# Patient Record
Sex: Female | Born: 1980
Health system: Southern US, Community
[De-identification: ages and names within clinical notes are randomized; demographics above are authoritative.]

## PROBLEM LIST (undated history)

## (undated) DIAGNOSIS — G40909 Epilepsy, unspecified, not intractable, without status epilepticus: Secondary | ICD-10-CM

## (undated) DIAGNOSIS — E785 Hyperlipidemia, unspecified: Secondary | ICD-10-CM

## (undated) DIAGNOSIS — F840 Autistic disorder: Secondary | ICD-10-CM

## (undated) DIAGNOSIS — R569 Unspecified convulsions: Secondary | ICD-10-CM

## (undated) DIAGNOSIS — K649 Unspecified hemorrhoids: Secondary | ICD-10-CM

## (undated) HISTORY — DX: Unspecified hemorrhoids: K64.9

## (undated) HISTORY — DX: Hyperlipidemia, unspecified: E78.5

## (undated) HISTORY — DX: Unspecified convulsions: R56.9

## (undated) HISTORY — DX: Autistic disorder: F84.0

## (undated) HISTORY — PX: NO PAST SURGERIES: SHX2092

## (undated) HISTORY — DX: Epilepsy, unspecified, not intractable, without status epilepticus: G40.909

---

## 2004-02-12 ENCOUNTER — Emergency Department: Payer: Self-pay | Admitting: Unknown Physician Specialty

## 2006-02-11 ENCOUNTER — Emergency Department: Payer: Self-pay | Admitting: Emergency Medicine

## 2007-08-20 DIAGNOSIS — J309 Allergic rhinitis, unspecified: Secondary | ICD-10-CM | POA: Insufficient documentation

## 2008-07-10 DIAGNOSIS — N946 Dysmenorrhea, unspecified: Secondary | ICD-10-CM | POA: Insufficient documentation

## 2009-12-31 ENCOUNTER — Ambulatory Visit: Payer: Self-pay | Admitting: Oncology

## 2010-01-22 ENCOUNTER — Ambulatory Visit: Payer: Self-pay | Admitting: Oncology

## 2010-02-21 ENCOUNTER — Ambulatory Visit: Payer: Self-pay | Admitting: Oncology

## 2010-04-04 ENCOUNTER — Ambulatory Visit: Payer: Self-pay | Admitting: Oncology

## 2010-04-24 ENCOUNTER — Ambulatory Visit: Payer: Self-pay | Admitting: Oncology

## 2010-07-11 ENCOUNTER — Ambulatory Visit: Payer: Self-pay | Admitting: Oncology

## 2010-07-23 ENCOUNTER — Ambulatory Visit: Payer: Self-pay | Admitting: Oncology

## 2010-11-26 ENCOUNTER — Ambulatory Visit: Payer: Self-pay | Admitting: Family Medicine

## 2010-12-27 ENCOUNTER — Ambulatory Visit: Payer: Self-pay | Admitting: Family Medicine

## 2012-06-23 ENCOUNTER — Other Ambulatory Visit: Payer: Self-pay

## 2012-06-23 DIAGNOSIS — G40209 Localization-related (focal) (partial) symptomatic epilepsy and epileptic syndromes with complex partial seizures, not intractable, without status epilepticus: Secondary | ICD-10-CM

## 2012-06-23 MED ORDER — ACETAZOLAMIDE 250 MG PO TABS: ORAL_TABLET | ORAL | Status: DC

## 2012-12-20 ENCOUNTER — Other Ambulatory Visit: Payer: Self-pay

## 2012-12-20 DIAGNOSIS — G40209 Localization-related (focal) (partial) symptomatic epilepsy and epileptic syndromes with complex partial seizures, not intractable, without status epilepticus: Secondary | ICD-10-CM

## 2012-12-20 MED ORDER — ACETAZOLAMIDE 250 MG PO TABS: ORAL_TABLET | ORAL | Status: DC

## 2012-12-22 ENCOUNTER — Telehealth: Payer: Self-pay

## 2012-12-22 NOTE — Telephone Encounter (Signed)
I called and recommended that she have a CBC, ALT and Tegretol level (drawn as trough level). TG

## 2012-12-22 NOTE — Telephone Encounter (Signed)
Sherri, RN with [facility] called and lvm stating that pt has an appt in our office on 12/30/12 and an appt with another provider on 12/28/12. She said that the other provider will be ordering labs. Wants to know if our provider will need labs as well.She would like to have it all done at the same time. Please cal Sherri at [phone].

## 2012-12-30 ENCOUNTER — Ambulatory Visit: Payer: Self-pay | Admitting: Family

## 2013-01-04 ENCOUNTER — Ambulatory Visit: Payer: Self-pay | Admitting: Family

## 2013-01-10 ENCOUNTER — Ambulatory Visit (INDEPENDENT_AMBULATORY_CARE_PROVIDER_SITE_OTHER): Payer: Medicaid Other | Admitting: Family

## 2013-01-10 ENCOUNTER — Encounter: Payer: Self-pay | Admitting: Family

## 2013-01-10 VITALS — BP 120/72 | HR 80 | Ht 67.0 in | Wt 154.0 lb

## 2013-01-10 DIAGNOSIS — G40209 Localization-related (focal) (partial) symptomatic epilepsy and epileptic syndromes with complex partial seizures, not intractable, without status epilepticus: Secondary | ICD-10-CM | POA: Insufficient documentation

## 2013-01-10 DIAGNOSIS — Z79899 Other long term (current) drug therapy: Secondary | ICD-10-CM

## 2013-01-10 DIAGNOSIS — F840 Autistic disorder: Secondary | ICD-10-CM | POA: Insufficient documentation

## 2013-01-10 NOTE — Patient Instructions (Signed)
Continue your medications without change.  I will mail a letter to you about Janese's movements and behavior.  Return for follow up in 1 year or sooner if needed.

## 2013-01-10 NOTE — Progress Notes (Signed)
Patient: Joanna Robinson MRN: [id number] Sex: female DOB: 1980-06-06  Provider: Elveria Rising, NP Location of Care: [facility]  Note type: Routine return visit  History of Present Illness: Referral Source: Dr. Lorie Phenix History from: mother and caregivers Chief Complaint: Seizures/Autism  Joanna Robinson is a 32 y.o. female with undifferentiated autism, and history of complex partial seizures who lives in a group home. She is seen yearly for ongoing evaluation of her condition. Major concerns in the past have been a need to perform activities at her pace which often does not conform with needs of the social groups around her. Her behavior is entirely self-directed.  Her mother is concerned because she is going through a phase of being very slow in all her activities. Her mother says that she is having tics and points out sudden flapping movements of her hands, arms and head. The movements look more like stereotypies than tics.  Since she was last seen, she has been physically healthy. She has about 3 brief seizures per year, usually associated with her menstrual cycle.   Review of Systems: 12 system review was remarkable for weight gain, weight loss, swelling in legs, constipation, easy brusing and seizure  Past Medical History  Diagnosis Date  . Seizures    Hospitalizations: no, Head Injury: no, Nervous System Infections: no, Immunizations up to date: yes Past Medical History Comments: She had pneumonia in September 2012  Surgical History No past surgical history on file.   Family History family history includes Alzheimer's disease in her maternal grandmother and paternal grandfather. Family History is negative migraines, seizures, cognitive impairment, blindness, deafness, birth defects, chromosomal disorder, autism.  Social History History   Social History  . Marital Status: Single    Spouse Name: N/A    Number of Children: N/A  . Years of  Education: N/A   Social History Main Topics  . Smoking status: Never Smoker   . Smokeless tobacco: Never Used  . Alcohol Use: No  . Drug Use: No  . Sexual Activity: No   Other Topics Concern  . None   Social History Narrative  . None   Educational level special education Occupation: Day program Living with [employer] comments [place] graduated from [employer], she currently attends [employer].  No Known Allergies  Physical Exam BP 120/72  Pulse 80  Ht 5\' 7"  (1.702 m)  Wt 154 lb (69.854 kg)  BMI 24.11 kg/m2 General: alert, well developed, well nourished female, in no acute distress. She intermittently tossed her head, flapped her arms and hands throughout the visit. Head: normocephalic, no dysmorphic features, Ears, Nose and Throat: Pharynx: oropharynx is pink without exudates or tonsillar hypertrophy. Neck: supple, full range of motion, no cranial or cervical bruits Respiratory: auscultation clear Cardiovascular: no murmurs, pulses are normal Musculoskeletal: no skeletal deformities or apparent scoliosis Skin: no rashes or neurocutaneous lesions  Neurologic Exam  Mental Status: alert; oriented to person; knowledge is  below normal for age; language is pragmatic and somewhat echolalic. She is able to convey her thoughts.  She will repeat a phrase over over again until she has acknowledged. Cranial Nerves: visual fields are full to double simultaneous stimuli; extraocular movements are full and conjugate; pupils are round reactive to light; funduscopic examination shows sharp disc margins with normal vessels; symmetric facial strength; midline tongue and uvula; hearing is functionally normal Motor: Normal strength, tone, and mass; good fine motor movements Sensory: withdrawal x 4 Coordination: had  difficulty following instructions but coordination seems adequate.  Gait and Station: normal gait and station. She is slightly stiff in her  posture, but no evidence of hemiparesis or circumduction. Balance is adequate Reflexes: symmetric and diminished bilaterally; no clonus, toes downgoing   Assessment and Plan Joanna Robinson is a 32 year old young woman with undifferentiated autism, and history of complex partial seizures who lives in a group home. Her mother is concerned because her behavior is slow and deliberate. She has tasks to complete at the group home, and her mother asked for a letter to give to the caregivers there to explain that Joanna Robinson's autism may dictate the speed at which she completes activities, at a pace of her choosing. I told her mother that I would write the letter and mail it to her.  Levada will return for follow up in one year or sooner if needed.

## 2013-01-11 ENCOUNTER — Telehealth: Payer: Self-pay | Admitting: *Deleted

## 2013-01-11 NOTE — Telephone Encounter (Signed)
Noted, I agree with this plan.  This was discussed with Joanna Robinson prior to my leaving.

## 2013-01-11 NOTE — Telephone Encounter (Signed)
[name] the patient's mom called and stated that Dr. Leanna Battles informed her that Joanna Robinson's Tegretol level was high and it was higher than he thought it should be, also Dr. Ave Filter increased her Luvox because the patient had been moving really slow and Dr. Ave Filter felt the increase would help, mom is concerned and would like to know if the increase in Luvox could have caused the Tegretol level to be high? Mom can be reached at work [phone] she states to let them know that you're calling from her daughter's doctor's office and they will pull her out of class. I called mom and her job stated she was off campus until 11:20 am       Thanks,  Marcelino Duster B.

## 2013-01-11 NOTE — Telephone Encounter (Signed)
I spoke with Joanna Robinson and informed her per Dr. Sharene Skeans that we have not seen any labs come over from Dr. Santiago Bur office and that he can not make a decision until seeing what her actual levels are, he also stated that if her Tegretol level is not a 15 or greater or if she's no having unsteady gait, vomiting or double vision, which her mom confirmed that she was not having any of those symptoms, that he would not make any changes and also that the increase in Luvox would not cause a high Tegretol level, and if the level is 15 or higher once I receive a copy from Dr. Posey Boyer office to get with Dr. Merri Brunette about it, mom confirmed understanding and agreed with this plan of care. I have just received the labs from Dr. Santiago Bur office and the Tegretol level is 12.3, this will be in Dr. Darl Householder bin in his office for his review once he returns.          Thanks,  Belenda Cruise.

## 2013-01-18 ENCOUNTER — Encounter: Payer: Self-pay | Admitting: Family

## 2013-01-20 ENCOUNTER — Other Ambulatory Visit: Payer: Self-pay

## 2013-01-20 DIAGNOSIS — G40209 Localization-related (focal) (partial) symptomatic epilepsy and epileptic syndromes with complex partial seizures, not intractable, without status epilepticus: Secondary | ICD-10-CM

## 2013-01-20 MED ORDER — CARBAMAZEPINE ER 300 MG PO CP12: 300.0000 mg | ORAL_CAPSULE | Freq: Two times a day (BID) | ORAL | Status: DC

## 2013-01-20 MED ORDER — ACETAZOLAMIDE 250 MG PO TABS: ORAL_TABLET | ORAL | Status: DC

## 2013-01-20 MED ORDER — CARBAMAZEPINE ER 200 MG PO CP12: 200.0000 mg | ORAL_CAPSULE | Freq: Two times a day (BID) | ORAL | Status: DC

## 2013-01-31 ENCOUNTER — Telehealth: Payer: Self-pay

## 2013-01-31 NOTE — Telephone Encounter (Signed)
I called [name] and let her know the fax was sent as requested.

## 2013-01-31 NOTE — Telephone Encounter (Signed)
[name], mom lvm stating that she spoke with Inetta Fermo this morning regarding a letter. She asked that the letter be faxed to her attention, Sharion Dove, at [phone]. She would also like a call letting her know when this is faxed [phone]

## 2013-01-31 NOTE — Telephone Encounter (Signed)
Please let Mom know that the letter has been faxed. Thanks, Inetta Fermo

## 2013-03-06 IMAGING — CR DG CHEST 2V
1 series · 2 of 2 positions shown · non-contrast
Comparison: none

REASON FOR EXAM: cough
COMMENTS:

PROCEDURE:     KDR - KDXR CHEST PA (OR AP) AND LAT  - November 26, 2010 [DATE]
RESULT:     Comparison: None.

[Series 1: view not recorded · 0.17mm/px · 2 of 2 slices shown]
[im 1/2]
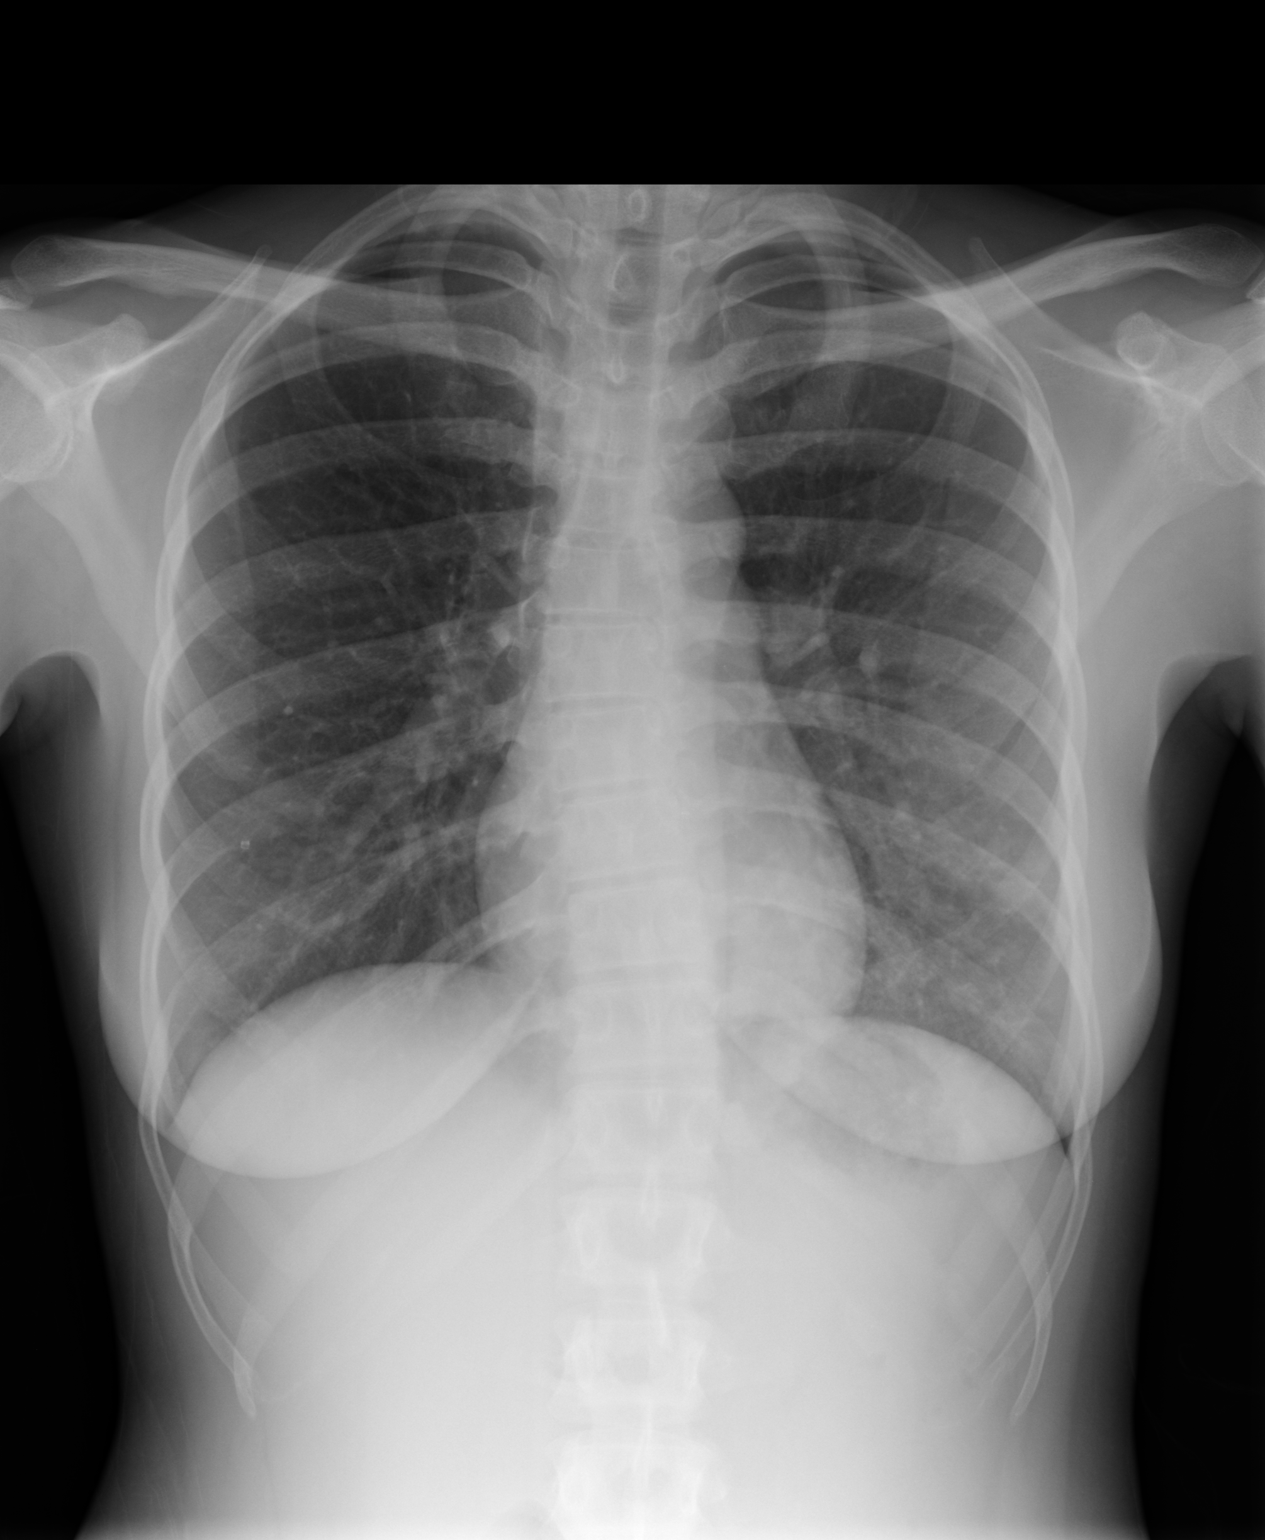
[im 2/2]
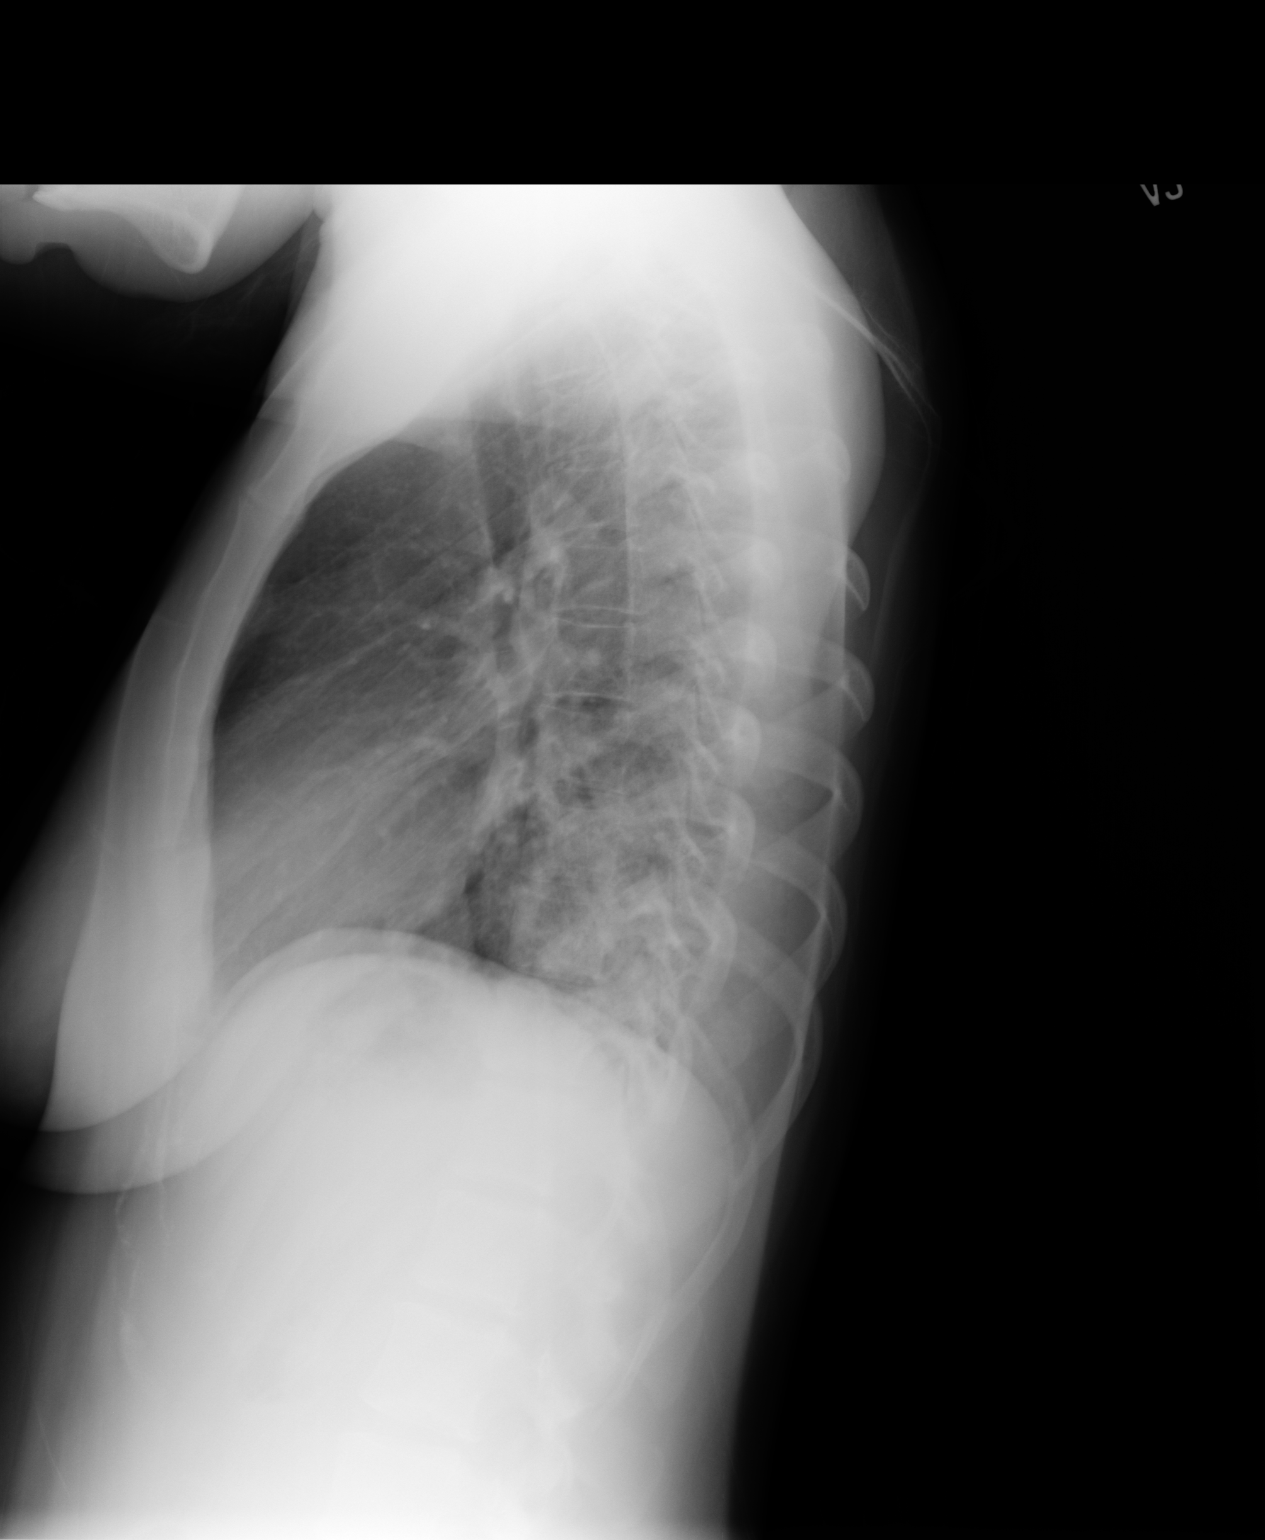

[2 of 2 positions shown; findings below may reference images not displayed]

FINDINGS: Heart and mediastinum are within normal limits. There are heterogeneous
opacities in the left lower lobe. The right lung is clear.
IMPRESSION: Left lower lobe pneumonia.

## 2013-07-19 ENCOUNTER — Other Ambulatory Visit: Payer: Self-pay

## 2013-07-19 DIAGNOSIS — G40209 Localization-related (focal) (partial) symptomatic epilepsy and epileptic syndromes with complex partial seizures, not intractable, without status epilepticus: Secondary | ICD-10-CM

## 2013-07-19 MED ORDER — ACETAZOLAMIDE 250 MG PO TABS: ORAL_TABLET | ORAL | Status: DC

## 2013-07-19 MED ORDER — CARBAMAZEPINE ER 200 MG PO CP12: 200.0000 mg | ORAL_CAPSULE | Freq: Two times a day (BID) | ORAL | Status: DC

## 2013-07-19 MED ORDER — CARBAMAZEPINE ER 300 MG PO CP12: 300.0000 mg | ORAL_CAPSULE | Freq: Two times a day (BID) | ORAL | Status: DC

## 2013-07-25 DIAGNOSIS — Z23 Encounter for immunization: Secondary | ICD-10-CM | POA: Diagnosis not present

## 2013-07-25 DIAGNOSIS — R609 Edema, unspecified: Secondary | ICD-10-CM | POA: Diagnosis not present

## 2013-07-25 DIAGNOSIS — K5909 Other constipation: Secondary | ICD-10-CM | POA: Diagnosis not present

## 2013-07-25 DIAGNOSIS — R569 Unspecified convulsions: Secondary | ICD-10-CM | POA: Diagnosis not present

## 2013-07-25 DIAGNOSIS — H04129 Dry eye syndrome of unspecified lacrimal gland: Secondary | ICD-10-CM | POA: Diagnosis not present

## 2013-07-26 ENCOUNTER — Telehealth: Payer: Self-pay | Admitting: Family

## 2013-07-26 DIAGNOSIS — R569 Unspecified convulsions: Secondary | ICD-10-CM | POA: Diagnosis not present

## 2013-07-26 DIAGNOSIS — R609 Edema, unspecified: Secondary | ICD-10-CM | POA: Diagnosis not present

## 2013-07-26 NOTE — Telephone Encounter (Signed)
[name] called to report that Joanna Robinson had a seizure yesterday, on last day of her menstrual cycle. She took her to her PCP and asked for labs to be drawn. Labs were drawn today and they will fax results. Mom also received a letter from her Medicare Part D insurance, [employer], saying that her Carbatrol requires prior authorization. I called Mom back and told her that I would watch for the lab results. I told her that I had received the same letter and would work on the prior authorization for the medication. TG

## 2013-07-27 ENCOUNTER — Telehealth: Payer: Self-pay | Admitting: *Deleted

## 2013-07-27 NOTE — Telephone Encounter (Signed)
I left a message for Mom that I will call her tomorrow. [facility] the [facility] from [facility]. The Carbamazepine level is 15.6%. Comprehensive Metabolic Panel has all normal values, including ALT of 13. CBC shows WBC of 3.5, RBC of 4.57, Hgb of 14.0, Hct of 41.3, MCV of 90, Platelet ct of 152,000, ANC of 2000. The lab report indicates that the patient was fasting but I will need to verify that the patient had not taken her medication that morning. Her previous trough levels have been high, usually averaging at around 12.9 %. TG

## 2013-07-27 NOTE — Telephone Encounter (Signed)
Joanna Robinson, mom called at 3:45 pm and left message about Joanna Robinson's lab work. Dr. Santiago BurMaloney's office about her lab work. She mentioned that her Tegretol level is high in the lab results, everything else was fine.She is concerned about this. She has to take fluid pills. She mentioned that she has to check her blood pressure daily and today it was 103/71. Dr. Santiago BurMaloney's office faxed the pt's lab work a few minutes ago. She also wants to know if she should go back to work tomorrow? Joanna LagerYolanda can be reached at [phone]

## 2013-07-28 NOTE — Telephone Encounter (Signed)
I called Joanna Robinson and talked with her about the lab results. She said that Joanna Robinson had not taken her medication the morning of the lab draw. She said that she seemed fine, no apparent side effects. She had a brief seizure on 07/25/13 that was associated with her menstrual cycle as she has had in the past. Joanna Robinson is not sure what time she took her medications the night before the lab draw and I talked with her about the fact that the Carbatrol is extended release and that may affect the lab result. Since Joanna Robinson is not symptomatic and doing well, I recommended that she continue her medications without change for now. We also talked about her blood pressure. Joanna Robinson is very concerned that her BP is averaging low 100's over low 70's. She said that Joanna Robinson seems fine, is not symptomatic, has not been syncopal or fatigued. She said that Joanna Robinson had been having lower extremity edema and because of her low BP, her PCP changed her from HCTZ to Lasix. I attempted to reassure Joanna Robinson about Joanna Robinson's BP since she is not symptomatic at this time. I told her that Joanna Robinson should be drinking fluids during the day and she agreed, and will relay that information to the group home. TG

## 2013-07-28 NOTE — Telephone Encounter (Signed)
See next phone note regarding lab results. I have received authorization for Carbatrol 200mg  and am awaiting authorization for Carbatrol 300mg . Mom is aware. TG

## 2013-07-29 NOTE — Telephone Encounter (Signed)
If this history is true, I'm concerned that she may be in the toxic range.  I know that it isn't easy to get her blood, but I think that we should have careful monitoring of her intake of the med for one week and a repeat test.  Am trough CBZ.  BP is fine.  I'm concerned that the patient is on diuretics, has she had a comprehensive metabolic panel to look at her protein?  Could she be having lymphedema?  It's unlikely that she has CHF.  Can you get up with Dr. Paschal DoppMaroney's office and find this out or do you want me to call?

## 2013-07-29 NOTE — Telephone Encounter (Signed)
I [facility] a [facility] as well as with [facility]. I would like to talk with her nurse at the facility. TG

## 2013-07-29 NOTE — Telephone Encounter (Signed)
Dr Elease HashimotoMaloney called me back and said that the edema in Carinne's legs was very mild, lower legs only, likely from Janit's tendency to sit with legs dependent at all times. She said that Mom was very upset about it, monitored it compulsively, and that she gave her low dose diuretics at Mom's insistence. The leg edema has been presence for years and has not changed. She said that [name] has always tolerated the diuretics and has not had changes in her BP's or her electrolytes. As for the Carbamazepine level, Dr Elease HashimotoMaloney was told that [name] had taken her medication that morning. She said that [name] was fine on day of examination, no signs of toxicity. I will await call back from [facility] group home to gather more information. TG

## 2013-08-02 NOTE — Telephone Encounter (Signed)
[name], nurse [facility] called me this afternoon. She was not present when the blood was drawn and is unsure if the medication was held. She said that one night that week, the medication was given late because of the prom that the residents attended. She will check on the medication record and call me back tomorrow. She said that Joanna Robinson has been asymptomatic and behaving normally. TG

## 2013-08-03 NOTE — Telephone Encounter (Signed)
[name], nurse, called at 9:50 am. She is returning your call. She has received her carbatrol medicine every day in the month of may and end of  07/19/13 through present it was late on 07/20/13. The pt was given her meds at 7 am and 8 pm. There pt did receive her medicine. If you have a question she can reached [phone].

## 2013-08-03 NOTE — Telephone Encounter (Signed)
I called and talked with [name], RN. Because the Carbatrol was taken the morning of the blood drawn the level obtained was not a trough level. [name] remains asymptomatic. I relayed this information to Dr Sharene SkeansHickling. We will make no changes in her treatment plan at this time. TG

## 2013-09-07 DIAGNOSIS — G40909 Epilepsy, unspecified, not intractable, without status epilepticus: Secondary | ICD-10-CM | POA: Diagnosis not present

## 2013-09-07 DIAGNOSIS — Z23 Encounter for immunization: Secondary | ICD-10-CM | POA: Diagnosis not present

## 2013-09-07 DIAGNOSIS — F840 Autistic disorder: Secondary | ICD-10-CM | POA: Diagnosis not present

## 2013-09-07 DIAGNOSIS — R609 Edema, unspecified: Secondary | ICD-10-CM | POA: Diagnosis not present

## 2013-09-07 DIAGNOSIS — H04129 Dry eye syndrome of unspecified lacrimal gland: Secondary | ICD-10-CM | POA: Diagnosis not present

## 2013-09-29 DIAGNOSIS — G40909 Epilepsy, unspecified, not intractable, without status epilepticus: Secondary | ICD-10-CM | POA: Diagnosis not present

## 2013-09-29 DIAGNOSIS — R609 Edema, unspecified: Secondary | ICD-10-CM | POA: Diagnosis not present

## 2013-09-29 DIAGNOSIS — J069 Acute upper respiratory infection, unspecified: Secondary | ICD-10-CM | POA: Diagnosis not present

## 2013-09-29 DIAGNOSIS — H04129 Dry eye syndrome of unspecified lacrimal gland: Secondary | ICD-10-CM | POA: Diagnosis not present

## 2013-09-29 DIAGNOSIS — Z23 Encounter for immunization: Secondary | ICD-10-CM | POA: Diagnosis not present

## 2013-11-03 ENCOUNTER — Telehealth: Payer: Self-pay | Admitting: Family

## 2013-11-03 NOTE — Telephone Encounter (Addendum)
Mom Sharion DoveYolanda Johnson left a message saying that she needed a letter for [name] to get services for her. I called Mom back and she said she had removed [facility]ce from [facility] because she disagreed with their care and procedures there. She said that she wanted to care for her at home, and that was working out ok. She had a personal care aide for [name] for 2 1/2 hours a day and had been told that she could qualify for more hours per day if she had a letter explaining Lind's autism, and that her behavior was self-directed and slow, and that she could not be hurried. I told Mom that I would mail the letter to her. Mom agreed with this plan. Mom can be reached at [phone]. TG

## 2013-11-03 NOTE — Telephone Encounter (Signed)
Thank you for writing this letter.

## 2013-11-03 NOTE — Telephone Encounter (Signed)
Mom called back moments after our conversation and asked for the letter to also be faxed to [facility], Attention Ranelle OysterLisa Archie @ fax number [phone]. Letter faxed and mailed as requested. TG

## 2013-11-10 ENCOUNTER — Telehealth: Payer: Self-pay | Admitting: *Deleted

## 2013-11-10 NOTE — Telephone Encounter (Signed)
I called Joanna Robinson and she said that she needed a note with more information about [name]'s information faxed to her at [phone]. I have faxed her a letter. TG

## 2013-11-10 NOTE — Telephone Encounter (Signed)
Noted, thank you

## 2013-11-10 NOTE — Telephone Encounter (Signed)
Ranelle OysterLisa Archie of [facility] stated she is trying to submit additional hours with their company. She is requesting medical documentation to support the frequency and type of seizures that she has. She can be reached at her office # [phone] or her personal cell # [phone].

## 2014-01-10 ENCOUNTER — Ambulatory Visit (INDEPENDENT_AMBULATORY_CARE_PROVIDER_SITE_OTHER): Payer: Medicare Other | Admitting: Family

## 2014-01-10 ENCOUNTER — Encounter: Payer: Self-pay | Admitting: Family

## 2014-01-10 VITALS — BP 124/76 | HR 82 | Wt 169.0 lb

## 2014-01-10 DIAGNOSIS — G40209 Localization-related (focal) (partial) symptomatic epilepsy and epileptic syndromes with complex partial seizures, not intractable, without status epilepticus: Secondary | ICD-10-CM

## 2014-01-10 DIAGNOSIS — Z79899 Other long term (current) drug therapy: Secondary | ICD-10-CM

## 2014-01-10 DIAGNOSIS — F840 Autistic disorder: Secondary | ICD-10-CM

## 2014-01-10 NOTE — Patient Instructions (Signed)
Continue Joanna Robinson's medications without change.  Let me know if her seizures become more frequent.   I will write a letter for your employer and mail it to you.   Please plan to return for follow up in 1 year or sooner if needed.

## 2014-01-10 NOTE — Progress Notes (Signed)
Patient: Joanna Robinson MRN: [id number] Sex: female DOB: 09-18-1980  Provider: Elveria RisingGOODPASTURE, Ziaire Hagos, NP [facility]  Note type: Routine return visit  History of Present Illness: Referral Source: Dr. Lorie PhenixNancy Maloney [facility] Chief Complaint: seizures and autism, problems with eyes staying red and slowness  Joanna Robinson is a 33 y.o. woman with history of undifferentiated autism, and history of complex partial seizures. She was last seen January 10, 2013. [name] used to live in a group home, but since she was last seen, her mother brought her home to live, because she was concerned that the group home was not tolerant of Brandii's slow behavior. Her behavior is entirely self-directed and she cannot be hurried. Her mother has questions today about her behaviors and worries that her behavior indicates that she has a serious neurologic disorder other than autism. She is also concerned about her flapping movements and wonders if this is a type of tremor or other disorder. Her mother worries that her slow movements means that she she is overly tired and that she has a physical ailment making her slow and tired. As an example, when [name] was called from the lobby to the exam room, it took her nearly 20 minutes to choose to leave the lobby and walk from the lobby to the exam room. She was cooperative with standing on the scale to obtain her weight, but only when she arrived at the scale, looked at the scale for several minutes, then decided to stand on it. Any effort to get her to walk more quickly or do any other behavior resulted in her stopping, flapping her hands and arms, looking up at the ceiling and not walking until she chose to do so. When she entered the exam room, I was in the room talking with her mother, and [name] walked in fairly briskly at that point and said in a mechanical voice "Hello Inetta Fermoina time for check up" and stood in  front of me. I greeted her and showed her the stethoscope and blood pressure cuff, to which she named the objects, and presented her arm to me. I asked her to sit on the exam table and she did, and was fairly cooperative for a brief examination. As soon as I stopped, she closed her eyes and began humming. Her mother then said to me "see, she is so tired she can't keep her eyes open". When I touched [name] to see if she would respond, she stopped humming and said "Hello Omeed Osuna Happy Halloween", and began flapping her arms, then hopped off the table and paced back and forth in the room repeating the words "Halloween party".  Mom tells me that [name] sleeps very little. She says that she is quiet at night, sometimes sitting and looking around the room, sometimes looking at a book or magazine, but that she sleeps very little. She said that this has been her pattern of behavior since she was a young child.  Since she was last seen, she has been generally physically healthy. She has about 2 or 3 brief seizures per year, usually associated with her menstrual period. Mom says that she sometimes says that word headache but that she has no outward sign of headache, and if offered medication for it will refuse, so it is unclear if she truly has a headache or is simply repeating a word. Mom feels that she says the word headache more often just before her menstrual period. She  says that during the period, that [name] will often clutch her abdomen and cry, so it is thought that she is having menstrual cramps. Mom said that hormonal suppression of the period was suggested to her but she is fearful of doing so.   Finally, Mom asked if I would write a letter to her employer explaining [name]'s condition as she has missed a significant amount of work since Charline has been at home with her.   Review of Systems: 12 system review was unremarkable  Past Medical History  Diagnosis Date  . Seizures     Hospitalizations: No., Head Injury: No., Nervous System Infections: No., Immunizations up to date: Yes.   Past Medical History Comments: See hx  Surgical History No past surgical history on file.  Family History family history includes Alzheimer's disease in her maternal grandmother and paternal grandfather. Family History is otherwise negative for migraines, seizures, cognitive impairment, blindness, deafness, birth defects, chromosomal disorder, autism.  Social History History   Social History  . Marital Status: Single    Spouse Name: N/A    Number of Children: N/A  . Years of Education: N/A   Social History Main Topics  . Smoking status: Never Smoker   . Smokeless tobacco: Never Used  . Alcohol Use: No  . Drug Use: No  . Sexual Activity: No   Other Topics Concern  . Not on file   Social History Narrative  . No narrative on file   Educational level: 12th grade Living with:  mother    Physical Exam BP 124/76  Pulse 82  Wt 169 lb (76.658 kg)  LMP 12/08/2013 General: alert, well developed, well nourished female, in no acute distress. She intermittently tossed her head, flapped her arms and hands throughout the visit.  Head: normocephalic, no dysmorphic features,  Ears, Nose and Throat: Pharynx: oropharynx is pink without exudates or tonsillar hypertrophy.  Neck: supple, full range of motion, no cranial or cervical bruits  Respiratory: auscultation clear  Cardiovascular: no murmurs, pulses are normal  Musculoskeletal: no skeletal deformities or apparent scoliosis  Skin: no rashes or neurocutaneous lesions   Neurologic Exam  Mental Status: alert; oriented to person; knowledge is below normal for age; language is pragmatic and somewhat echolalic. She will repeat a phrase over over again until she has acknowledged. She was fairly cooperative for examination.  Cranial Nerves: visual fields appear full but examination was limited by patient's inability to fully  cooperate; extraocular movements are full and conjugate; pupils are round reactive to light; funduscopic examination shows sharp disc margins with normal vessels; symmetric facial strength; midline tongue and uvula; hearing is functionally normal  Motor: Normal strength, tone, and mass; good fine motor movements  Sensory: withdrawal x 4  Coordination: had difficulty following instructions but coordination seems adequate.  Gait and Station: normal gait and station. She is slightly stiff in her posture, but no evidence of hemiparesis or circumduction. Balance is adequate Reflexes: symmetric and diminished bilaterally; no clonus, toes downgoing   Assessment and Plan Modest is a 33 year old young woman with undifferentiated autism, and history of complex partial seizures. She has occasional brief seizures usually associated with her menstrual period. Her mother has removed Ottilie from the group home and taken her back home with her to live. Her mother is very worried that Takera's self directed behavior and steroptypies indicate that she has an ominous disorder or some other disorder making her very slow and tired. I talked with her mother and reviewed  behaviors frequently seen with persons on the autism spectrum, and reassured her that when [name] chooses to move, that her movements are normal and that from her examination today, [name] appears otherwise healthy. I attempted to answer Mom's questions and reassure her. I will write a letter for Mom's employer and mail it to her. When Margaretha has blood drawn at her PCP office again, I would like for her to have a CBC and ALT drawn. If a Carbamazepine level is drawn, it should only be drawn as a trough level. [name] will otherwise continue on her medications without change and will return for follow up in one year or sooner if needed.

## 2014-01-11 MED ORDER — CARBAMAZEPINE ER 300 MG PO CP12: 300.0000 mg | ORAL_CAPSULE | Freq: Two times a day (BID) | ORAL | Status: DC

## 2014-01-11 MED ORDER — CARBAMAZEPINE ER 200 MG PO CP12: 200.0000 mg | ORAL_CAPSULE | Freq: Two times a day (BID) | ORAL | Status: DC

## 2014-01-17 ENCOUNTER — Other Ambulatory Visit: Payer: Self-pay

## 2014-01-17 DIAGNOSIS — G40209 Localization-related (focal) (partial) symptomatic epilepsy and epileptic syndromes with complex partial seizures, not intractable, without status epilepticus: Secondary | ICD-10-CM

## 2014-01-17 MED ORDER — ACETAZOLAMIDE 250 MG PO TABS: ORAL_TABLET | ORAL | Status: DC

## 2014-01-24 LAB — HEPATIC FUNCTION PANEL
ALT: 12 U/L (ref 7–35)
AST: 20 U/L (ref 13–35)

## 2014-01-24 LAB — BASIC METABOLIC PANEL
BUN: 12 mg/dL (ref 4–21)
Creatinine: 0.8 mg/dL (ref 0.5–1.1)
Glucose: 94 mg/dL
POTASSIUM: 3.7 mmol/L (ref 3.4–5.3)
SODIUM: 136 mmol/L — AB (ref 137–147)

## 2014-01-24 LAB — CBC AND DIFFERENTIAL
HCT: 195 % — AB (ref 36–46)
HEMOGLOBIN: 14 g/dL (ref 12.0–16.0)
Platelets: 195 10*3/uL (ref 150–399)

## 2014-01-24 LAB — TSH: TSH: 2.92 u[IU]/mL (ref 0.41–5.90)

## 2014-01-25 ENCOUNTER — Telehealth: Payer: Self-pay | Admitting: Family

## 2014-01-25 DIAGNOSIS — G40209 Localization-related (focal) (partial) symptomatic epilepsy and epileptic syndromes with complex partial seizures, not intractable, without status epilepticus: Secondary | ICD-10-CM

## 2014-01-25 DIAGNOSIS — Z79899 Other long term (current) drug therapy: Secondary | ICD-10-CM

## 2014-01-25 DIAGNOSIS — R7889 Finding of other specified substances, not normally found in blood: Secondary | ICD-10-CM

## 2014-01-25 NOTE — Telephone Encounter (Signed)
Mom Sharion DoveYolanda Johnson called and said that she was called [facility] that Joanna Robinson's Carbamazepine level was very high at 18.3 mcg/ml. Mom said that the level was a trough level, and the prior dose was given at 8pm the night before. [name] is taking Carbatrol 500mg  twice per day, using 200mg  and 300mg  capsules to obtain this dose. Mom said that she was not having seizures, that she was somewhat sleepy during the day but she is also not sleeping at night, as is usual for her. She is not staggering when she walks. Her behavior is not different that it has been. I asked Mom to have her PCP fax the lab report to me, and then I will call her back. TG

## 2014-01-25 NOTE — Telephone Encounter (Signed)
[facility] which confirmed the Carbamazepine level of 18.553mcg/ml. I discussed this drug level with Dr Sharene SkeansHickling and will reduce her morning dose to Carbatrol 400mg  and will keep the evening dose unchanged for now. I am reluctant to reduce the dose too quickly as Joanna Robinson's prior levels have been around 12-13 to maintain her seizure control. I will [facility] a [facility] to recheck her Carbamazepine level in 1 week. I called Mom and explained the plan. She agreed and verbalized understanding. I will update her Rx after I receive her next blood level as we may need to change the dose again. Mom said her Rx will need to be refilled at the end of the month. TG

## 2014-02-06 ENCOUNTER — Telehealth: Payer: Self-pay | Admitting: Family

## 2014-02-06 DIAGNOSIS — G40209 Localization-related (focal) (partial) symptomatic epilepsy and epileptic syndromes with complex partial seizures, not intractable, without status epilepticus: Secondary | ICD-10-CM

## 2014-02-06 MED ORDER — CARBATROL 200 MG PO CP12: ORAL_CAPSULE | ORAL | Status: DC

## 2014-02-06 NOTE — Telephone Encounter (Signed)
Mom called in and said that Sarin needed refill today on Carbatrol 200mg  from recent change in dosage. She asked for me to tell the pharmacy ([facility]) to deliver it to her home today as [name] is out of of medication. Also, Mom needs a note faxed to her employer stating that she needs to take [name] to see her PCP this week on Weds and then return on Thursday for a blood test. She asked for the note to be faxed to [phone], attention: Karmen StabsEmmett  Alexander. I will send in the Rx and the note for Mom's employer. TG

## 2014-02-08 DIAGNOSIS — R569 Unspecified convulsions: Secondary | ICD-10-CM | POA: Diagnosis not present

## 2014-02-10 ENCOUNTER — Telehealth: Payer: Self-pay | Admitting: *Deleted

## 2014-02-10 DIAGNOSIS — G40209 Localization-related (focal) (partial) symptomatic epilepsy and epileptic syndromes with complex partial seizures, not intractable, without status epilepticus: Secondary | ICD-10-CM

## 2014-02-10 MED ORDER — CARBATROL 300 MG PO CP12: ORAL_CAPSULE | ORAL | Status: DC

## 2014-02-10 MED ORDER — CARBATROL 200 MG PO CP12: ORAL_CAPSULE | ORAL | Status: DC

## 2014-02-10 NOTE — Telephone Encounter (Signed)
The [facility]. I called Mom and left her a message to let her know. TG

## 2014-02-10 NOTE — Telephone Encounter (Signed)
Joanna Robinson, mom, stated she received a call from Dr. Santiago BurMaloney's office. They told her that the pt's lab results came back normal. The doctor's office will be faxing the lab results today. The mother is also requesting a refill for the blue pills, turquoise pills and grey pills(?); has enough until Sunday. She said that to let the pharmacy know that the pt's morning medication has been changed. She needs, if you want, the turquoise and grey pills. I left a message at 9:05 am to the mother to let me know the names of the Rx's she needs. The mother can be reached at [phone].

## 2014-05-05 DIAGNOSIS — J019 Acute sinusitis, unspecified: Secondary | ICD-10-CM | POA: Diagnosis not present

## 2014-05-05 DIAGNOSIS — J309 Allergic rhinitis, unspecified: Secondary | ICD-10-CM | POA: Diagnosis not present

## 2014-05-20 DIAGNOSIS — J309 Allergic rhinitis, unspecified: Secondary | ICD-10-CM | POA: Diagnosis not present

## 2014-05-20 DIAGNOSIS — B309 Viral conjunctivitis, unspecified: Secondary | ICD-10-CM | POA: Diagnosis not present

## 2014-06-12 DIAGNOSIS — J309 Allergic rhinitis, unspecified: Secondary | ICD-10-CM | POA: Diagnosis not present

## 2014-06-12 DIAGNOSIS — L0291 Cutaneous abscess, unspecified: Secondary | ICD-10-CM | POA: Diagnosis not present

## 2014-06-20 ENCOUNTER — Ambulatory Visit (INDEPENDENT_AMBULATORY_CARE_PROVIDER_SITE_OTHER): Payer: Medicare Other | Admitting: General Surgery

## 2014-06-20 ENCOUNTER — Encounter: Payer: Self-pay | Admitting: General Surgery

## 2014-06-20 VITALS — BP 112/68 | HR 82 | Resp 14 | Ht 67.0 in | Wt 169.0 lb

## 2014-06-20 DIAGNOSIS — L02412 Cutaneous abscess of left axilla: Secondary | ICD-10-CM | POA: Diagnosis not present

## 2014-06-20 MED ORDER — DOXYCYCLINE HYCLATE 100 MG PO CAPS: 100.0000 mg | ORAL_CAPSULE | Freq: Two times a day (BID) | ORAL | Status: DC

## 2014-06-20 NOTE — Progress Notes (Signed)
Patient ID: Joanna Robinson, female   DOB: [birth year], 34 y.o.   MRN: [id number]  Chief Complaint  Patient presents with  . Other    left arm abscess    HPI RAQUELL Joanna Robinson is a 34 y.o. female here today for a evaluation of a left axiaalry abscess.Patient mom states the area has been there for 2 weeks and the area is starting to drain now. Pt is autistic but cooperated well with eval and treatment  HPI  Past Medical History  Diagnosis Date  . Seizures   . Autism   . Epilepsy   . Hyperlipidemia   . Hemorrhoid     History reviewed. No pertinent past surgical history.  Family History  Problem Relation Age of Onset  . Alzheimer's disease Maternal Grandmother     Died at 41  . Alzheimer's disease Paternal Grandfather     Died at 21    Social History History  Substance Use Topics  . Smoking status: Never Smoker   . Smokeless tobacco: Never Used  . Alcohol Use: No    Allergies  Allergen Reactions  . Codeine   . Corticosteroids Nausea Only    Current Outpatient Prescriptions  Medication Sig Dispense Refill  . acetaminophen (TYLENOL) 500 MG tablet Take 500 mg by mouth every 6 (six) hours as needed for pain.    Marland Kitchen acetaZOLAMIDE (DIAMOX) 250 MG tablet Take 1 tab by mouth twice daily 60 tablet 5  . Alpha-D-Galactosidase (BEANO) TABS Take by mouth as needed (2-3 Tabs as needed prior to meal that may cause bloating.).    Marland Kitchen benzoyl peroxide 5 % gel Apply 5 % topically daily. Apply to face every morning.    . Ca Carbonate-Mag Hydroxide (MYLANTA ULTRA) 700-300 MG CHEW Chew by mouth 3 (three) times daily. Chew 1 by mouth three times daily as needed for abdominal pain.    . calcium carbonate (TUMS E-X 750) 750 MG chewable tablet Chew 2 tablets by mouth 4 (four) times daily. 2 by mouth four times daily for indigestion.    Marland Kitchen CARBATROL 200 MG 12 hr capsule Give Feiga 2 capsules in the morning and 1 capsule in the evening 90 capsule 5  . CARBATROL 300 MG 12 hr capsule Take 1  capsule at bedtime along with the Carbatrol  capsule 30 capsule 5  . Dextromethorphan-Menthol (DELSYM COUGH RELIEF MT) Use as directed in the mouth or throat as needed (2 Tsp. by mouth every 12 hours as needed for cough.).    Marland Kitchen erythromycin ophthalmic ointment 1 application 2 (two) times daily as needed (Apply to affected area twicw daily PRN).    . fexofenadine (ALLEGRA) 180 MG tablet Take 180 mg by mouth daily. 1 By mouth every night at bedtime for allergies.    . fluvoxaMINE (LUVOX) 50 MG tablet Take 50 mg by mouth 2 (two) times daily.    . hydrochlorothiazide (HYDRODIURIL) 25 MG tablet Take 25 mg by mouth daily. 1 by mouth as needed for swelling.    . hydrocortisone (ANUSOL-HC) 2.5 % rectal cream Place 1 application rectally 2 (two) times daily.    . hydrocortisone (ANUSOL-HC) 25 MG suppository Place 25 mg rectally as needed for hemorrhoids.    Marland Kitchen omega-3 acid ethyl esters (LOVAZA) 1 G capsule Take 1 g by mouth daily.    Marland Kitchen omega-3 fish oil (MAXEPA) 1000 MG CAPS capsule Take 1 capsule by mouth daily. 1 Cap every morning with food.    Bertram Gala Glycol-Propyl Glycol (SYSTANE PRESERVATIVE  FREE) 0.4-0.3 % SOLN Apply to eye. mOne drop in each eye every night before bedtime.    . polyethylene glycol powder (MIRALAX) powder Take 17 g by mouth daily. 1 Cap dissolved in 8 oz. Glass of water q hs.    . potassium chloride (KLOR-CON) 20 MEQ packet Take 20 mEq by mouth 2 (two) times daily. 2 Packs in water on days when she uses Hydrochlorothiazide.    . sodium chloride (OCEAN) 0.65 % nasal spray Place 1 spray into the nose as needed for congestion.    . sodium fluoride (DENTA 5000 PLUS) 1.1 % CREA dental cream Place 1 application onto teeth every evening. Brush teeth daily for 1 minute, rinse and spit.    . TRIAMCINOLONE ACETONIDE, TOP, 0.05 % OINT Apply topically 3 (three) times daily. Apply to rash as needed TID.    Marland Kitchen. Witch Hazel (TUCKS) 50 % PADS Apply 50 % topically as needed (As needed for cleansing  and hemorrhoid discomfort.).    Marland Kitchen. doxycycline (VIBRAMYCIN) 100 MG capsule Take 1 capsule (100 mg total) by mouth 2 (two) times daily. 10 capsule 0   No current facility-administered medications for this visit.    Review of Systems Review of Systems  Constitutional: Negative.   Respiratory: Negative.     Blood pressure 112/68, pulse 82, resp. rate 14, height 5\' 7"  (1.702 m), weight 169 lb (76.658 kg), last menstrual period 06/13/2014.  Physical Exam Physical Exam  Constitutional: She appears well-developed and well-nourished.  Pt slow to respond but cooperative  Lymphadenopathy:    She has no cervical adenopathy.    She has no axillary adenopathy.  2.5 cm abscess in the left axilla with pin point opening in the middle with a drop of pus. No redness or induration.    With consent area was prepped and 1ml 1% xylocaine infiltrated. 1cm incision made and pus and cyst contents drained.  No immediate problem from procedure Data Reviewed    Assessment    Infected skin cyst left axilla.      Plan    5 day course of Doxycycline.Recheck in 3 weeks       Rayvon Dakin G 06/21/2014, 4:01 PM

## 2014-06-21 ENCOUNTER — Encounter: Payer: Self-pay | Admitting: General Surgery

## 2014-06-21 NOTE — Patient Instructions (Signed)
Replace dressing as needed daily. May shower normally

## 2014-07-10 ENCOUNTER — Ambulatory Visit: Payer: Medicare Other | Admitting: General Surgery

## 2014-07-12 ENCOUNTER — Ambulatory Visit: Payer: Medicare Other | Admitting: General Surgery

## 2014-07-14 ENCOUNTER — Other Ambulatory Visit: Payer: Self-pay | Admitting: Family

## 2014-07-14 DIAGNOSIS — G40209 Localization-related (focal) (partial) symptomatic epilepsy and epileptic syndromes with complex partial seizures, not intractable, without status epilepticus: Secondary | ICD-10-CM

## 2014-07-14 MED ORDER — ACETAZOLAMIDE 250 MG PO TABS: ORAL_TABLET | ORAL | Status: DC

## 2014-07-14 MED ORDER — CARBATROL 200 MG PO CP12: ORAL_CAPSULE | ORAL | Status: DC

## 2014-07-18 ENCOUNTER — Ambulatory Visit (INDEPENDENT_AMBULATORY_CARE_PROVIDER_SITE_OTHER): Payer: Medicare Other | Admitting: General Surgery

## 2014-07-18 ENCOUNTER — Encounter: Payer: Self-pay | Admitting: General Surgery

## 2014-07-18 DIAGNOSIS — L723 Sebaceous cyst: Secondary | ICD-10-CM

## 2014-07-18 NOTE — Progress Notes (Signed)
Here today for a 3 week follow up of a left axillary abscess. Mom has not noticed any drainage over th past 4 days.  1.5 cm cyst left axilla. No drainage noted. The infection appears to be resolving.  Schedule excision left axilla cyst in 3 weeks.

## 2014-07-18 NOTE — Patient Instructions (Signed)
The patient is aware to call back for any questions or concerns.  

## 2014-07-19 ENCOUNTER — Encounter: Payer: Self-pay | Admitting: General Surgery

## 2014-08-09 ENCOUNTER — Ambulatory Visit: Payer: Medicare Other | Admitting: General Surgery

## 2014-08-14 ENCOUNTER — Other Ambulatory Visit: Payer: Self-pay

## 2014-08-14 MED ORDER — CARBATROL 300 MG PO CP12: ORAL_CAPSULE | ORAL | Status: DC

## 2014-08-14 NOTE — Telephone Encounter (Signed)
This is BMN and needs to be printed/faxed to pharmacy: [phone].

## 2014-08-29 ENCOUNTER — Ambulatory Visit: Payer: Medicare Other | Admitting: General Surgery

## 2014-09-13 ENCOUNTER — Other Ambulatory Visit: Payer: Self-pay

## 2014-09-13 DIAGNOSIS — E876 Hypokalemia: Secondary | ICD-10-CM

## 2014-09-13 MED ORDER — POTASSIUM CHLORIDE 20 MEQ PO PACK: PACK | ORAL | Status: DC

## 2014-09-14 ENCOUNTER — Ambulatory Visit (INDEPENDENT_AMBULATORY_CARE_PROVIDER_SITE_OTHER): Payer: Medicare Other | Admitting: General Surgery

## 2014-09-14 ENCOUNTER — Encounter: Payer: Self-pay | Admitting: General Surgery

## 2014-09-14 VITALS — BP 126/72 | HR 63 | Resp 14 | Ht 68.0 in | Wt 158.0 lb

## 2014-09-14 DIAGNOSIS — L723 Sebaceous cyst: Secondary | ICD-10-CM

## 2014-09-14 NOTE — Progress Notes (Deleted)
Patient ID: Joanna Robinson, female   DOB: [birth year], 34 y.o.   MRN: [id number]  Chief Complaint  Patient presents with  . Other    Left Axillary node exc    HPI Joanna Robinson is a 34 y.o. female here today for a left axillary node excision. HPI  Past Medical History  Diagnosis Date  . Seizures   . Autism   . Epilepsy   . Hyperlipidemia   . Hemorrhoid     No past surgical history on file.  Family History  Problem Relation Age of Onset  . Alzheimer's disease Maternal Grandmother     Died at 25  . Alzheimer's disease Paternal Grandfather     Died at 22    Social History History  Substance Use Topics  . Smoking status: Never Smoker   . Smokeless tobacco: Never Used  . Alcohol Use: No    Allergies  Allergen Reactions  . Codeine   . Corticosteroids Nausea Only    Current Outpatient Prescriptions  Medication Sig Dispense Refill  . acetaminophen (TYLENOL) 500 MG tablet Take 500 mg by mouth every 6 (six) hours as needed for pain.    Marland Kitchen acetaZOLAMIDE (DIAMOX) 250 MG tablet Take 1 tab by mouth twice daily 60 tablet 5  . Alpha-D-Galactosidase (BEANO) TABS Take by mouth as needed (2-3 Tabs as needed prior to meal that may cause bloating.).    Marland Kitchen benzoyl peroxide 5 % gel Apply 5 % topically daily. Apply to face every morning.    . Ca Carbonate-Mag Hydroxide (MYLANTA ULTRA) 700-300 MG CHEW Chew by mouth 3 (three) times daily. Chew 1 by mouth three times daily as needed for abdominal pain.    . calcium carbonate (TUMS E-X 750) 750 MG chewable tablet Chew 2 tablets by mouth 4 (four) times daily. 2 by mouth four times daily for indigestion.    Marland Kitchen CARBATROL 200 MG 12 hr capsule Give Joanna Robinson 2 capsules in the morning and 1 capsule in the evening 90 capsule 5  . CARBATROL 300 MG 12 hr capsule Take 1 capsule at bedtime along with the Carbatrol  capsule 30 capsule 5  . Dextromethorphan-Menthol (DELSYM COUGH RELIEF MT) Use as directed in the mouth or throat as needed (2 Tsp.  by mouth every 12 hours as needed for cough.).    Marland Kitchen doxycycline (VIBRAMYCIN) 100 MG capsule Take 1 capsule (100 mg total) by mouth 2 (two) times daily. 10 capsule 0  . erythromycin ophthalmic ointment 1 application 2 (two) times daily as needed (Apply to affected area twicw daily PRN).    . fexofenadine (ALLEGRA) 180 MG tablet Take 180 mg by mouth daily. 1 By mouth every night at bedtime for allergies.    . fluvoxaMINE (LUVOX) 50 MG tablet Take 50 mg by mouth 2 (two) times daily.    . hydrochlorothiazide (HYDRODIURIL) 25 MG tablet Take 25 mg by mouth daily. 1 by mouth as needed for swelling.    . hydrocortisone (ANUSOL-HC) 2.5 % rectal cream Place 1 application rectally 2 (two) times daily.    . hydrocortisone (ANUSOL-HC) 25 MG suppository Place 25 mg rectally as needed for hemorrhoids.    Marland Kitchen omega-3 acid ethyl esters (LOVAZA) 1 G capsule Take 1 g by mouth daily.    Marland Kitchen omega-3 fish oil (MAXEPA) 1000 MG CAPS capsule Take 1 capsule by mouth daily. 1 Cap every morning with food.    Bertram Gala Glycol-Propyl Glycol (SYSTANE PRESERVATIVE FREE) 0.4-0.3 % SOLN Apply to eye. mOne drop  in each eye every night before bedtime.    . polyethylene glycol powder (MIRALAX) powder Take 17 g by mouth daily. 1 Cap dissolved in 8 oz. Glass of water q hs.    . potassium chloride (KLOR-CON) 20 MEQ packet Dissolve contents of 1 packet in at least 4oz of water/juice and drink once daily. 30 packet 5  . sodium chloride (OCEAN) 0.65 % nasal spray Place 1 spray into the nose as needed for congestion.    . sodium fluoride (DENTA 5000 PLUS) 1.1 % CREA dental cream Place 1 application onto teeth every evening. Brush teeth daily for 1 minute, rinse and spit.    . TRIAMCINOLONE ACETONIDE, TOP, 0.05 % OINT Apply topically 3 (three) times daily. Apply to rash as needed TID.    Marland Kitchen Witch Hazel (TUCKS) 50 % PADS Apply 50 % topically as needed (As needed for cleansing and hemorrhoid discomfort.).     No current facility-administered  medications for this visit.    Review of Systems Review of Systems  Constitutional: Negative.   Respiratory: Negative.   Cardiovascular: Negative.     Blood pressure 126/72, pulse 63, resp. rate 14, height 5\' 8"  (1.727 m), weight 158 lb (71.668 kg).  Physical Exam Physical Exam  Data Reviewed ***  Assessment    ***    Plan    ***     [id number]  Joanna Robinson 09/14/2014, 9:56 AM

## 2014-09-14 NOTE — Patient Instructions (Addendum)
Call the office if pt develops any recurrence of  swelling in the left axilla

## 2014-09-14 NOTE — Progress Notes (Signed)
Patient ID: Joanna Robinson, female   DOB: Aug 15, 1980, 34 y.o.   MRN: [id number]  Chief Complaint  Patient presents with  . Other    Left Axillary node exc    HPI Joanna Robinson is a 34 y.o. female. Here today for excision of a left axillary cyst  HPI  Past Medical History  Diagnosis Date  . Seizures   . Autism   . Epilepsy   . Hyperlipidemia   . Hemorrhoid     History reviewed. No pertinent past surgical history.  Family History  Problem Relation Age of Onset  . Alzheimer's disease Maternal Grandmother     Died at 21  . Alzheimer's disease Paternal Grandfather     Died at 79    Social History History  Substance Use Topics  . Smoking status: Never Smoker   . Smokeless tobacco: Never Used  . Alcohol Use: No    Allergies  Allergen Reactions  . Codeine   . Corticosteroids Nausea Only    Current Outpatient Prescriptions  Medication Sig Dispense Refill  . acetaminophen (TYLENOL) 500 MG tablet Take 500 mg by mouth every 6 (six) hours as needed for pain.    Marland Kitchen acetaZOLAMIDE (DIAMOX) 250 MG tablet Take 1 tab by mouth twice daily 60 tablet 5  . Alpha-D-Galactosidase (BEANO) TABS Take by mouth as needed (2-3 Tabs as needed prior to meal that may cause bloating.).    Marland Kitchen benzoyl peroxide 5 % gel Apply 5 % topically daily. Apply to face every morning.    . Ca Carbonate-Mag Hydroxide (MYLANTA ULTRA) 700-300 MG CHEW Chew by mouth 3 (three) times daily. Chew 1 by mouth three times daily as needed for abdominal pain.    . calcium carbonate (TUMS E-X 750) 750 MG chewable tablet Chew 2 tablets by mouth 4 (four) times daily. 2 by mouth four times daily for indigestion.    Marland Kitchen CARBATROL 200 MG 12 hr capsule Give Mihira 2 capsules in the morning and 1 capsule in the evening 90 capsule 5  . CARBATROL 300 MG 12 hr capsule Take 1 capsule at bedtime along with the Carbatrol 200mg  capsule 30 capsule 5  . Dextromethorphan-Menthol (DELSYM COUGH RELIEF MT) Use as directed in the mouth or  throat as needed (2 Tsp. by mouth every 12 hours as needed for cough.).    Marland Kitchen doxycycline (VIBRAMYCIN) 100 MG capsule Take 1 capsule (100 mg total) by mouth 2 (two) times daily. 10 capsule 0  . erythromycin ophthalmic ointment 1 application 2 (two) times daily as needed (Apply to affected area twicw daily PRN).    . fexofenadine (ALLEGRA) 180 MG tablet Take 180 mg by mouth daily. 1 By mouth every night at bedtime for allergies.    . fluvoxaMINE (LUVOX) 50 MG tablet Take 50 mg by mouth 2 (two) times daily.    . hydrochlorothiazide (HYDRODIURIL) 25 MG tablet Take 25 mg by mouth daily. 1 by mouth as needed for swelling.    . hydrocortisone (ANUSOL-HC) 2.5 % rectal cream Place 1 application rectally 2 (two) times daily.    . hydrocortisone (ANUSOL-HC) 25 MG suppository Place 25 mg rectally as needed for hemorrhoids.    Marland Kitchen omega-3 acid ethyl esters (LOVAZA) 1 G capsule Take 1 g by mouth daily.    Marland Kitchen omega-3 fish oil (MAXEPA) 1000 MG CAPS capsule Take 1 capsule by mouth daily. 1 Cap every morning with food.    Bertram Gala Glycol-Propyl Glycol (SYSTANE PRESERVATIVE FREE) 0.4-0.3 % SOLN Apply to  eye. mOne drop in each eye every night before bedtime.    . polyethylene glycol powder (MIRALAX) powder Take 17 g by mouth daily. 1 Cap dissolved in 8 oz. Glass of water q hs.    . potassium chloride (KLOR-CON) 20 MEQ packet Dissolve contents of 1 packet in at least 4oz of water/juice and drink once daily. 30 packet 5  . sodium chloride (OCEAN) 0.65 % nasal spray Place 1 spray into the nose as needed for congestion.    . sodium fluoride (DENTA 5000 PLUS) 1.1 % CREA dental cream Place 1 application onto teeth every evening. Brush teeth daily for 1 minute, rinse and spit.    . TRIAMCINOLONE ACETONIDE, TOP, 0.05 % OINT Apply topically 3 (three) times daily. Apply to rash as needed TID.    Marland Kitchen Witch Hazel (TUCKS) 50 % PADS Apply 50 % topically as needed (As needed for cleansing and hemorrhoid discomfort.).     No current  facility-administered medications for this visit.    Review of Systems Review of Systems  Blood pressure 126/72, pulse 63, resp. rate 14, height  (1.727 m), weight 158 lb (71.668 kg).  Physical Exam Physical Exam  Exam today showed no residual cyst at site of the large infected cyst in left axilla that was drained few weeks ago. This did not warrant excision at [facility] time. Pt's mother advised. To call if she notes any visble or palpable lump in the left axilla Data Reviewed Prior note  Assessment    Fully resolved left axillary cyst     Plan    Return prn        Kieffer Blatz G 09/14/2014, 10:11 AM

## 2014-09-18 ENCOUNTER — Other Ambulatory Visit: Payer: Self-pay | Admitting: Family Medicine

## 2014-09-18 ENCOUNTER — Telehealth: Payer: Self-pay | Admitting: Family Medicine

## 2014-09-18 DIAGNOSIS — K5900 Constipation, unspecified: Secondary | ICD-10-CM | POA: Insufficient documentation

## 2014-09-18 DIAGNOSIS — D709 Neutropenia, unspecified: Secondary | ICD-10-CM | POA: Insufficient documentation

## 2014-09-18 DIAGNOSIS — G40209 Localization-related (focal) (partial) symptomatic epilepsy and epileptic syndromes with complex partial seizures, not intractable, without status epilepticus: Secondary | ICD-10-CM

## 2014-09-18 MED ORDER — POLYETHYLENE GLYCOL 3350 17 GM/SCOOP PO POWD: 17.0000 g | Freq: Every day | ORAL | Status: DC

## 2014-09-18 NOTE — Telephone Encounter (Signed)
Pt mom called to request a lab slip to recheck tegratol level and white blood count.  KD#[phone]

## 2014-09-18 NOTE — Addendum Note (Signed)
Addended by: Leo GrosserMALONEY, Sruti Ayllon J on: 09/18/2014 11:11 AM   Modules accepted: Orders

## 2014-09-18 NOTE — Telephone Encounter (Signed)
Pt contacted office for refill request on the following medications:  polyethylene glycol powder (MIRALAX) powder.  [facility].  EP#[phone]

## 2014-09-18 NOTE — Telephone Encounter (Signed)
Printed. Please notify mom. Thanks.  

## 2014-10-10 ENCOUNTER — Telehealth: Payer: Self-pay

## 2014-10-10 NOTE — Telephone Encounter (Signed)
Tried calling; no answer 10/10/2014  Thanks,   -Vernona RiegerLaura

## 2014-10-10 NOTE — Telephone Encounter (Signed)
Mom is requesting an order for a wheelchair for pt. Joanna Robinson states she has explained to you the reasons why this wheelchair is needed. Allene DillonEmily Drozdowski, CMA

## 2014-10-10 NOTE — Telephone Encounter (Signed)
Can write rx but will not be covered by insurance.  Thanks.

## 2014-10-13 DIAGNOSIS — D709 Neutropenia, unspecified: Secondary | ICD-10-CM | POA: Diagnosis not present

## 2014-10-13 DIAGNOSIS — G40209 Localization-related (focal) (partial) symptomatic epilepsy and epileptic syndromes with complex partial seizures, not intractable, without status epilepticus: Secondary | ICD-10-CM | POA: Diagnosis not present

## 2014-10-14 LAB — CBC WITH DIFFERENTIAL/PLATELET
BASOS ABS: 0 10*3/uL (ref 0.0–0.2)
BASOS: 0 %
EOS (ABSOLUTE): 0.1 10*3/uL (ref 0.0–0.4)
EOS: 3 %
HEMOGLOBIN: 13.3 g/dL (ref 11.1–15.9)
Hematocrit: 40.9 % (ref 34.0–46.6)
IMMATURE GRANS (ABS): 0 10*3/uL (ref 0.0–0.1)
Immature Granulocytes: 0 %
LYMPHS: 34 %
Lymphocytes Absolute: 1 10*3/uL (ref 0.7–3.1)
MCH: 29.6 pg (ref 26.6–33.0)
MCHC: 32.5 g/dL (ref 31.5–35.7)
MCV: 91 fL (ref 79–97)
MONOCYTES: 16 %
Monocytes Absolute: 0.5 10*3/uL (ref 0.1–0.9)
Neutrophils Absolute: 1.3 10*3/uL — ABNORMAL LOW (ref 1.4–7.0)
Neutrophils: 47 %
Platelets: 153 10*3/uL (ref 150–379)
RBC: 4.5 x10E6/uL (ref 3.77–5.28)
RDW: 13.4 % (ref 12.3–15.4)
WBC: 2.8 10*3/uL — ABNORMAL LOW (ref 3.4–10.8)

## 2014-10-14 LAB — CARBAMAZEPINE LEVEL, TOTAL: CARBAMAZEPINE LVL: 11.7 ug/mL (ref 4.0–12.0)

## 2014-10-17 ENCOUNTER — Telehealth: Payer: Self-pay

## 2014-10-17 NOTE — Telephone Encounter (Signed)
-----   Message from Lorie Phenix, MD sent at 10/15/2014  9:46 PM EDT ----- Carbamazepine level normal.  CBC the same with low WBC.   Not changed.  Please notify mother, [name]. Thanks.

## 2014-10-17 NOTE — Telephone Encounter (Signed)
[name] advised.   Thanks,   -Cesia Orf  

## 2014-10-30 ENCOUNTER — Other Ambulatory Visit: Payer: Self-pay

## 2014-10-30 DIAGNOSIS — L709 Acne, unspecified: Secondary | ICD-10-CM

## 2014-11-06 ENCOUNTER — Telehealth: Payer: Self-pay

## 2014-11-06 DIAGNOSIS — G40209 Localization-related (focal) (partial) symptomatic epilepsy and epileptic syndromes with complex partial seizures, not intractable, without status epilepticus: Secondary | ICD-10-CM

## 2014-11-06 NOTE — Telephone Encounter (Signed)
Received a call from [facility], they were requiring the Dx for the wheelchair order.

## 2014-11-16 ENCOUNTER — Telehealth: Payer: Self-pay

## 2014-11-16 NOTE — Telephone Encounter (Signed)
Called mom to inform her that order for power chair for pt has been signed and faxed back to [facility]. Allene Dillon, CMA

## 2014-11-17 DIAGNOSIS — F840 Autistic disorder: Secondary | ICD-10-CM | POA: Diagnosis not present

## 2014-11-20 ENCOUNTER — Telehealth: Payer: Self-pay | Admitting: Family Medicine

## 2014-11-20 NOTE — Telephone Encounter (Signed)
Wrote rx again. Please refax. Thanks.

## 2014-11-20 NOTE — Telephone Encounter (Signed)
Joanna Robinson with [employer] needs a writen RX/Order for a 18 X 16 transport chair faxed to [phone]. Joanna Robinson stated that what our office had already sent Medicaid will not accept. Thanks TNP

## 2014-11-22 NOTE — Telephone Encounter (Signed)
Faxed prescription to [phone], as directed below.   Thanks,

## 2014-12-16 ENCOUNTER — Other Ambulatory Visit: Payer: Self-pay | Admitting: Family Medicine

## 2014-12-16 DIAGNOSIS — R519 Headache, unspecified: Secondary | ICD-10-CM

## 2014-12-16 DIAGNOSIS — R51 Headache: Principal | ICD-10-CM

## 2014-12-18 DIAGNOSIS — R519 Headache, unspecified: Secondary | ICD-10-CM | POA: Insufficient documentation

## 2014-12-18 DIAGNOSIS — R51 Headache: Secondary | ICD-10-CM

## 2014-12-20 ENCOUNTER — Ambulatory Visit (INDEPENDENT_AMBULATORY_CARE_PROVIDER_SITE_OTHER): Payer: Medicare Other | Admitting: Family Medicine

## 2014-12-20 ENCOUNTER — Encounter: Payer: Self-pay | Admitting: Family Medicine

## 2014-12-20 VITALS — BP 116/70 | HR 80 | Temp 98.8°F | Resp 16

## 2014-12-20 DIAGNOSIS — E785 Hyperlipidemia, unspecified: Secondary | ICD-10-CM | POA: Insufficient documentation

## 2014-12-20 DIAGNOSIS — R569 Unspecified convulsions: Secondary | ICD-10-CM | POA: Insufficient documentation

## 2014-12-20 DIAGNOSIS — N926 Irregular menstruation, unspecified: Secondary | ICD-10-CM | POA: Insufficient documentation

## 2014-12-20 DIAGNOSIS — G40909 Epilepsy, unspecified, not intractable, without status epilepticus: Secondary | ICD-10-CM | POA: Diagnosis not present

## 2014-12-20 DIAGNOSIS — F840 Autistic disorder: Secondary | ICD-10-CM | POA: Diagnosis not present

## 2014-12-20 DIAGNOSIS — D72819 Decreased white blood cell count, unspecified: Secondary | ICD-10-CM | POA: Insufficient documentation

## 2014-12-20 DIAGNOSIS — J302 Other seasonal allergic rhinitis: Secondary | ICD-10-CM

## 2014-12-20 DIAGNOSIS — T148XXA Other injury of unspecified body region, initial encounter: Secondary | ICD-10-CM | POA: Insufficient documentation

## 2014-12-20 DIAGNOSIS — E876 Hypokalemia: Secondary | ICD-10-CM | POA: Insufficient documentation

## 2014-12-20 DIAGNOSIS — H04129 Dry eye syndrome of unspecified lacrimal gland: Secondary | ICD-10-CM | POA: Insufficient documentation

## 2014-12-20 DIAGNOSIS — L720 Epidermal cyst: Secondary | ICD-10-CM | POA: Insufficient documentation

## 2014-12-20 DIAGNOSIS — R799 Abnormal finding of blood chemistry, unspecified: Secondary | ICD-10-CM | POA: Insufficient documentation

## 2014-12-20 DIAGNOSIS — R5383 Other fatigue: Secondary | ICD-10-CM

## 2014-12-20 DIAGNOSIS — R5381 Other malaise: Secondary | ICD-10-CM | POA: Insufficient documentation

## 2014-12-20 DIAGNOSIS — L02419 Cutaneous abscess of limb, unspecified: Secondary | ICD-10-CM | POA: Insufficient documentation

## 2014-12-20 MED ORDER — MONTELUKAST SODIUM 10 MG PO TABS: 10.0000 mg | ORAL_TABLET | Freq: Every day | ORAL | Status: DC

## 2014-12-20 NOTE — Progress Notes (Signed)
Subjective:    Patient ID: Joanna Robinson, female    DOB: Jun 14, 1980, 34 y.o.   MRN: [id number]  URI  This is a new problem. The current episode started in the past 7 days. There has been no fever. Associated symptoms include congestion, coughing, rhinorrhea and sneezing. Pertinent negatives include no vomiting. She has tried acetaminophen for the symptoms. The treatment provided no relief.  Asked to come and see me which is unusual. Doing well living at home with her mother instead of group home. Getting ready for her Halloween dance.     Review of Systems  HENT: Positive for congestion, rhinorrhea and sneezing.        Eye Discharge present, Eye Redness present  Respiratory: Positive for cough.   Gastrointestinal: Negative for vomiting.  Musculoskeletal: Positive for arthralgias ("body hurts").  Allergic/Immunologic: Positive for environmental allergies.   BP 116/70 mmHg  Pulse 80  Temp(Src) 98.8 F (37.1 C) (Oral)  Resp 16  LMP 12/13/2014 (Approximate)   Patient Active Problem List   Diagnosis Date Noted  . Abnormal blood chemistry 12/20/2014  . Abscess of axilla 12/20/2014  . Dry eye syndrome 12/20/2014  . Dermoid inclusion cyst 12/20/2014  . Hematoma 12/20/2014  . HLD (hyperlipidemia) 12/20/2014  . Decreased potassium in the blood 12/20/2014  . Irregular bleeding 12/20/2014  . Decreased leukocytes 12/20/2014  . Malaise and fatigue 12/20/2014  . Seizure 12/20/2014  . Headache 12/18/2014  . Acne 10/30/2014  . Constipation 09/18/2014  . Neutropenia 09/18/2014  . Hypokalemia 09/13/2014  . Elevated carbamazepine level 01/25/2014  . Partial epilepsy with impairment of consciousness 01/10/2013  . Active autistic disorder 01/10/2013  . Long-term use of high-risk medication 01/10/2013  . Dysmenorrhea 07/10/2008  . Mechanical and motor problems with internal organs 11/08/2007  . Colonic constipation 11/03/2007  . Allergic rhinitis 08/20/2007  . External hemorrhoids  without complication 11/13/2006  . Accumulation of fluid in tissues 12/26/2003  . Epilepsy, not refractory 12/26/2003  . Intellectual disability 12/26/2003   Past Medical History  Diagnosis Date  . Seizures   . Autism   . Epilepsy   . Hyperlipidemia   . Hemorrhoid    Current Outpatient Prescriptions on File Prior to Visit  Medication Sig  . acetaZOLAMIDE (DIAMOX) 250 MG tablet Take 1 tab by mouth twice daily  . Alpha-D-Galactosidase (BEANO) TABS Take by mouth as needed (2-3 Tabs as needed prior to meal that may cause bloating.).  Marland Kitchen benzoyl peroxide 5 % gel Apply 5 % topically daily. Apply to face every morning.  . Ca Carbonate-Mag Hydroxide (MYLANTA ULTRA) 700-300 MG CHEW Chew by mouth 3 (three) times daily. Chew 1 by mouth three times daily as needed for abdominal pain.  . calcium carbonate (TUMS E-X 750) 750 MG chewable tablet Chew 2 tablets by mouth 4 (four) times daily. 2 by mouth four times daily for indigestion.  Marland Kitchen CARBATROL 200 MG 12 hr capsule Give Suzie 2 capsules in the morning and 1 capsule in the evening  . CARBATROL 300 MG 12 hr capsule Take 1 capsule at bedtime along with the Carbatrol  capsule  . Dextromethorphan-Menthol (DELSYM COUGH RELIEF MT) Use as directed in the mouth or throat as needed (2 Tsp. by mouth every 12 hours as needed for cough.).  Marland Kitchen doxycycline (VIBRAMYCIN) 100 MG capsule Take 1 capsule (100 mg total) by mouth 2 (two) times daily.  Marland Kitchen erythromycin ophthalmic ointment 1 application 2 (two) times daily as needed (Apply to affected area twicw daily PRN).  Marland Kitchen  fexofenadine (ALLEGRA) 180 MG tablet Take 180 mg by mouth daily. 1 By mouth every night at bedtime for allergies.  . fluvoxaMINE (LUVOX) 50 MG tablet Take 50 mg by mouth 2 (two) times daily.  Marland Kitchen GNP PAIN RELIEF EX-STRENGTH 500 MG tablet TAKE 2 TABLETS BY MOUTH 3 TIMES DAILY ASNEEDED FOR HEADACHE  . hydrochlorothiazide (HYDRODIURIL) 25 MG tablet Take 25 mg by mouth daily. 1 by mouth as needed for  swelling.  . hydrocortisone (ANUSOL-HC) 2.5 % rectal cream Place 1 application rectally 2 (two) times daily.  . hydrocortisone (ANUSOL-HC) 25 MG suppository Place 25 mg rectally as needed for hemorrhoids.  Marland Kitchen omega-3 acid ethyl esters (LOVAZA) 1 G capsule Take 1 g by mouth daily.  Marland Kitchen omega-3 fish oil (MAXEPA) 1000 MG CAPS capsule Take 1 capsule by mouth daily. 1 Cap every morning with food.  Bertram Gala Glycol-Propyl Glycol (SYSTANE PRESERVATIVE FREE) 0.4-0.3 % SOLN Apply to eye. mOne drop in each eye every night before bedtime.  . polyethylene glycol powder (MIRALAX) powder Take 17 g by mouth daily. 1 Cap dissolved in 8 oz. Glass of water q hs.  . potassium chloride (KLOR-CON) 20 MEQ packet Dissolve contents of 1 packet in at least 4oz of water/juice and drink once daily.  . sodium chloride (OCEAN) 0.65 % nasal spray Place 1 spray into the nose as needed for congestion.  . sodium fluoride (DENTA 5000 PLUS) 1.1 % CREA dental cream Place 1 application onto teeth every evening. Brush teeth daily for 1 minute, rinse and spit.  . TRIAMCINOLONE ACETONIDE, TOP, 0.05 % OINT Apply topically 3 (three) times daily. Apply to rash as needed TID.  Marland Kitchen Witch Hazel (TUCKS) 50 % PADS Apply 50 % topically as needed (As needed for cleansing and hemorrhoid discomfort.).   No current facility-administered medications on file prior to visit.   Allergies  Allergen Reactions  . Codeine   . Corticosteroids Nausea Only   No past surgical history on file. Social History   Social History  . Marital Status: Single    Spouse Name: N/A  . Number of Children: N/A  . Years of Education: N/A   Occupational History  . Not on file.   Social History Main Topics  . Smoking status: Never Smoker   . Smokeless tobacco: Never Used  . Alcohol Use: No  . Drug Use: No  . Sexual Activity: No   Other Topics Concern  . Not on file   Social History Narrative   Family History  Problem Relation Age of Onset  . Alzheimer's  disease Maternal Grandmother     Died at 24  . Alzheimer's disease Paternal Grandfather     Died at 75      Objective:   Physical Exam  Constitutional: She appears well-developed and well-nourished.  HENT:  Head: Normocephalic and atraumatic.  Right Ear: External ear normal.  Left Ear: External ear normal.  Nose: Mucosal edema and rhinorrhea present.  Mouth/Throat: Oropharynx is clear and moist. No oropharyngeal exudate.  Eyes: Conjunctivae and EOM are normal. Pupils are equal, round, and reactive to light.  Neck: Normal range of motion. Neck supple.  Cardiovascular: Normal rate and regular rhythm.   Pulmonary/Chest: Effort normal and breath sounds normal.  Neurological: She is alert. Coordination abnormal.  Usual spontaneous movements at time. Slow to speak. "How's Dr. Elease Hashimoto" , hug at end of visit as always.     BP 116/70 mmHg  Pulse 80  Temp(Src) 98.8 F (37.1 C) (Oral)  Resp  16  LMP 12/13/2014 (Approximate)       Assessment & Plan:  1. Other seasonal allergic rhinitis Unclear is allergies, cold or early sinus infection. Appears well today. Here with her mom who is very reliable. Will call if symptoms worsen or do not improve. Difficult to assess secondary to patient's underlying medical problems.   - montelukast (SINGULAIR) 10 MG tablet; Take 1 tablet (10 mg total) by mouth daily.  Dispense: 30 tablet; Refill: 5  2. Epilepsy, not refractory Stable.   3. Active autistic disorder Stable, but does complicate medical assessment.    Lorie Phenix, MD

## 2014-12-21 ENCOUNTER — Telehealth: Payer: Self-pay

## 2014-12-21 DIAGNOSIS — J019 Acute sinusitis, unspecified: Secondary | ICD-10-CM

## 2014-12-21 MED ORDER — AMOXICILLIN-POT CLAVULANATE 875-125 MG PO TABS
1.0000 | ORAL_TABLET | Freq: Two times a day (BID) | ORAL | Status: DC
Start: 2014-12-21 — End: 2015-04-11

## 2014-12-21 MED ORDER — SULFACETAMIDE SODIUM 10 % OP SOLN: 2.0000 [drp] | OPHTHALMIC | Status: DC

## 2014-12-21 NOTE — Telephone Encounter (Signed)
Mother called stating that the Singulair that was prescribed yesterday is a medication that pt already takes daily. Mom is requesting an antibiotic and eye drops be sent to [facility]. Reports pt is doing worse. Please do not prescribe prednisone or codeine, as these interact with seizure medication. CB# [id number]. Allene Dillon, CMA

## 2014-12-21 NOTE — Telephone Encounter (Signed)
LMTCB 12/21/2014  Thanks,   -Laura  

## 2014-12-21 NOTE — Telephone Encounter (Signed)
Advised pt's mother as directed below. Pt's mother verbalized fully understanding.  Thanks,

## 2014-12-21 NOTE — Telephone Encounter (Signed)
Sent in rx.  Thanks.

## 2014-12-22 ENCOUNTER — Telehealth: Payer: Self-pay | Admitting: Family

## 2014-12-22 NOTE — Telephone Encounter (Signed)
Letter mailed as requested. TG

## 2014-12-22 NOTE — Telephone Encounter (Signed)
[name] left message saying that she needed a letter to show need for a caregiver for Joanna Robinson while Mom is at work. I called Mom and let her know that I will mail her a letter. TG

## 2014-12-29 ENCOUNTER — Telehealth: Payer: Self-pay

## 2014-12-29 NOTE — Telephone Encounter (Signed)
Patient's mother called and states that she is trying to get agency to help get assitance to help mother with patient's need and they need a letter from [place] explaining patient's condition and things the mother has to do for patient and why. Thank you-aa

## 2014-12-29 NOTE — Telephone Encounter (Signed)
Be glad to write letter. Really would work better if Joanna Robinson could draft the letter with what she wants it to say and then I will tweak it and put it on letter head. Thanks.

## 2015-01-03 NOTE — Telephone Encounter (Signed)
LMTCB 01/03/2015  Thanks,   -Ovidio Steele  

## 2015-01-04 ENCOUNTER — Other Ambulatory Visit: Payer: Self-pay | Admitting: Family Medicine

## 2015-01-04 ENCOUNTER — Other Ambulatory Visit: Payer: Self-pay

## 2015-01-04 DIAGNOSIS — G40209 Localization-related (focal) (partial) symptomatic epilepsy and epileptic syndromes with complex partial seizures, not intractable, without status epilepticus: Secondary | ICD-10-CM

## 2015-01-04 DIAGNOSIS — R609 Edema, unspecified: Secondary | ICD-10-CM

## 2015-01-04 MED ORDER — ACETAZOLAMIDE 250 MG PO TABS: ORAL_TABLET | ORAL | Status: DC

## 2015-01-05 NOTE — Telephone Encounter (Signed)
[name] advised as directed below.   Thanks,   -Vernona RiegerLaura

## 2015-01-07 ENCOUNTER — Other Ambulatory Visit: Payer: Self-pay | Admitting: Family Medicine

## 2015-01-07 DIAGNOSIS — J302 Other seasonal allergic rhinitis: Secondary | ICD-10-CM

## 2015-01-11 ENCOUNTER — Encounter: Payer: Self-pay | Admitting: Family

## 2015-01-11 ENCOUNTER — Ambulatory Visit (INDEPENDENT_AMBULATORY_CARE_PROVIDER_SITE_OTHER): Payer: Medicare Other | Admitting: Family

## 2015-01-11 VITALS — BP 120/80 | HR 84 | Ht 68.0 in | Wt 159.0 lb

## 2015-01-11 DIAGNOSIS — F79 Unspecified intellectual disabilities: Secondary | ICD-10-CM | POA: Diagnosis not present

## 2015-01-11 DIAGNOSIS — F840 Autistic disorder: Secondary | ICD-10-CM | POA: Diagnosis not present

## 2015-01-11 DIAGNOSIS — G40209 Localization-related (focal) (partial) symptomatic epilepsy and epileptic syndromes with complex partial seizures, not intractable, without status epilepticus: Secondary | ICD-10-CM | POA: Diagnosis not present

## 2015-01-11 NOTE — Progress Notes (Signed)
Patient: Joanna Robinson MRN: [id number] Sex: female DOB: 1980-07-27  Provider: Elveria Risingina Lilyan Prete, NP [facility]  Note type: Routine return visit  History of Present Illness: Referral Source: Dr Lorie PhenixNancy Maloney [facility] and [facility]'s [facility] Chief Complaint: Autism and seizures  Joanna Robinson is a 34 y.o. woman with history of undifferentiated autism, and history of complex partial seizures. She was last seen January 10, 2014. Joanna Robinson used to live in a group home, but has lived at home with her mother for over a year. Mom has a caregiver that comes in to help with Joanna Robinson, and she says that things are going well with her at home. Joanna Robinson's behavior is entirely self-directed and she cannot be hurried. Mom feels that Joanna Robinson is happier at home because her slow behavior is tolerated better than the group home. Mom tells me that recently when she took her out shopping, that Joanna Robinson stopped in the middle of a shopping center street and would not move for about 30 minutes. The police were called and they and another shopper helped Mom deal with getting her out of the street. Since then Mom got a lightweight wheelchair so that she can take Joanna Robinson out and feel that she is more safe then when she is walking. Mom said that this has worked well and that Joanna Robinson does not resist the wheelchair when they go out.   Mom reports that Joanna Robinson has been otherwise generally physically healthy. She has about 2 or 3 brief seizures per year, usually associated with her menstrual period. Her last seizure occurred about 3 months ago and Mom says that it was brief.   Joanna Robinson's mother has no other health concerns for her today other than previously mentioned.  Review of Systems: Please see the HPI for neurologic and other pertinent review of systems. Otherwise, the following systems are noncontributory including constitutional, eyes, ears, nose and throat,  cardiovascular, respiratory, gastrointestinal, genitourinary, musculoskeletal, skin, endocrine, hematologic/lymph, allergic/immunologic and psychiatric.   Past Medical History  Diagnosis Date  . Seizures (HCC)   . Autism   . Epilepsy (HCC)   . Hyperlipidemia   . Hemorrhoid    Hospitalizations: No., Head Injury: No., Nervous System Infections: No., Immunizations up to date: Yes.   Past Medical History Comments: See history  Surgical History No past surgical history on file.  Family History family history includes Alzheimer's disease in her maternal grandmother and paternal grandfather. Family History is otherwise negative for migraines, seizures, cognitive impairment, blindness, deafness, birth defects, chromosomal disorder, autism.  Allergies Allergies  Allergen Reactions  . Codeine   . Corticosteroids Nausea Only    Physical Exam BP 120/80 mmHg  Pulse 84  Ht 5\' 8"  (1.727 m)  Wt 159 lb (72.122 kg)  BMI 24.18 kg/m2  LMP 12/13/2014 (Approximate) General: alert, well developed, well nourished female, in no acute distress. She intermittently tossed her head, flapped her arms and hands throughout the visit.  Head: normocephalic, no dysmorphic features,  Ears, Nose and Throat: Pharynx: oropharynx is pink without exudates or tonsillar hypertrophy.  Neck: supple, full range of motion, no cranial or cervical bruits  Respiratory: auscultation clear  Cardiovascular: no murmurs, pulses are normal  Musculoskeletal: no skeletal deformities or apparent scoliosis  Skin: no rashes or neurocutaneous lesions   Neurologic Exam  Mental Status: alert; oriented to person; knowledge is below normal for age; language is pragmatic and somewhat echolalic. She will repeat a phrase over over again until she  has acknowledged. She was fairly cooperative for examination.  Cranial Nerves: visual fields appear full but examination was limited by patient's inability to fully cooperate; extraocular  movements are full and conjugate; pupils are round reactive to light; funduscopic examination shows sharp disc margins with normal vessels; symmetric facial strength; midline tongue and uvula; hearing is functionally normal  Motor: Normal strength, tone, and mass; good fine motor movements  Sensory: withdrawal x 4  Coordination: had difficulty following instructions but coordination seems adequate.  Gait and Station: normal gait and station. She is slightly stiff in her posture, but no evidence of hemiparesis or circumduction. Balance is adequate Reflexes: symmetric and diminished bilaterally; no clonus, toes downgoing   Impression 1. Autism spectrum disorder 2. Complex partial seizures with secondary generalization   Recommendations for plan of care The patient's previous [facility] records were reviewed. Joanna Robinson has neither had nor required imaging or lab studies since the last visit. She is a 34 year old young woman with undifferentiated autism, and history of complex partial seizures. She has occasional brief seizures usually associated with her menstrual period. Joanna Robinson has been otherwise health and is doing well. She will continue on her medication without change and will return for follow up in 1 year or sooner if needed. Mom agreed with this plan.  The medication list was reviewed and reconciled.  No changes were made in the prescribed medications today.  A complete medication list was provided to the patient's mother.  Dr. Sharene Skeans was consulted regarding the patient.   Total time spent with the patient was 25 minutes, of which 50% or more was spent in counseling and coordination of care.

## 2015-01-15 NOTE — Patient Instructions (Signed)
Continue Joanna Robinson's medication as you have been giving it. Let me know if her seizures increase in frequency or severity.   Please plan to return for follow up in 1 year or sooner if needed.

## 2015-02-06 ENCOUNTER — Other Ambulatory Visit: Payer: Self-pay

## 2015-02-06 ENCOUNTER — Other Ambulatory Visit: Payer: Self-pay | Admitting: Family

## 2015-02-06 DIAGNOSIS — G40209 Localization-related (focal) (partial) symptomatic epilepsy and epileptic syndromes with complex partial seizures, not intractable, without status epilepticus: Secondary | ICD-10-CM

## 2015-02-06 MED ORDER — CARBATROL 300 MG PO CP12
ORAL_CAPSULE | ORAL | Status: DC
Start: 2015-02-06 — End: 2015-02-06

## 2015-02-06 MED ORDER — CARBATROL 200 MG PO CP12: ORAL_CAPSULE | ORAL | Status: DC

## 2015-02-06 MED ORDER — CARBATROL 300 MG PO CP12: ORAL_CAPSULE | ORAL | Status: DC

## 2015-02-06 NOTE — Telephone Encounter (Signed)
Previous Rx's did not print. TG 

## 2015-03-07 ENCOUNTER — Other Ambulatory Visit: Payer: Self-pay | Admitting: Family Medicine

## 2015-03-07 DIAGNOSIS — J302 Other seasonal allergic rhinitis: Secondary | ICD-10-CM

## 2015-03-08 ENCOUNTER — Telehealth: Payer: Self-pay | Admitting: Family

## 2015-03-08 NOTE — Telephone Encounter (Signed)
[name] left message about Joanna Robinson. Mom said that she is working to get Joanna Robinson accepted into a new day program. She needs a letter stating that Joanna Robinson needs one to one aide to care for her at the day program, to do things such as push wheelchair, prompting her to move, eat and so forth etc Mom asked for call back at work [phone] or cell [phone]. I wrote the letter and sent it to Joanna Robinson for signature. I called Mom and she asked for the letter to be mailed. I will mail the letter to her as requested. TG

## 2015-03-08 NOTE — Telephone Encounter (Signed)
Reviewed and signed

## 2015-03-12 ENCOUNTER — Other Ambulatory Visit: Payer: Self-pay | Admitting: Family Medicine

## 2015-03-12 DIAGNOSIS — E876 Hypokalemia: Secondary | ICD-10-CM

## 2015-03-12 MED ORDER — POTASSIUM CHLORIDE 20 MEQ PO PACK: PACK | ORAL | Status: DC

## 2015-03-12 NOTE — Telephone Encounter (Signed)
Pt contacted office for refill request on the following medications:   potassium chloride (KLOR-CON) 20 MEQ packet.  [facility].  XL#[phone]/DGCB#[phone]/MW

## 2015-03-13 ENCOUNTER — Other Ambulatory Visit: Payer: Self-pay | Admitting: Family Medicine

## 2015-03-13 DIAGNOSIS — E876 Hypokalemia: Secondary | ICD-10-CM

## 2015-03-13 MED ORDER — POTASSIUM CHLORIDE 20 MEQ PO PACK: PACK | ORAL | Status: DC

## 2015-03-13 NOTE — Telephone Encounter (Signed)
Mom called needeing a new refill on Danielles potassium.  [facility]  Moms call back is [phone]  Thanks Barth Kirkseri

## 2015-03-15 NOTE — Telephone Encounter (Signed)
Mom Derrell LollingYolanda Iribe left a message saying that she had not received the letter that was mailed and asked for a copy to be mailed to her again. I put another copy in the mail to her. TG

## 2015-03-21 ENCOUNTER — Other Ambulatory Visit: Payer: Self-pay | Admitting: Family Medicine

## 2015-03-21 DIAGNOSIS — L709 Acne, unspecified: Secondary | ICD-10-CM

## 2015-03-21 MED ORDER — AVEENO CLEAR COMPLEXION BB EX CREA: TOPICAL_CREAM | CUTANEOUS | Status: DC

## 2015-03-21 NOTE — Telephone Encounter (Signed)
Mother is calling stating pt needs a refill on Sunscreens (AVEENO CLEAR COMPLEXION BB) CREA.  Pt is out of this. Pt uses [facility]. Thanks CC

## 2015-04-07 ENCOUNTER — Encounter: Payer: Self-pay | Admitting: Family Medicine

## 2015-04-07 ENCOUNTER — Ambulatory Visit (INDEPENDENT_AMBULATORY_CARE_PROVIDER_SITE_OTHER): Payer: Medicare Other | Admitting: Family Medicine

## 2015-04-07 VITALS — BP 98/60 | HR 70 | Temp 98.2°F | Resp 16

## 2015-04-07 DIAGNOSIS — Z9189 Other specified personal risk factors, not elsewhere classified: Secondary | ICD-10-CM | POA: Diagnosis not present

## 2015-04-07 DIAGNOSIS — J069 Acute upper respiratory infection, unspecified: Secondary | ICD-10-CM | POA: Diagnosis not present

## 2015-04-07 MED ORDER — SODIUM FLUORIDE 1.1 % DT CREA: 1.0000 "application " | TOPICAL_CREAM | Freq: Every evening | DENTAL | Status: DC

## 2015-04-07 NOTE — Patient Instructions (Addendum)
Discussed use of Mucinex D for congestion, Delsym for cough, and Benadryl for postnasal drainage. May use warm compresses for eye discomfort.

## 2015-04-07 NOTE — Progress Notes (Signed)
Subjective:     Patient ID: Joanna Robinson, female   DOB: 01/08/1981, 35 y.o.   MRN: [id number]  HPI  Chief Complaint  Patient presents with  . Sinusitis    Congestion started yesterday and the sorethroat. Patient got Tylenol yesterday. Today just her regular medicines. No fever, SOB,chest tightness, or wheezing.  Mom states mild sinus congestion onset 1/10.C/o of sore throat and facial discomfort yesterday.   Review of Systems     Objective:   Physical Exam  Constitutional: She appears well-developed and well-nourished. No distress.  Ears: T.M's intact without inflammation Sinuses: non-tender Throat: no tonsillar enlargement or exudate Neck: no cervical adenopathy Lungs: clear     Assessment:    1. Upper respiratory infection  2. Poor dental hygiene: Wishes refill on fluoride cream  - sodium fluoride (DENTA 5000 PLUS) 1.1 % CREA dental cream; Place 1 application onto teeth every evening. Brush teeth daily for 1 minute, rinse and spit.  Dispense: 1 Tube; Refill: 5    Plan:    Discussed use of Mucinex D for congestion, Delsym for cough, and Benadryl for postnasal drainage.

## 2015-04-11 ENCOUNTER — Telehealth: Payer: Self-pay | Admitting: Family Medicine

## 2015-04-11 DIAGNOSIS — J019 Acute sinusitis, unspecified: Secondary | ICD-10-CM

## 2015-04-11 MED ORDER — AMOXICILLIN-POT CLAVULANATE 875-125 MG PO TABS: 1.0000 | ORAL_TABLET | Freq: Two times a day (BID) | ORAL | Status: DC

## 2015-04-11 NOTE — Telephone Encounter (Signed)
Sent in rx for augmentin.   Please notify patient's mom.  Thanks.

## 2015-04-11 NOTE — Telephone Encounter (Signed)
Pt's mother Joanna Robinson called requesting something be called into [facility].She was seen this past Sat for upper respiratory infection.Symptoms are not any better.Call back # [phone]

## 2015-04-12 NOTE — Telephone Encounter (Signed)
Left message advising [name].   Thanks,   -Vernona Rieger

## 2015-04-28 ENCOUNTER — Other Ambulatory Visit: Payer: Self-pay | Admitting: Family Medicine

## 2015-04-28 DIAGNOSIS — K5900 Constipation, unspecified: Secondary | ICD-10-CM

## 2015-06-06 ENCOUNTER — Other Ambulatory Visit: Payer: Self-pay | Admitting: Family Medicine

## 2015-06-06 DIAGNOSIS — J302 Other seasonal allergic rhinitis: Secondary | ICD-10-CM

## 2015-06-12 ENCOUNTER — Other Ambulatory Visit: Payer: Self-pay | Admitting: Family Medicine

## 2015-06-28 ENCOUNTER — Telehealth: Payer: Self-pay | Admitting: Family Medicine

## 2015-07-09 ENCOUNTER — Other Ambulatory Visit: Payer: Self-pay | Admitting: Family

## 2015-07-09 DIAGNOSIS — G40209 Localization-related (focal) (partial) symptomatic epilepsy and epileptic syndromes with complex partial seizures, not intractable, without status epilepticus: Secondary | ICD-10-CM

## 2015-07-09 MED ORDER — CARBATROL 200 MG PO CP12: ORAL_CAPSULE | ORAL | Status: DC

## 2015-07-09 MED ORDER — ACETAZOLAMIDE 250 MG PO TABS: ORAL_TABLET | ORAL | Status: DC

## 2015-07-09 MED ORDER — CARBATROL 300 MG PO CP12: ORAL_CAPSULE | ORAL | Status: DC

## 2015-07-23 ENCOUNTER — Encounter: Payer: Self-pay | Admitting: Family Medicine

## 2015-07-23 ENCOUNTER — Ambulatory Visit (INDEPENDENT_AMBULATORY_CARE_PROVIDER_SITE_OTHER): Payer: Medicare Other | Admitting: Family Medicine

## 2015-07-23 VITALS — BP 98/64 | HR 76 | Temp 99.0°F | Resp 16 | Wt 169.0 lb

## 2015-07-23 DIAGNOSIS — J302 Other seasonal allergic rhinitis: Secondary | ICD-10-CM

## 2015-07-23 DIAGNOSIS — D709 Neutropenia, unspecified: Secondary | ICD-10-CM

## 2015-07-23 DIAGNOSIS — G40909 Epilepsy, unspecified, not intractable, without status epilepticus: Secondary | ICD-10-CM

## 2015-07-23 DIAGNOSIS — E785 Hyperlipidemia, unspecified: Secondary | ICD-10-CM | POA: Diagnosis not present

## 2015-07-23 MED ORDER — FLUTICASONE PROPIONATE 50 MCG/ACT NA SUSP: 2.0000 | Freq: Every day | NASAL | Status: DC

## 2015-07-23 NOTE — Progress Notes (Signed)
Patient ID: Joanna Robinson, female   DOB: 1980/09/04, 35 y.o.   MRN: [id number]         Patient: Joanna Robinson Female    DOB: Apr 01, 1980   35 y.o.   MRN: [id number] Visit Date: 07/23/2015  Today's Provider: Lorie Phenix, MD   Chief Complaint  Patient presents with  . Allergic Rhinitis    Subjective:    HPI   Allergic Rhinitis: Joanna Robinson is here for evaluation of possible allergic rhinitis. Patient's symptoms include clear rhinorrhea, nasal congestion and watery eyes. These symptoms are seasonal. Current triggers include exposure to no known precipitant. The patient has been suffering from these symptoms for approximately Several  weeks. The patient has tried prescription antihistamines with fair relief of symptoms. Pt's Mom report Joanna Robinson is complaining of nasal congestion.  Immunotherapy has never been tried. The patient has never had nasal polyps. The patient has no history of asthma.  The patient has not had sinus surgery in the past. The patient has a history of eczema.  Seizure disorder stable. Tolerating medication without difficulty. Does follow up with cardiology.      Allergies  Allergen Reactions  . Codeine   . Corticosteroids Nausea Only   Previous Medications   ACETAZOLAMIDE (DIAMOX) 250 MG TABLET    Take 1 tab by mouth twice daily   ALLEGRA ALLERGY 180 MG TABLET    TAKE 1 TABLET BY MOUTH ONCE DAILY.   ALPHA-D-GALACTOSIDASE (BEANO) TABS    Take by mouth as needed (2-3 Tabs as needed prior to meal that may cause bloating.).   ALUMINUM & MAGNESIUM HYDROXIDE (MYLANTA ULTIMATE STRENGTH) 500-500 MG/5ML SUSP    MYLANTA ULTIMATE STRENGTH, 500-500MG /5ML (Oral Suspension)  1 TID as needed for 0 days  Quantity: 0.00;  Refills: 0   Ordered :14-Mar-2010  Denna Haggard, MA, Anastasiya ;  Started 19-October-2008 Active Comments: Medication taken as needed.    BENZOYL PEROXIDE 5 % GEL    Apply 5 % topically daily. Apply to face every morning.   CA CARBONATE-MAG  HYDROXIDE (MYLANTA ULTRA) 700-300 MG CHEW    Chew by mouth 3 (three) times daily. Chew 1 by mouth three times daily as needed for abdominal pain.   CALCIUM CARBONATE (TUMS E-X 750) 750 MG CHEWABLE TABLET    Chew 2 tablets by mouth 4 (four) times daily. 2 by mouth four times daily for indigestion.   CARBATROL 200 MG 12 HR CAPSULE    Give Magdalina 2 capsules in the morning and 1 capsule in the evening   CARBATROL 300 MG 12 HR CAPSULE    Take 1 capsule at bedtime along with the Carbatrol 200mg  capsule   CLONIDINE (CATAPRES) 0.2 MG TABLET    Take 0.2 mg by mouth at bedtime.   DEXTROMETHORPHAN-MENTHOL (DELSYM COUGH RELIEF MT)    Use as directed in the mouth or throat as needed (2 Tsp. by mouth every 12 hours as needed for cough.). Reported on 04/07/2015   ERYTHROMYCIN OPHTHALMIC OINTMENT    1 application 2 (two) times daily as needed (Apply to affected area twicw daily PRN).   FLUVOXAMINE (LUVOX) 50 MG TABLET    Take 50 mg by mouth 2 (two) times daily.   FUROSEMIDE (LASIX) 20 MG TABLET    Take 1 tablet by mouth daily.   GNP PAIN RELIEF EX-STRENGTH 500 MG TABLET    TAKE 2 TABLETS BY MOUTH 3 TIMES DAILY ASNEEDED FOR HEADACHE   HYDROCHLOROTHIAZIDE (HYDRODIURIL) 25 MG TABLET    TAKE 1  TABLET BY MOUTH ONCE DAILY.   HYDROCORTISONE (ANUSOL-HC) 2.5 % RECTAL CREAM    Place 1 application rectally 2 (two) times daily.   HYDROCORTISONE (ANUSOL-HC) 25 MG SUPPOSITORY    Place 25 mg rectally as needed for hemorrhoids.   MONTELUKAST (SINGULAIR) 10 MG TABLET    TAKE 1 TABLET BY MOUTH ONCE DAILY.   NAPROXEN (NAPROSYN) 500 MG TABLET    Take 1 tablet by mouth 2 (two) times daily. FOR ENTIRE MENSTRUAL CYCLE   OMEGA-3 ACID ETHYL ESTERS (LOVAZA) 1 G CAPSULE    Take 1 g by mouth daily.   OMEGA-3 FISH OIL (MAXEPA) 1000 MG CAPS CAPSULE    Take 1 capsule by mouth daily. 1 Cap every morning with food.   POLYETHYLENE GLYCOL POWDER (GLYCOLAX/MIRALAX) POWDER    MIX 17 GM IN 8 OZ OF WATER/JUICE AND DRINK EACH NIGHT AT BEDTIME.    POTASSIUM CHLORIDE (KLOR-CON) 20 MEQ PACKET    Dissolve contents of 1 packet in at least 4oz of water/juice and drink once daily.   SODIUM CHLORIDE (OCEAN) 0.65 % NASAL SPRAY    Place 1 spray into the nose as needed for congestion.   SODIUM FLUORIDE (DENTA 5000 PLUS) 1.1 % CREA DENTAL CREAM    Place 1 application onto teeth every evening. Brush teeth daily for 1 minute, rinse and spit.   SULFACETAMIDE (BLEPH-10) 10 % OPHTHALMIC SOLUTION    Apply to eye.   SULFACETAMIDE (BLEPH-10) 10 % OPHTHALMIC SOLUTION    Place 2 drops into both eyes every 4 (four) hours.   SUNSCREENS (AVEENO CLEAR COMPLEXION BB) CREA    Apply as directed bid   SYSTANE ULTRA 0.4-0.3 % SOLN    PLACE 1 DROP INTO EACH EYE AT BEDTIME AND UP TO 4 TIMES DAILY AS NEEDED FOR REDNESS.   TRIAMCINOLONE ACETONIDE, TOP, 0.05 % OINT    Apply topically 3 (three) times daily. Apply to rash as needed TID.   WITCH HAZEL (TUCKS) 50 % PADS    Apply 50 % topically as needed (As needed for cleansing and hemorrhoid discomfort.).    Review of Systems  Constitutional: Negative.   HENT: Positive for congestion. Negative for ear discharge, ear pain, nosebleeds, postnasal drip, rhinorrhea, sinus pressure, sneezing, sore throat, tinnitus, trouble swallowing and voice change.   Eyes: Positive for discharge. Negative for photophobia, pain, redness, itching and visual disturbance.  Respiratory: Negative.   Cardiovascular: Negative.   Gastrointestinal: Negative.   Neurological: Negative for dizziness, light-headedness and headaches.    Social History  Substance Use Topics  . Smoking status: Never Smoker   . Smokeless tobacco: Never Used  . Alcohol Use: No   Objective:   BP 98/64 mmHg  Pulse 76  Temp(Src) 99 F (37.2 C) (Oral)  Resp 16  Wt 169 lb (76.658 kg)  LMP 07/14/2015  Physical Exam  Constitutional: She appears well-developed and well-nourished.  HENT:  Head: Normocephalic and atraumatic.  Right Ear: Tympanic membrane, external ear and  ear canal normal.  Left Ear: Tympanic membrane, external ear and ear canal normal.  Nose: Mucosal edema present.  Mouth/Throat: Uvula is midline, oropharynx is clear and moist and mucous membranes are normal.  Cardiovascular: Normal rate, regular rhythm and normal heart sounds.   Pulmonary/Chest: Effort normal and breath sounds normal.  Psychiatric: She has a normal mood and affect. Her speech is delayed. She is slowed. Cognition and memory are impaired. She is communicative.      Assessment & Plan:      1.  Other seasonal allergic rhinitis Worsening; will treat with Flonase as below.  Pt's mom reports this has worked well in the past.   - fluticasone (FLONASE) 50 MCG/ACT nasal spray; Place 2 sprays into both nostrils daily.  Dispense: 48 g; Refill: 1   2. Epilepsy, not refractory (HCC) Stable; Will check Tegretol levels.  - Carbamazepine Level (Tegretol), total  3. Neutropenia, unspecified type (HCC) Will recheck labs. Continue to monitor. Recheck OV in 4 weeks.   - TSH - Comprehensive metabolic panel  Patient was seen and examined by Leo Grosser, MD, and note scribed by Kavin Leech, CMA.  I have reviewed the document for accuracy and completeness and I agree with above. - Leo Grosser, MD       Lorie Phenix, MD  [facility]

## 2015-07-25 DIAGNOSIS — D709 Neutropenia, unspecified: Secondary | ICD-10-CM | POA: Diagnosis not present

## 2015-07-25 DIAGNOSIS — G40909 Epilepsy, unspecified, not intractable, without status epilepticus: Secondary | ICD-10-CM | POA: Diagnosis not present

## 2015-07-25 DIAGNOSIS — R7309 Other abnormal glucose: Secondary | ICD-10-CM | POA: Diagnosis not present

## 2015-07-25 DIAGNOSIS — J302 Other seasonal allergic rhinitis: Secondary | ICD-10-CM | POA: Diagnosis not present

## 2015-07-25 DIAGNOSIS — E785 Hyperlipidemia, unspecified: Secondary | ICD-10-CM | POA: Diagnosis not present

## 2015-07-26 ENCOUNTER — Other Ambulatory Visit: Payer: Self-pay

## 2015-07-26 DIAGNOSIS — J302 Other seasonal allergic rhinitis: Secondary | ICD-10-CM

## 2015-07-26 LAB — CBC WITH DIFFERENTIAL/PLATELET
BASOS: 0 %
Basophils Absolute: 0 10*3/uL (ref 0.0–0.2)
EOS (ABSOLUTE): 0.1 10*3/uL (ref 0.0–0.4)
Eos: 3 %
Hematocrit: 40 % (ref 34.0–46.6)
Hemoglobin: 13.3 g/dL (ref 11.1–15.9)
Immature Grans (Abs): 0 10*3/uL (ref 0.0–0.1)
Immature Granulocytes: 0 %
Lymphocytes Absolute: 0.7 10*3/uL (ref 0.7–3.1)
Lymphs: 25 %
MCH: 30 pg (ref 26.6–33.0)
MCHC: 33.3 g/dL (ref 31.5–35.7)
MCV: 90 fL (ref 79–97)
MONOS ABS: 0.4 10*3/uL (ref 0.1–0.9)
Monocytes: 12 %
NEUTROS ABS: 1.7 10*3/uL (ref 1.4–7.0)
Neutrophils: 60 %
PLATELETS: 198 10*3/uL (ref 150–379)
RBC: 4.44 x10E6/uL (ref 3.77–5.28)
RDW: 13.3 % (ref 12.3–15.4)
WBC: 2.8 10*3/uL — ABNORMAL LOW (ref 3.4–10.8)

## 2015-07-26 LAB — COMPREHENSIVE METABOLIC PANEL
ALT: 10 IU/L (ref 0–32)
AST: 14 IU/L (ref 0–40)
Albumin/Globulin Ratio: 1.3 (ref 1.2–2.2)
Albumin: 3.9 g/dL (ref 3.5–5.5)
Alkaline Phosphatase: 83 IU/L (ref 39–117)
BUN/Creatinine Ratio: 14 (ref 9–23)
BUN: 10 mg/dL (ref 6–20)
CALCIUM: 9.3 mg/dL (ref 8.7–10.2)
CHLORIDE: 101 mmol/L (ref 96–106)
CO2: 23 mmol/L (ref 18–29)
Creatinine, Ser: 0.73 mg/dL (ref 0.57–1.00)
GFR, EST AFRICAN AMERICAN: 126 mL/min/{1.73_m2} (ref >59)
GFR, EST NON AFRICAN AMERICAN: 109 mL/min/{1.73_m2} (ref >59)
GLUCOSE: 104 mg/dL — AB (ref 65–99)
Globulin, Total: 3 g/dL (ref 1.5–4.5)
Potassium: 3.5 mmol/L (ref 3.5–5.2)
Sodium: 139 mmol/L (ref 134–144)
TOTAL PROTEIN: 6.9 g/dL (ref 6.0–8.5)

## 2015-07-26 LAB — LIPID PANEL
Chol/HDL Ratio: 2.8 ratio units (ref 0.0–4.4)
Cholesterol, Total: 248 mg/dL — ABNORMAL HIGH (ref 100–199)
HDL: 90 mg/dL (ref >39)
LDL Calculated: 151 mg/dL — ABNORMAL HIGH (ref 0–99)
Triglycerides: 36 mg/dL (ref 0–149)
VLDL Cholesterol Cal: 7 mg/dL (ref 5–40)

## 2015-07-26 LAB — TSH: TSH: 2.04 u[IU]/mL (ref 0.450–4.500)

## 2015-07-26 LAB — CARBAMAZEPINE LEVEL, TOTAL: Carbamazepine (Tegretol), S: 14.6 ug/mL (ref 4.0–12.0)

## 2015-07-26 MED ORDER — FLUTICASONE PROPIONATE 50 MCG/ACT NA SUSP: 2.0000 | Freq: Every day | NASAL | Status: DC

## 2015-07-28 ENCOUNTER — Other Ambulatory Visit: Payer: Self-pay | Admitting: Family Medicine

## 2015-07-28 LAB — HGB A1C W/O EAG: HEMOGLOBIN A1C: 5.8 % — AB (ref 4.8–5.6)

## 2015-07-28 LAB — CARBAMAZEPINE, FREE, SERUM: Carbamazepine, Free: 3.9 ug/mL (ref 0.6–4.2)

## 2015-07-28 LAB — SPECIMEN STATUS REPORT

## 2015-07-30 ENCOUNTER — Telehealth: Payer: Self-pay

## 2015-07-30 NOTE — Telephone Encounter (Signed)
-----   Message from Lorie PhenixNancy Maloney, MD sent at 07/28/2015  7:59 AM EDT ----- Blood sugar at upper limits of normal and carbamazepine, free is normal. Thanks.

## 2015-07-30 NOTE — Telephone Encounter (Signed)
[name] advised.   Thanks,   -Vernona RiegerLaura

## 2015-08-06 ENCOUNTER — Other Ambulatory Visit: Payer: Self-pay | Admitting: Family Medicine

## 2015-09-03 ENCOUNTER — Other Ambulatory Visit: Payer: Self-pay | Admitting: Family Medicine

## 2015-09-05 ENCOUNTER — Encounter: Payer: Self-pay | Admitting: Family Medicine

## 2015-09-05 ENCOUNTER — Ambulatory Visit (INDEPENDENT_AMBULATORY_CARE_PROVIDER_SITE_OTHER): Payer: Medicare Other | Admitting: Family Medicine

## 2015-09-05 VITALS — BP 110/64 | HR 80 | Temp 99.0°F | Resp 16

## 2015-09-05 DIAGNOSIS — K5900 Constipation, unspecified: Secondary | ICD-10-CM

## 2015-09-05 DIAGNOSIS — R799 Abnormal finding of blood chemistry, unspecified: Secondary | ICD-10-CM

## 2015-09-05 DIAGNOSIS — R569 Unspecified convulsions: Secondary | ICD-10-CM

## 2015-09-05 DIAGNOSIS — D709 Neutropenia, unspecified: Secondary | ICD-10-CM

## 2015-09-05 NOTE — Progress Notes (Signed)
Subjective:    Patient ID: Joanna Robinson, female    DOB: 1980/05/18, 35 y.o.   MRN: [id number] Chief Complaint  Patient presents with  . Neutropenia    4 week FU; labs were checked at [facility]. CBC was WNL.  Marland Kitchen Hyperglycemia    A1C was 5.8% on 07/30/2015.  Marland Kitchen Allergic Rhinitis     Pt started on Flonase at [facility]. Pt still c/o AR sx.    HPI  35 yo female with seizure disorder and autism here for recheck before I leave. She has been increasing her exercise and adjusting her diet under the guidance of her mother.  She lives at home with her mother. Taking her medication without any difficulty. No medication changes.  Congestion improved on her FLonase. Neutropenia unchanged. Has had work up for this previously.    Mom with no concerns today.   Really just visit to get used to me leaving. Chronic problems are all stable at this time, as noted above.  No fever.    Review of Systems  Constitutional: Positive for diaphoresis. Negative for fever, chills, activity change (exercising more), appetite change, fatigue and unexpected weight change.  HENT: Positive for congestion.   Respiratory: Negative for cough.    BP 110/64 mmHg  Pulse 80  Temp(Src) 99 F (37.2 C) (Oral)  Resp 16  LMP 08/08/2015   Patient Active Problem List   Diagnosis Date Noted  . Sinusitis, acute 12/21/2014  . Abnormal blood chemistry 12/20/2014  . Abscess of axilla 12/20/2014  . Dry eye syndrome 12/20/2014  . Dermoid inclusion cyst 12/20/2014  . Hematoma 12/20/2014  . HLD (hyperlipidemia) 12/20/2014  . Decreased potassium in the blood 12/20/2014  . Irregular bleeding 12/20/2014  . Decreased leukocytes 12/20/2014  . Malaise and fatigue 12/20/2014  . Seizure (HCC) 12/20/2014  . Headache 12/18/2014  . Acne 10/30/2014  . Constipation 09/18/2014  . Neutropenia (HCC) 09/18/2014  . Hypokalemia 09/13/2014  . Elevated carbamazepine level 01/25/2014  . Partial epilepsy with impairment of consciousness (HCC) 01/10/2013  .  Active autistic disorder 01/10/2013  . Long-term use of high-risk medication 01/10/2013  . Dysmenorrhea 07/10/2008  . Mechanical and motor problems with internal organs 11/08/2007  . Colonic constipation 11/03/2007  . Allergic rhinitis 08/20/2007  . External hemorrhoids without complication 11/13/2006  . Edema 12/26/2003  . Epilepsy, not refractory (HCC) 12/26/2003  . Intellectual disability 12/26/2003   Past Medical History  Diagnosis Date  . Seizures (HCC)   . Autism   . Epilepsy (HCC)   . Hyperlipidemia   . Hemorrhoid    Current Outpatient Prescriptions on File Prior to Visit  Medication Sig  . acetaZOLAMIDE (DIAMOX) 250 MG tablet Take 1 tab by mouth twice daily  . ALLEGRA ALLERGY 180 MG tablet TAKE 1 TABLET BY MOUTH ONCE DAILY.  Marland Kitchen Alpha-D-Galactosidase (BEANO) TABS Take by mouth as needed (2-3 Tabs as needed prior to meal that may cause bloating.).  Marland Kitchen Aluminum & Magnesium Hydroxide (MYLANTA ULTIMATE STRENGTH) 500-500 MG/5ML SUSP MYLANTA ULTIMATE STRENGTH, 500-500MG /5ML (Oral Suspension)  1 TID as needed for 0 days  Quantity: 0.00;  Refills: 0   Ordered :14-Mar-2010  Denna Haggard, MA, Anastasiya ;  Started 19-October-2008 Active Comments: Medication taken as needed.   . benzoyl peroxide 5 % gel Apply 5 % topically daily. Apply to face every morning.  . Ca Carbonate-Mag Hydroxide (MYLANTA ULTRA) 700-300 MG CHEW Chew by mouth 3 (three) times daily. Chew 1 by mouth three times daily as needed for abdominal pain.  Marland Kitchen  calcium carbonate (TUMS E-X 750) 750 MG chewable tablet Chew 2 tablets by mouth 4 (four) times daily. 2 by mouth four times daily for indigestion.  Marland Kitchen. CARBATROL 200 MG 12 hr capsule Give Kaytee 2 capsules in the morning and 1 capsule in the evening  . CARBATROL 300 MG 12 hr capsule Take 1 capsule at bedtime along with the Carbatrol 200mg  capsule  . cloNIDine (CATAPRES) 0.2 MG tablet Take 0.2 mg by mouth at bedtime.  Marland Kitchen. Dextromethorphan-Menthol (DELSYM COUGH RELIEF MT) Use  as directed in the mouth or throat as needed (2 Tsp. by mouth every 12 hours as needed for cough.). Reported on 04/07/2015  . erythromycin ophthalmic ointment 1 application 2 (two) times daily as needed (Apply to affected area twicw daily PRN).  . fluticasone (FLONASE) 50 MCG/ACT nasal spray Place 2 sprays into both nostrils daily.  . fluvoxaMINE (LUVOX) 50 MG tablet Take 50 mg by mouth 2 (two) times daily.  . furosemide (LASIX) 20 MG tablet Take 1 tablet by mouth daily.  Marland Kitchen. GNP PAIN RELIEF EX-STRENGTH 500 MG tablet TAKE 2 TABLETS BY MOUTH 3 TIMES DAILY ASNEEDED FOR HEADACHE  . hydrochlorothiazide (HYDRODIURIL) 25 MG tablet TAKE 1 TABLET BY MOUTH ONCE DAILY.  . hydrocortisone (ANUSOL-HC) 2.5 % rectal cream Place 1 application rectally 2 (two) times daily.  . hydrocortisone (ANUSOL-HC) 25 MG suppository Place 25 mg rectally as needed for hemorrhoids.  . montelukast (SINGULAIR) 10 MG tablet TAKE 1 TABLET BY MOUTH ONCE DAILY.  . naproxen (NAPROSYN) 500 MG tablet Take 1 tablet by mouth 2 (two) times daily. FOR ENTIRE MENSTRUAL CYCLE  . omega-3 acid ethyl esters (LOVAZA) 1 G capsule Take 1 g by mouth daily.  Marland Kitchen. omega-3 fish oil (MAXEPA) 1000 MG CAPS capsule Take 1 capsule by mouth daily. 1 Cap every morning with food.  . polyethylene glycol powder (GLYCOLAX/MIRALAX) powder MIX 17 GM IN 8 OZ OF WATER/JUICE AND DRINK EACH NIGHT AT BEDTIME.  Marland Kitchen. potassium chloride (KLOR-CON) 20 MEQ packet DISSOLVE CONTENTS OF 1 PACKET IN AT LEAST 4 OZ OF WATER/JUICE AND DRINK ONCEDAILY  . sodium chloride (OCEAN) 0.65 % nasal spray Place 1 spray into the nose as needed for congestion.  . sodium fluoride (DENTA 5000 PLUS) 1.1 % CREA dental cream Place 1 application onto teeth every evening. Brush teeth daily for 1 minute, rinse and spit.  Marland Kitchen. sulfacetamide (BLEPH-10) 10 % ophthalmic solution Apply to eye.  . sulfacetamide (BLEPH-10) 10 % ophthalmic solution Place 2 drops into both eyes every 4 (four) hours.  . Sunscreens (AVEENO  CLEAR COMPLEXION BB) CREA Apply as directed bid  . SYSTANE ULTRA 0.4-0.3 % SOLN PLACE 1 DROP INTO EACH EYE AT BEDTIME AND UP TO 4 TIMES DAILY AS NEEDED FOR REDNESS.  Marland Kitchen. TRIAMCINOLONE ACETONIDE, TOP, 0.05 % OINT Apply topically 3 (three) times daily. Apply to rash as needed TID.  Marland Kitchen. triamcinolone cream (KENALOG) 0.5 % APPLY TOPICALLY TO RASH 3 TIMES DAILY ASNEEDED.  Marland Kitchen. Witch Hazel (TUCKS) 50 % PADS Apply 50 % topically as needed (As needed for cleansing and hemorrhoid discomfort.).   No current facility-administered medications on file prior to visit.   Allergies  Allergen Reactions  . Codeine   . Corticosteroids Nausea Only   No past surgical history on file. Social History   Social History  . Marital Status: Single    Spouse Name: N/A  . Number of Children: N/A  . Years of Education: N/A   Occupational History  . Not on file.  Social History Main Topics  . Smoking status: Never Smoker   . Smokeless tobacco: Never Used  . Alcohol Use: No  . Drug Use: No  . Sexual Activity: No   Other Topics Concern  . 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 She has a nurse that comes to her home and they go out on field trips. She enjoys exercise, dance, and riding bike. She lives with her mother.   Family History  Problem Relation Age of Onset  . Alzheimer's disease Maternal Grandmother     Died at 43  . Alzheimer's disease Paternal Grandfather     Died at 42       Objective:   Physical Exam  Constitutional: She is oriented to person, place, and time. She appears well-developed and well-nourished.  HENT:  Mouth/Throat: Mucous membranes are normal.  Cardiovascular: Normal rate, regular rhythm and normal heart sounds.   Pulmonary/Chest: Effort normal and breath sounds normal.  Neurological: She is alert and oriented to person, place, and time.  Psychiatric: She has a normal mood and affect. Her speech is delayed. She is slowed. Cognition and  memory are impaired. She is communicative.   BP 110/64 mmHg  Pulse 80  Temp(Src) 99 F (37.2 C) (Oral)  Resp 16  LMP 08/08/2015      Assessment & Plan:  1. Neutropenia, unspecified type (HCC) Unchanged. Recheck in fall with new provider. Will follow up with  [facility].    2. Seizure (HCC) Levels stable. Continue current medication and follow up with neurology as scheduled.   3. Constipation, unspecified constipation type Stable on current medication.    4. Abnormal blood chemistry Borderline blood sugar. Has adjusted diet and exercise. Recheck in 3 months.   Lorie Phenix, MD

## 2015-09-24 ENCOUNTER — Ambulatory Visit (INDEPENDENT_AMBULATORY_CARE_PROVIDER_SITE_OTHER): Payer: Medicare Other | Admitting: Family Medicine

## 2015-09-24 ENCOUNTER — Telehealth: Payer: Self-pay

## 2015-09-24 ENCOUNTER — Ambulatory Visit: Payer: Medicare Other | Admitting: Family Medicine

## 2015-09-24 ENCOUNTER — Encounter: Payer: Self-pay | Admitting: Family Medicine

## 2015-09-24 VITALS — BP 98/62 | HR 80 | Temp 99.3°F | Resp 16

## 2015-09-24 DIAGNOSIS — H109 Unspecified conjunctivitis: Secondary | ICD-10-CM

## 2015-09-24 MED ORDER — SULFACETAMIDE SODIUM 10 % OP SOLN: OPHTHALMIC | Status: DC

## 2015-09-24 NOTE — Patient Instructions (Signed)
Discussed use of warm wet compresses several x day.

## 2015-09-24 NOTE — Telephone Encounter (Signed)
Changed Bleph-10 to Ocuflox 2 gtts QID in affected eye 5 cc. Mother notified and advised to recheck if no better in 2-3 days.

## 2015-09-24 NOTE — Telephone Encounter (Signed)
Patient's mother is requesting a medication that Joanna Robinson can take until her eye drops get delivered. Mother reports that the eye drops wont be available until Thursday. I told her that I would try and call a different pharmacy and see if they have the medication. Mr. Laural BenesJohnson asked me to call [facility]. On [address]   I called [facility] and they wont have the eye drops until Wednesday. Please advise.

## 2015-09-24 NOTE — Progress Notes (Signed)
Subjective:     Patient ID: Joanna Robinson, female   DOB: [birth year], 35 y.o.   MRN: [id number]  HPI  Chief Complaint  Patient presents with  . Eye Pain    Patient has redness and pain in her right eye X 2 days. Patient has been taking OTC Tylenol for the symptoms.   Accompanied by her mom today. States she has been also using an otc allergy drop.   Review of Systems     Objective:   Physical Exam  Constitutional: She appears well-developed and well-nourished. No distress.  HENT:  Right visual acuity is intact to counting # of fingers.  Eyes: Pupils are equal, round, and reactive to light. Left eye exhibits no discharge (no redness on presentation.).       Assessment:    1. Conjunctivitis of right eye - sulfacetamide (BLEPH-10) 10 % ophthalmic solution; Apply two drops to right eye 4 x day.  Dispense: 15 mL; Refill: 0      Plan:    Discussed frequent use of warm, wet, compresses.

## 2015-09-26 ENCOUNTER — Ambulatory Visit: Payer: Medicare Other | Admitting: Family Medicine

## 2015-09-28 ENCOUNTER — Ambulatory Visit (INDEPENDENT_AMBULATORY_CARE_PROVIDER_SITE_OTHER): Payer: Medicare Other | Admitting: Family Medicine

## 2015-09-28 ENCOUNTER — Encounter: Payer: Self-pay | Admitting: Family Medicine

## 2015-09-28 VITALS — BP 110/70 | Temp 98.6°F | Resp 16

## 2015-09-28 DIAGNOSIS — J302 Other seasonal allergic rhinitis: Secondary | ICD-10-CM | POA: Diagnosis not present

## 2015-09-28 DIAGNOSIS — H109 Unspecified conjunctivitis: Secondary | ICD-10-CM

## 2015-09-28 DIAGNOSIS — F840 Autistic disorder: Secondary | ICD-10-CM

## 2015-09-28 NOTE — Progress Notes (Signed)
Patient: Joanna Robinson Female    DOB: 11-24-1980   35 y.o.   MRN: [id number] Visit Date: 09/28/2015  Today's Provider: Dortha Kernennis Chrismon, PA   Chief Complaint  Patient presents with  . eye redness   Subjective:    HPI Patient comes in today c/o eye redness in the right eye. She was seen in the office on 09/24/2015 with similar symptoms. Patient's mother reports that redness cleared and now starting to have allergy symptoms.    Past Medical History  Diagnosis Date  . Seizures (HCC)   . Autism   . Epilepsy (HCC)   . Hyperlipidemia   . Hemorrhoid    No past surgical history on file.   Family History  Problem Relation Age of Onset  . Alzheimer's disease Maternal Grandmother     Died at 7577  . Alzheimer's disease Paternal Grandfather     Died at 2082    Allergies  Allergen Reactions  . Codeine   . Corticosteroids Nausea Only   Current Meds  Medication Sig  . acetaZOLAMIDE (DIAMOX) 250 MG tablet Take 1 tab by mouth twice daily  . ALLEGRA ALLERGY 180 MG tablet TAKE 1 TABLET BY MOUTH ONCE DAILY.  Marland Kitchen. Alpha-D-Galactosidase (BEANO) TABS Take by mouth as needed (2-3 Tabs as needed prior to meal that may cause bloating.).  Marland Kitchen. Aluminum & Magnesium Hydroxide (MYLANTA ULTIMATE STRENGTH) 500-500 MG/5ML SUSP MYLANTA ULTIMATE STRENGTH, 500-500MG /5ML (Oral Suspension)  1 TID as needed for 0 days  Quantity: 0.00;  Refills: 0   Ordered :14-Mar-2010  Denna HaggardAleksandrova, MA, Anastasiya ;  Started 19-October-2008 Active Comments: Medication taken as needed.   . benzoyl peroxide 5 % gel Apply 5 % topically daily. Apply to face every morning.  . Ca Carbonate-Mag Hydroxide (MYLANTA ULTRA) 700-300 MG CHEW Chew by mouth 3 (three) times daily. Chew 1 by mouth three times daily as needed for abdominal pain.  . calcium carbonate (TUMS E-X 750) 750 MG chewable tablet Chew 2 tablets by mouth 4 (four) times daily. 2 by mouth four times daily for indigestion.  Marland Kitchen. CARBATROL 200 MG 12 hr capsule Give Romeka 2  capsules in the morning and 1 capsule in the evening  . CARBATROL 300 MG 12 hr capsule Take 1 capsule at bedtime along with the Carbatrol 200mg  capsule  . cloNIDine (CATAPRES) 0.2 MG tablet Take 0.2 mg by mouth at bedtime.  Marland Kitchen. Dextromethorphan-Menthol (DELSYM COUGH RELIEF MT) Use as directed in the mouth or throat as needed (2 Tsp. by mouth every 12 hours as needed for cough.). Reported on 04/07/2015  . erythromycin ophthalmic ointment 1 application 2 (two) times daily as needed (Apply to affected area twicw daily PRN).  . fluticasone (FLONASE) 50 MCG/ACT nasal spray Place 2 sprays into both nostrils daily.  . fluvoxaMINE (LUVOX) 50 MG tablet Take 50 mg by mouth 2 (two) times daily.  . furosemide (LASIX) 20 MG tablet Take 1 tablet by mouth daily.  Marland Kitchen. GNP PAIN RELIEF EX-STRENGTH 500 MG tablet TAKE 2 TABLETS BY MOUTH 3 TIMES DAILY ASNEEDED FOR HEADACHE  . hydrochlorothiazide (HYDRODIURIL) 25 MG tablet TAKE 1 TABLET BY MOUTH ONCE DAILY.  . hydrocortisone (ANUSOL-HC) 2.5 % rectal cream Place 1 application rectally 2 (two) times daily.  . hydrocortisone (ANUSOL-HC) 25 MG suppository Place 25 mg rectally as needed for hemorrhoids.  . montelukast (SINGULAIR) 10 MG tablet TAKE 1 TABLET BY MOUTH ONCE DAILY.  . naproxen (NAPROSYN) 500 MG tablet Take 1 tablet by mouth 2 (  two) times daily. FOR ENTIRE MENSTRUAL CYCLE  . omega-3 acid ethyl esters (LOVAZA) 1 G capsule Take 1 g by mouth daily.  Marland Kitchen. omega-3 fish oil (MAXEPA) 1000 MG CAPS capsule Take 1 capsule by mouth daily. 1 Cap every morning with food.  . polyethylene glycol powder (GLYCOLAX/MIRALAX) powder MIX 17 GM IN 8 OZ OF WATER/JUICE AND DRINK EACH NIGHT AT BEDTIME.  Marland Kitchen. potassium chloride (KLOR-CON) 20 MEQ packet DISSOLVE CONTENTS OF 1 PACKET IN AT LEAST 4 OZ OF WATER/JUICE AND DRINK ONCEDAILY  . sodium chloride (OCEAN) 0.65 % nasal spray Place 1 spray into the nose as needed for congestion.  . sodium fluoride (DENTA 5000 PLUS) 1.1 % CREA dental cream Place 1  application onto teeth every evening. Brush teeth daily for 1 minute, rinse and spit.  Marland Kitchen. sulfacetamide (BLEPH-10) 10 % ophthalmic solution Apply two drops to right eye 4 x day.  . Sunscreens (AVEENO CLEAR COMPLEXION BB) CREA Apply as directed bid  . SYSTANE ULTRA 0.4-0.3 % SOLN PLACE 1 DROP INTO EACH EYE AT BEDTIME AND UP TO 4 TIMES DAILY AS NEEDED FOR REDNESS.  Marland Kitchen. TRIAMCINOLONE ACETONIDE, TOP, 0.05 % OINT Apply topically 3 (three) times daily. Apply to rash as needed TID.  Marland Kitchen. triamcinolone cream (KENALOG) 0.5 % APPLY TOPICALLY TO RASH 3 TIMES DAILY ASNEEDED.  Marland Kitchen. Witch Hazel (TUCKS) 50 % PADS Apply 50 % topically as needed (As needed for cleansing and hemorrhoid discomfort.).    Review of Systems  Constitutional: Negative.   HENT: Positive for congestion, ear pain and postnasal drip.   Eyes: Positive for redness and itching.  Respiratory: Negative.   Cardiovascular: Negative.     Social History  Substance Use Topics  . Smoking status: Never Smoker   . Smokeless tobacco: Never Used  . Alcohol Use: No   Objective:   BP 110/70 mmHg  Temp(Src) 98.6 F (37 C)  Resp 16  Wt   LMP 09/08/2015 (Approximate)  Physical Exam  Constitutional: She is oriented to person, place, and time. She appears well-developed and well-nourished. No distress.  HENT:  Head: Normocephalic and atraumatic.  Right Ear: Hearing and external ear normal.  Left Ear: Hearing and external ear normal.  Nose: Nose normal.  Mouth/Throat: Oropharynx is clear and moist.  Eyes: Conjunctivae, EOM and lids are normal. Right eye exhibits no discharge. Left eye exhibits no discharge. No scleral icterus.  Neck: Neck supple.  Cardiovascular: Normal rate and regular rhythm.   Pulmonary/Chest: Effort normal and breath sounds normal. No respiratory distress.  Abdominal: Bowel sounds are normal.  Musculoskeletal: Normal range of motion.  Neurological: She is alert and oriented to person, place, and time.  Skin: Skin is intact.  No lesion and no rash noted.  Psychiatric: Her speech is normal.  Bizarre outbursts and loud speech. Cooperative with exam.      Assessment & Plan:     1. Conjunctivitis of right eye Used the Ocuflox and redness has cleared. No drainage from the eyes. Unable to cooperate with vision testing. May use eyedrops for 2 more days if needed. Recheck if any return of symptoms. May need referral to ophthalmologist.   2. Other seasonal allergic rhinitis Mild congestion symptoms and may be cause for some of the eye irritation. Recommend using the Flonase and antihistamine she normally uses for flares.  3. Active autistic disorder Stable and well controlled.      Joanna Kernennis Chrismon, PA  [facility]

## 2015-09-28 NOTE — Patient Instructions (Signed)
Allergic Rhinitis Allergic rhinitis is when the mucous membranes in the nose respond to allergens. Allergens are particles in the air that cause your body to have an allergic reaction. This causes you to release allergic antibodies. Through a chain of events, these eventually cause you to release histamine into the blood stream. Although meant to protect the body, it is this release of histamine that causes your discomfort, such as frequent sneezing, congestion, and an itchy, runny nose.  CAUSES Seasonal allergic rhinitis (hay fever) is caused by pollen allergens that may come from grasses, trees, and weeds. Year-round allergic rhinitis (perennial allergic rhinitis) is caused by allergens such as house dust mites, pet dander, and mold spores. SYMPTOMS  Nasal stuffiness (congestion).  Itchy, runny nose with sneezing and tearing of the eyes. DIAGNOSIS Your health care provider can help you determine the allergen or allergens that trigger your symptoms. If you and your health care provider are unable to determine the allergen, skin or blood testing may be used. Your health care provider will diagnose your condition after taking your health history and performing a physical exam. Your health care provider may assess you for other related conditions, such as asthma, pink eye, or an ear infection. TREATMENT Allergic rhinitis does not have a cure, but it can be controlled by:  Medicines that block allergy symptoms. These may include allergy shots, nasal sprays, and oral antihistamines.  Avoiding the allergen. Hay fever may often be treated with antihistamines in pill or nasal spray forms. Antihistamines block the effects of histamine. There are over-the-counter medicines that may help with nasal congestion and swelling around the eyes. Check with your health care provider before taking or giving this medicine. If avoiding the allergen or the medicine prescribed do not work, there are many new medicines  your health care provider can prescribe. Stronger medicine may be used if initial measures are ineffective. Desensitizing injections can be used if medicine and avoidance does not work. Desensitization is when a patient is given ongoing shots until the body becomes less sensitive to the allergen. Make sure you follow up with your health care provider if problems continue. HOME CARE INSTRUCTIONS It is not possible to completely avoid allergens, but you can reduce your symptoms by taking steps to limit your exposure to them. It helps to know exactly what you are allergic to so that you can avoid your specific triggers. SEEK MEDICAL CARE IF:  You have a fever.  You develop a cough that does not stop easily (persistent).  You have shortness of breath.  You start wheezing.  Symptoms interfere with normal daily activities.   This information is not intended to replace advice given to you by your health care provider. Make sure you discuss any questions you have with your health care provider.   Document Released: 12/03/2000 Document Revised: 03/31/2014 Document Reviewed: 11/15/2012 Elsevier Interactive Patient Education 2016 [employer] Inc.  

## 2015-10-02 ENCOUNTER — Other Ambulatory Visit: Payer: Self-pay | Admitting: Family Medicine

## 2015-11-01 ENCOUNTER — Other Ambulatory Visit: Payer: Self-pay | Admitting: Family Medicine

## 2015-11-01 DIAGNOSIS — R609 Edema, unspecified: Secondary | ICD-10-CM

## 2015-11-28 ENCOUNTER — Other Ambulatory Visit: Payer: Self-pay | Admitting: Family Medicine

## 2015-11-28 DIAGNOSIS — K5900 Constipation, unspecified: Secondary | ICD-10-CM

## 2015-12-07 ENCOUNTER — Encounter: Payer: Self-pay | Admitting: Physician Assistant

## 2015-12-07 ENCOUNTER — Ambulatory Visit (INDEPENDENT_AMBULATORY_CARE_PROVIDER_SITE_OTHER): Payer: Medicare Other | Admitting: Physician Assistant

## 2015-12-07 VITALS — BP 108/64 | HR 80 | Temp 99.1°F | Resp 16

## 2015-12-07 DIAGNOSIS — H109 Unspecified conjunctivitis: Secondary | ICD-10-CM | POA: Diagnosis not present

## 2015-12-07 DIAGNOSIS — D709 Neutropenia, unspecified: Secondary | ICD-10-CM | POA: Diagnosis not present

## 2015-12-07 DIAGNOSIS — J0140 Acute pansinusitis, unspecified: Secondary | ICD-10-CM | POA: Diagnosis not present

## 2015-12-07 DIAGNOSIS — Z79899 Other long term (current) drug therapy: Secondary | ICD-10-CM

## 2015-12-07 DIAGNOSIS — L700 Acne vulgaris: Secondary | ICD-10-CM | POA: Diagnosis not present

## 2015-12-07 DIAGNOSIS — R7309 Other abnormal glucose: Secondary | ICD-10-CM | POA: Diagnosis not present

## 2015-12-07 MED ORDER — SULFACETAMIDE SODIUM 10 % OP SOLN: OPHTHALMIC | 0 refills | Status: DC

## 2015-12-07 MED ORDER — BENZOYL PEROXIDE-ERYTHROMYCIN 5-3 % EX GEL: Freq: Two times a day (BID) | CUTANEOUS | 0 refills | Status: DC

## 2015-12-07 MED ORDER — AMOXICILLIN-POT CLAVULANATE 875-125 MG PO TABS: 1.0000 | ORAL_TABLET | Freq: Two times a day (BID) | ORAL | 0 refills | Status: DC

## 2015-12-07 NOTE — Patient Instructions (Signed)

## 2015-12-07 NOTE — Progress Notes (Signed)
Patient: Joanna Robinson Female    DOB: 10/03/1980   35 y.o.   MRN: [id number] Visit Date: 12/07/2015  Today's Provider: Margaretann LovelessJennifer M Ocie Tino, PA-C   Chief Complaint  Patient presents with  . Follow-up   Subjective:    HPI  Patient comes in today for a 3 month follow up on Neutropenia and tegretol level check.   Patient's mother reports that she still has problems with her allergies. She is using allegra and flonase daily. She is still having to use the eye drops for conjunctivitis. The eyes improve when she is using them but it returns once she stops. Patient does complain of her left eye and nose bothering her. She is also noticing her acne flaring again at this time. She has used benzoyl peroxide-erythromycin before successfully.   Patient's mother reports that she does have an appt with the eye doctor next month as well. She also sees her Neurologist on 12/26/15.     Allergies  Allergen Reactions  . Codeine   . Corticosteroids Nausea Only     Current Outpatient Prescriptions:  .  acetaZOLAMIDE (DIAMOX) 250 MG tablet, Take 1 tab by mouth twice daily, Disp: 60 tablet, Rfl: 5 .  ALLEGRA ALLERGY 180 MG tablet, TAKE 1 TABLET BY MOUTH ONCE DAILY., Disp: 30 tablet, Rfl: 11 .  Alpha-D-Galactosidase (BEANO) TABS, Take by mouth as needed (2-3 Tabs as needed prior to meal that may cause bloating.)., Disp: , Rfl:  .  Aluminum & Magnesium Hydroxide (MYLANTA ULTIMATE STRENGTH) 500-500 MG/5ML SUSP, MYLANTA ULTIMATE STRENGTH, 500-500MG /5ML (Oral Suspension)  1 TID as needed for 0 days  Quantity: 0.00;  Refills: 0   Ordered :14-Mar-2010  Denna HaggardAleksandrova, MA, Anastasiya ;  Started 19-October-2008 Active Comments: Medication taken as needed. , Disp: , Rfl:  .  benzoyl peroxide 5 % gel, Apply 5 % topically daily. Apply to face every morning., Disp: , Rfl:  .  Ca Carbonate-Mag Hydroxide (MYLANTA ULTRA) 700-300 MG CHEW, Chew by mouth 3 (three) times daily. Chew 1 by mouth three times daily as needed  for abdominal pain., Disp: , Rfl:  .  calcium carbonate (TUMS E-X 750) 750 MG chewable tablet, Chew 2 tablets by mouth 4 (four) times daily. 2 by mouth four times daily for indigestion., Disp: , Rfl:  .  CARBATROL 200 MG 12 hr capsule, Give Janella 2 capsules in the morning and 1 capsule in the evening, Disp: 90 capsule, Rfl: 5 .  CARBATROL 300 MG 12 hr capsule, Take 1 capsule at bedtime along with the Carbatrol 200mg  capsule, Disp: 30 capsule, Rfl: 5 .  cloNIDine (CATAPRES) 0.2 MG tablet, Take 0.2 mg by mouth at bedtime., Disp: , Rfl:  .  Dextromethorphan-Menthol (DELSYM COUGH RELIEF MT), Use as directed in the mouth or throat as needed (2 Tsp. by mouth every 12 hours as needed for cough.). Reported on 04/07/2015, Disp: , Rfl:  .  erythromycin ophthalmic ointment, 1 application 2 (two) times daily as needed (Apply to affected area twicw daily PRN)., Disp: , Rfl:  .  fluticasone (FLONASE) 50 MCG/ACT nasal spray, Place 2 sprays into both nostrils daily., Disp: 48 g, Rfl: 1 .  fluvoxaMINE (LUVOX) 50 MG tablet, Take 50 mg by mouth 2 (two) times daily., Disp: , Rfl:  .  furosemide (LASIX) 20 MG tablet, Take 1 tablet by mouth daily., Disp: , Rfl:  .  GNP PAIN RELIEF EX-STRENGTH 500 MG tablet, TAKE 2 TABLETS BY MOUTH 3 TIMES DAILY ASNEEDED  FOR HEADACHE, Disp: 30 tablet, Rfl: 5 .  hydrochlorothiazide (HYDRODIURIL) 25 MG tablet, TAKE 1 TABLET BY MOUTH ONCE DAILY., Disp: 90 tablet, Rfl: 1 .  hydrocortisone (ANUSOL-HC) 2.5 % rectal cream, Place 1 application rectally 2 (two) times daily., Disp: , Rfl:  .  hydrocortisone (ANUSOL-HC) 25 MG suppository, Place 25 mg rectally as needed for hemorrhoids., Disp: , Rfl:  .  montelukast (SINGULAIR) 10 MG tablet, TAKE 1 TABLET BY MOUTH ONCE DAILY., Disp: 90 tablet, Rfl: 1 .  naproxen (NAPROSYN) 500 MG tablet, TAKE 1 TABLET BY MOUTH TWICE DAILY FOR ENTIRE MENSTRUAL CYCLE., Disp: 20 tablet, Rfl: 5 .  omega-3 acid ethyl esters (LOVAZA) 1 G capsule, Take 1 g by mouth daily.,  Disp: , Rfl:  .  omega-3 fish oil (MAXEPA) 1000 MG CAPS capsule, Take 1 capsule by mouth daily. 1 Cap every morning with food., Disp: , Rfl:  .  polyethylene glycol powder (GLYCOLAX/MIRALAX) powder, MIX 17 GM IN 8 OZ OF WATER/JUICE AND DRINK EACH NIGHT AT BEDTIME., Disp: 255 g, Rfl: 5 .  potassium chloride (KLOR-CON) 20 MEQ packet, DISSOLVE CONTENTS OF 1 PACKET IN AT LEAST 4 OZ OF WATER/JUICE AND DRINK ONCEDAILY, Disp: 30 packet, Rfl: 5 .  sodium chloride (OCEAN) 0.65 % nasal spray, Place 1 spray into the nose as needed for congestion., Disp: , Rfl:  .  sodium fluoride (DENTA 5000 PLUS) 1.1 % CREA dental cream, Place 1 application onto teeth every evening. Brush teeth daily for 1 minute, rinse and spit., Disp: 1 Tube, Rfl: 5 .  sulfacetamide (BLEPH-10) 10 % ophthalmic solution, Apply two drops to right eye 4 x day., Disp: 15 mL, Rfl: 0 .  Sunscreens (AVEENO CLEAR COMPLEXION BB) CREA, Apply as directed bid, Disp: 75 mL, Rfl: 11 .  SYSTANE ULTRA 0.4-0.3 % SOLN, PLACE 1 DROP INTO EACH EYE AT BEDTIME AND UP TO 4 TIMES DAILY AS NEEDED FOR REDNESS., Disp: 10 mL, Rfl: 5 .  TRIAMCINOLONE ACETONIDE, TOP, 0.05 % OINT, Apply topically 3 (three) times daily. Apply to rash as needed TID., Disp: , Rfl:  .  triamcinolone cream (KENALOG) 0.5 %, APPLY TOPICALLY TO RASH 3 TIMES DAILY ASNEEDED., Disp: 60 g, Rfl: 5 .  Witch Hazel (TUCKS) 50 % PADS, Apply 50 % topically as needed (As needed for cleansing and hemorrhoid discomfort.)., Disp: , Rfl:   Review of Systems  Constitutional: Negative.   HENT: Positive for congestion, postnasal drip, rhinorrhea and sinus pressure. Negative for ear pain, sneezing, sore throat, tinnitus and trouble swallowing.   Eyes: Positive for discharge, redness and itching. Negative for visual disturbance.  Respiratory: Negative for cough, chest tightness and shortness of breath.   Cardiovascular: Negative for chest pain, palpitations and leg swelling.  Gastrointestinal: Negative for  abdominal pain.  Neurological: Negative for dizziness and headaches.    Social History  Substance Use Topics  . Smoking status: Never Smoker  . Smokeless tobacco: Never Used  . Alcohol use No   Objective:   There were no vitals taken for this visit.  Physical Exam  Constitutional: She appears well-developed and well-nourished. No distress.  HENT:  Head: Normocephalic and atraumatic.  Right Ear: Hearing, tympanic membrane, external ear and ear canal normal.  Left Ear: Hearing, tympanic membrane, external ear and ear canal normal.  Nose: Mucosal edema and rhinorrhea present. Right sinus exhibits maxillary sinus tenderness and frontal sinus tenderness. Left sinus exhibits maxillary sinus tenderness and frontal sinus tenderness.  Mouth/Throat: Uvula is midline, oropharynx is clear and moist  and mucous membranes are normal. No oropharyngeal exudate.  Neck: Normal range of motion. Neck supple. No tracheal deviation present. No thyromegaly present.  Cardiovascular: Normal rate, regular rhythm and normal heart sounds.  Exam reveals no gallop and no friction rub.   No murmur heard. Pulmonary/Chest: Effort normal and breath sounds normal. No stridor. No respiratory distress. She has no wheezes. She has no rales.  Lymphadenopathy:    She has no cervical adenopathy.  Skin: She is not diaphoretic.  Vitals reviewed.     Assessment & Plan:     1. Acute pansinusitis, recurrence not specified Worsening symptoms even with using allergy medications. Will give augmentin as below. She is to call if symptoms worsen. - amoxicillin-clavulanate (AUGMENTIN) 875-125 MG tablet; Take 1 tablet by mouth 2 (two) times daily.  Dispense: 14 tablet; Refill: 0  2. Conjunctivitis of right eye Refilled in case it is needed following antibiotic use for sinusitis.  - sulfacetamide (BLEPH-10) 10 % ophthalmic solution; Apply two drops to right eye 4 x day.  Dispense: 15 mL; Refill: 0  3. Acne vulgaris Previously  used successfully. Will reorder as below.  - benzoyl peroxide-erythromycin (BENZAMYCIN) gel; Apply topically 2 (two) times daily.  Dispense: 23.3 g; Refill: 0  4. Neutropenia, unspecified type (HCC) Will check labs as below and f/u pending results. I will see her back in 3 months.  - CBC with Differential  5. Long-term use of high-risk medication Will check labs as below and f/u pending results. - Carbamazepine, Free and Total  6. Elevated hemoglobin A1c Will check labs as below and f/u pending results. - HgB A1c       Margaretann Loveless, PA-C  [facility]

## 2015-12-10 ENCOUNTER — Telehealth: Payer: Self-pay

## 2015-12-10 NOTE — Telephone Encounter (Signed)
Left message to call back  

## 2015-12-10 NOTE — Telephone Encounter (Signed)
-----   Message from Margaretann LovelessJennifer M Burnette, [place]-C sent at 12/10/2015  1:18 PM EDT ----- Her HgBA1c has improved from 5.8 to 5.4 so keep up the good work. Also WBC is up to 3.5! That is great! We will continue to monitor regularly. Also carbamazepine level is still pending. Will notify once I receive that result also.

## 2015-12-11 LAB — CBC WITH DIFFERENTIAL/PLATELET
BASOS ABS: 0 10*3/uL (ref 0.0–0.2)
Basos: 1 %
EOS (ABSOLUTE): 0.2 10*3/uL (ref 0.0–0.4)
EOS: 6 %
HEMATOCRIT: 37.7 % (ref 34.0–46.6)
HEMOGLOBIN: 12.6 g/dL (ref 11.1–15.9)
IMMATURE GRANS (ABS): 0 10*3/uL (ref 0.0–0.1)
IMMATURE GRANULOCYTES: 0 %
LYMPHS: 36 %
Lymphocytes Absolute: 1.2 10*3/uL (ref 0.7–3.1)
MCH: 29.5 pg (ref 26.6–33.0)
MCHC: 33.4 g/dL (ref 31.5–35.7)
MCV: 88 fL (ref 79–97)
MONOCYTES: 10 %
Monocytes Absolute: 0.4 10*3/uL (ref 0.1–0.9)
NEUTROS PCT: 47 %
Neutrophils Absolute: 1.7 10*3/uL (ref 1.4–7.0)
Platelets: 215 10*3/uL (ref 150–379)
RBC: 4.27 x10E6/uL (ref 3.77–5.28)
RDW: 12.6 % (ref 12.3–15.4)
WBC: 3.5 10*3/uL (ref 3.4–10.8)

## 2015-12-11 LAB — CARBAMAZEPINE, FREE AND TOTAL
Carbamazepine, Free: 3.7 ug/mL (ref 0.6–4.2)
Carbamazepine, Total: 15.6 ug/mL (ref 4.0–12.0)

## 2015-12-11 LAB — HEMOGLOBIN A1C
ESTIMATED AVERAGE GLUCOSE: 108 mg/dL
Hgb A1c MFr Bld: 5.4 % (ref 4.8–5.6)

## 2015-12-11 NOTE — Telephone Encounter (Signed)
Patient's mother advised of results by Rachelle P.  Thanks,  -Joseline

## 2015-12-12 ENCOUNTER — Telehealth: Payer: Self-pay

## 2015-12-12 DIAGNOSIS — B379 Candidiasis, unspecified: Secondary | ICD-10-CM

## 2015-12-12 DIAGNOSIS — T3695XA Adverse effect of unspecified systemic antibiotic, initial encounter: Principal | ICD-10-CM

## 2015-12-12 MED ORDER — FLUCONAZOLE 150 MG PO TABS: 150.0000 mg | ORAL_TABLET | Freq: Once | ORAL | 0 refills | Status: AC

## 2015-12-12 NOTE — Telephone Encounter (Signed)
Have her check mouth and throat for any white rash to make sure it is not thrush.

## 2015-12-12 NOTE — Telephone Encounter (Signed)
Advised patient's mother of results. Patient's mom reports that the patient has now developed a sore throat. She reports that she is currently taking Augmentin but wonders if she needs something different. Patient does take allergy meds (Allegra, Flonase, and Singulair) on a daily basis. She reports that she has not been exposed to strep, and she does not have a fever. Any recommendations? Please advise. Thanks!

## 2015-12-12 NOTE — Telephone Encounter (Signed)
-----   Message from Margaretann LovelessJennifer M Burnette, [place]-C sent at 12/11/2015 10:04 AM EDT ----- Carbamazepine free is WNL and stable from previous check.

## 2015-12-12 NOTE — Telephone Encounter (Signed)
She reports that she did notice a white coating on her tongue now that she thinks about it. Can you send something in for that? Also, she thinks that patient may need a oral medication for a vaginal yeast infection to take. Patient uses [facility]. She sends her thanks.

## 2015-12-12 NOTE — Telephone Encounter (Signed)
Diflucan sent to [facility]

## 2015-12-13 NOTE — Telephone Encounter (Signed)
Advised mother as below.  

## 2015-12-13 NOTE — Telephone Encounter (Signed)
Left message to call back to let her know.

## 2015-12-15 ENCOUNTER — Encounter: Payer: Self-pay | Admitting: Family Medicine

## 2015-12-15 ENCOUNTER — Ambulatory Visit (INDEPENDENT_AMBULATORY_CARE_PROVIDER_SITE_OTHER): Payer: Medicare Other | Admitting: Family Medicine

## 2015-12-15 VITALS — BP 98/62 | HR 82 | Temp 98.8°F | Resp 16

## 2015-12-15 DIAGNOSIS — F840 Autistic disorder: Secondary | ICD-10-CM | POA: Diagnosis not present

## 2015-12-15 DIAGNOSIS — J019 Acute sinusitis, unspecified: Secondary | ICD-10-CM

## 2015-12-15 DIAGNOSIS — J302 Other seasonal allergic rhinitis: Secondary | ICD-10-CM

## 2015-12-15 NOTE — Progress Notes (Signed)
Patient: Joanna Robinson Female    DOB: [birth year]   35 y.o.   MRN: [id number] Visit Date: 12/15/2015  Today's Provider: Dortha Kernennis Chrismon, PA   Chief Complaint  Patient presents with  . Sore Throat   Subjective:    HPI  Patient's mother states that she saw Joanna Manjennifer Burnette, PA about 1 week ago and was started on Augmentin which she took last dose of today and eye drops. Patient seems to be doing better with eye pain but mom states all night patient complained of nose hurting/congested and sore throat. Patient did have a fever of 101 yesterday. She does have a cough also. Took Tylenol this morning and temperature better. Took last dose of Augmentin this morning, also.   Past Medical History:  Diagnosis Date  . Autism   . Epilepsy (HCC)   . Hemorrhoid   . Hyperlipidemia   . Seizures (HCC)    No past surgical history on file. Family History  Problem Relation Age of Onset  . Alzheimer's disease Maternal Grandmother     Died at 8577  . Alzheimer's disease Paternal Grandfather     Died at 8682   Allergies  Allergen Reactions  . Codeine   . Corticosteroids Nausea Only    Current Outpatient Prescriptions:  .  acetaZOLAMIDE (DIAMOX) 250 MG tablet, Take 1 tab by mouth twice daily, Disp: 60 tablet, Rfl: 5 .  ALLEGRA ALLERGY 180 MG tablet, TAKE 1 TABLET BY MOUTH ONCE DAILY., Disp: 30 tablet, Rfl: 11 .  Alpha-D-Galactosidase (BEANO) TABS, Take by mouth as needed (2-3 Tabs as needed prior to meal that may cause bloating.)., Disp: , Rfl:  .  Aluminum & Magnesium Hydroxide (MYLANTA ULTIMATE STRENGTH) 500-500 MG/5ML SUSP, MYLANTA ULTIMATE STRENGTH, 500-500MG /5ML (Oral Suspension)  1 TID as needed for 0 days  Quantity: 0.00;  Refills: 0   Ordered :14-Mar-2010  Denna HaggardAleksandrova, MA, Anastasiya ;  Started 19-October-2008 Active Comments: Medication taken as needed. , Disp: , Rfl:  .  benzoyl peroxide 5 % gel, Apply 5 % topically daily. Apply to face every morning., Disp: , Rfl:  .  benzoyl  peroxide-erythromycin (BENZAMYCIN) gel, Apply topically 2 (two) times daily., Disp: 23.3 g, Rfl: 0 .  Ca Carbonate-Mag Hydroxide (MYLANTA ULTRA) 700-300 MG CHEW, Chew by mouth 3 (three) times daily. Chew 1 by mouth three times daily as needed for abdominal pain., Disp: , Rfl:  .  calcium carbonate (TUMS E-X 750) 750 MG chewable tablet, Chew 2 tablets by mouth 4 (four) times daily. 2 by mouth four times daily for indigestion., Disp: , Rfl:  .  CARBATROL 200 MG 12 hr capsule, Give Joanna Robinson 2 capsules in the morning and 1 capsule in the evening, Disp: 90 capsule, Rfl: 5 .  CARBATROL 300 MG 12 hr capsule, Take 1 capsule at bedtime along with the Carbatrol 200mg  capsule, Disp: 30 capsule, Rfl: 5 .  cloNIDine (CATAPRES) 0.2 MG tablet, Take 0.2 mg by mouth at bedtime., Disp: , Rfl:  .  Dextromethorphan-Menthol (DELSYM COUGH RELIEF MT), Use as directed in the mouth or throat as needed (2 Tsp. by mouth every 12 hours as needed for cough.). Reported on 04/07/2015, Disp: , Rfl:  .  erythromycin ophthalmic ointment, 1 application 2 (two) times daily as needed (Apply to affected area twicw daily PRN)., Disp: , Rfl:  .  fluticasone (FLONASE) 50 MCG/ACT nasal spray, Place 2 sprays into both nostrils daily., Disp: 48 g, Rfl: 1 .  fluvoxaMINE (LUVOX) 50 MG  tablet, Take 50 mg by mouth 2 (two) times daily., Disp: , Rfl:  .  furosemide (LASIX) 20 MG tablet, Take 1 tablet by mouth daily., Disp: , Rfl:  .  GNP PAIN RELIEF EX-STRENGTH 500 MG tablet, TAKE 2 TABLETS BY MOUTH 3 TIMES DAILY ASNEEDED FOR HEADACHE, Disp: 30 tablet, Rfl: 5 .  hydrochlorothiazide (HYDRODIURIL) 25 MG tablet, TAKE 1 TABLET BY MOUTH ONCE DAILY., Disp: 90 tablet, Rfl: 1 .  hydrocortisone (ANUSOL-HC) 2.5 % rectal cream, Place 1 application rectally 2 (two) times daily., Disp: , Rfl:  .  hydrocortisone (ANUSOL-HC) 25 MG suppository, Place 25 mg rectally as needed for hemorrhoids., Disp: , Rfl:  .  montelukast (SINGULAIR) 10 MG tablet, TAKE 1 TABLET BY  MOUTH ONCE DAILY., Disp: 90 tablet, Rfl: 1 .  naproxen (NAPROSYN) 500 MG tablet, TAKE 1 TABLET BY MOUTH TWICE DAILY FOR ENTIRE MENSTRUAL CYCLE., Disp: 20 tablet, Rfl: 5 .  omega-3 acid ethyl esters (LOVAZA) 1 G capsule, Take 1 g by mouth daily., Disp: , Rfl:  .  omega-3 fish oil (MAXEPA) 1000 MG CAPS capsule, Take 1 capsule by mouth daily. 1 Cap every morning with food., Disp: , Rfl:  .  polyethylene glycol powder (GLYCOLAX/MIRALAX) powder, MIX 17 GM IN 8 OZ OF WATER/JUICE AND DRINK EACH NIGHT AT BEDTIME., Disp: 255 g, Rfl: 5 .  potassium chloride (KLOR-CON) 20 MEQ packet, DISSOLVE CONTENTS OF 1 PACKET IN AT LEAST 4 OZ OF WATER/JUICE AND DRINK ONCEDAILY, Disp: 30 packet, Rfl: 5 .  sodium chloride (OCEAN) 0.65 % nasal spray, Place 1 spray into the nose as needed for congestion., Disp: , Rfl:  .  sodium fluoride (DENTA 5000 PLUS) 1.1 % CREA dental cream, Place 1 application onto teeth every evening. Brush teeth daily for 1 minute, rinse and spit., Disp: 1 Tube, Rfl: 5 .  sulfacetamide (BLEPH-10) 10 % ophthalmic solution, Apply two drops to right eye 4 x day., Disp: 15 mL, Rfl: 0 .  Sunscreens (AVEENO CLEAR COMPLEXION BB) CREA, Apply as directed bid, Disp: 75 mL, Rfl: 11 .  SYSTANE ULTRA 0.4-0.3 % SOLN, PLACE 1 DROP INTO EACH EYE AT BEDTIME AND UP TO 4 TIMES DAILY AS NEEDED FOR REDNESS., Disp: 10 mL, Rfl: 5 .  TRIAMCINOLONE ACETONIDE, TOP, 0.05 % OINT, Apply topically 3 (three) times daily. Apply to rash as needed TID., Disp: , Rfl:  .  triamcinolone cream (KENALOG) 0.5 %, APPLY TOPICALLY TO RASH 3 TIMES DAILY ASNEEDED., Disp: 60 g, Rfl: 5 .  Witch Hazel (TUCKS) 50 % PADS, Apply 50 % topically as needed (As needed for cleansing and hemorrhoid discomfort.)., Disp: , Rfl:   Review of Systems  Constitutional: Positive for fever.  HENT: Positive for congestion and sore throat.   Respiratory: Positive for cough.   Cardiovascular: Negative.     Social History  Substance Use Topics  . Smoking status:  Never Smoker  . Smokeless tobacco: Never Used  . Alcohol use No   Objective:   BP 98/62   Pulse 82   Temp 98.8 F (37.1 C)   Resp 16   LMP 12/07/2015   Physical Exam  Constitutional: She appears well-developed and well-nourished. No distress.  HENT:  Head: Normocephalic and atraumatic.  Right Ear: Hearing and external ear normal.  Left Ear: Hearing and external ear normal.  Nose: Nose normal.  Mouth/Throat: Oropharynx is clear and moist.  Slightly bluish turbinates without erythema.  Eyes: Conjunctivae and lids are normal. Right eye exhibits no discharge. Left eye exhibits  no discharge. No scleral icterus.  Neck: Neck supple.  Cardiovascular: Normal rate and regular rhythm.   Pulmonary/Chest: Effort normal and breath sounds normal. No respiratory distress.  Musculoskeletal: Normal range of motion.  Lymphadenopathy:    She has no cervical adenopathy.  Neurological:  Autistic behavior.  Skin: Skin is intact. No lesion and no rash noted.  Psychiatric: Her speech is normal.  Loud short bursts of speech.     Assessment & Plan:     1. Subacute sinusitis, unspecified location Had temperature elevation yesterday and no return after taking Tylenol. Difficult to ascertain symptoms due to autism. No fever today and PE unremarkable. Finish the Augmentin and continue allergy treatments. Recheck if fever returns.   2. Other seasonal allergic rhinitis Some sneezing and rhinorrhea with irritation to eyes. Continue Allegra and Flonase. Will see ophthalmologist next week. No significant irritation or drainage from eyes today.  3. Active autistic disorder Stable - unchanged. Follow up with neurologist on 12-25-15 for follow up of epilepsy.     Dortha Kern, PA  [facility]

## 2015-12-21 ENCOUNTER — Encounter (INDEPENDENT_AMBULATORY_CARE_PROVIDER_SITE_OTHER): Payer: Self-pay | Admitting: Family

## 2015-12-24 ENCOUNTER — Ambulatory Visit (INDEPENDENT_AMBULATORY_CARE_PROVIDER_SITE_OTHER): Payer: Medicare Other | Admitting: Family

## 2015-12-26 DIAGNOSIS — H1045 Other chronic allergic conjunctivitis: Secondary | ICD-10-CM | POA: Diagnosis not present

## 2016-01-01 ENCOUNTER — Other Ambulatory Visit: Payer: Self-pay | Admitting: Family Medicine

## 2016-01-01 ENCOUNTER — Other Ambulatory Visit (INDEPENDENT_AMBULATORY_CARE_PROVIDER_SITE_OTHER): Payer: Self-pay

## 2016-01-01 DIAGNOSIS — G40209 Localization-related (focal) (partial) symptomatic epilepsy and epileptic syndromes with complex partial seizures, not intractable, without status epilepticus: Secondary | ICD-10-CM

## 2016-01-01 DIAGNOSIS — J302 Other seasonal allergic rhinitis: Secondary | ICD-10-CM

## 2016-01-01 MED ORDER — ACETAZOLAMIDE 250 MG PO TABS
ORAL_TABLET | ORAL | 5 refills | Status: DC
Start: 2016-01-01 — End: 2016-07-25

## 2016-01-01 MED ORDER — CARBATROL 200 MG PO CP12: ORAL_CAPSULE | ORAL | 5 refills | Status: DC

## 2016-01-01 MED ORDER — CARBATROL 300 MG PO CP12: ORAL_CAPSULE | ORAL | 5 refills | Status: DC

## 2016-01-07 ENCOUNTER — Encounter (INDEPENDENT_AMBULATORY_CARE_PROVIDER_SITE_OTHER): Payer: Self-pay | Admitting: Family

## 2016-01-08 ENCOUNTER — Ambulatory Visit (INDEPENDENT_AMBULATORY_CARE_PROVIDER_SITE_OTHER): Payer: Medicare Other | Admitting: Family

## 2016-01-14 ENCOUNTER — Ambulatory Visit (INDEPENDENT_AMBULATORY_CARE_PROVIDER_SITE_OTHER): Payer: Medicare Other | Admitting: Family

## 2016-01-15 ENCOUNTER — Ambulatory Visit (INDEPENDENT_AMBULATORY_CARE_PROVIDER_SITE_OTHER): Payer: Medicare Other | Admitting: Family

## 2016-01-15 ENCOUNTER — Encounter (INDEPENDENT_AMBULATORY_CARE_PROVIDER_SITE_OTHER): Payer: Self-pay | Admitting: Family

## 2016-01-15 VITALS — BP 120/74 | HR 80 | Wt 165.0 lb

## 2016-01-15 DIAGNOSIS — G40909 Epilepsy, unspecified, not intractable, without status epilepticus: Secondary | ICD-10-CM

## 2016-01-15 DIAGNOSIS — F840 Autistic disorder: Secondary | ICD-10-CM | POA: Diagnosis not present

## 2016-01-15 DIAGNOSIS — F79 Unspecified intellectual disabilities: Secondary | ICD-10-CM | POA: Diagnosis not present

## 2016-01-15 NOTE — Progress Notes (Signed)
Patient: Joanna Robinson MRN: [id number] Sex: female DOB: [birth year]  Provider: Elveria Risingina Meiah Zamudio, NP [facility]  Note type: Routine return visit  History of Present Illness: Referral Source: Joanna PhenixNancy Maloney, MD History [facility]: [facility] and [facility] Chief Complaint: Epilepsy, Problems with eyes  Joanna SaaDanielle L Robinson is a 35 y.o. woman with history of  undifferentiated autism, and history of complex partial seizures. She was last seen January 11, 2015. Joanna Robinson used to live in a group home, but has lived at home with her mother for over two years. Joanna Robinson goes to a day program two days per week and has a caregiver that comes in to help care for her on the other days. Mom says that overall things are going well with her at home. She has been seen recently by her PCP and her ophthalmologist for complaints of sore throat and eye pain. Mom said that labs were obtained and were normal. She said that Joanna Robinson was given a course of Augmentin. Joanna Robinson said "throat hurts" twice today in the exam room and Mom said that is what she has been saying intermittently for a couple of weeks.   [name]'s behavior is entirely self-directed and she cannot be hurried. Mom feels that Joanna Robinson is happier at home because her slow behavior is tolerated better than the group home. Mom uses a wheelchair to transport her and that has worked out well for Joanna Robinson and for the day program. She does not use it once at the day program or at home.   Mom reports that Joanna Robinson has been otherwise generally physically healthy. She has about 2 or 3 brief seizures per year, usually associated with her menstrual period.   [name]'s mother has no other health concerns for her today other than previously mentioned.  Review of Systems: Please see the HPI for neurologic and other pertinent review of systems. Otherwise, the following systems are noncontributory including constitutional, eyes, ears, nose and  throat, cardiovascular, respiratory, gastrointestinal, genitourinary, musculoskeletal, skin, endocrine, hematologic/lymph, allergic/immunologic and psychiatric.   Past Medical History:  Diagnosis Date  . Autism   . Epilepsy (HCC)   . Hemorrhoid   . Hyperlipidemia   . Seizures (HCC)    Hospitalizations: No., Head Injury: No., Nervous System Infections: No., Immunizations up to date: Yes.   Past Medical History Comments: see history  Surgical History History reviewed. No pertinent surgical history.  Family History family history includes Alzheimer's disease in her maternal grandmother and paternal grandfather. Family History is otherwise negative for migraines, seizures, cognitive impairment, blindness, deafness, birth defects, chromosomal disorder, autism.  Social History Social History   Social History  . Marital status: Single    Spouse name: N/A  . Number of children: N/A  . Years of education: N/A   Social History Main Topics  . Smoking status: Never Smoker  . Smokeless tobacco: Never Used  . Alcohol use No  . Drug use: No  . Sexual activity: No   Other Topics Concern  . 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 She has a nurse that comes to her home and they go out on field trips. She enjoys exercise, dance, and riding bike. She lives with her mother.    Allergies Allergies  Allergen Reactions  . Codeine   . Corticosteroids Nausea Only    Physical Exam BP 120/74   Pulse 80   Wt 165 lb (74.8 kg) Comment: reported  LMP 12/26/2015 (Within  Days)   BMI 25.09 kg/m  General: alert, well developed, well nourished female, in no acute distress. She intermittently tossed her head, flapped her arms and hands throughout the visit.  Head: normocephalic, no dysmorphic features,  Ears, Nose and Throat: Pharynx: oropharynx is pink without exudates or tonsillar hypertrophy.  Neck: supple, full range of motion, no cranial or cervical bruits   Respiratory: auscultation clear  Cardiovascular: no murmurs, pulses are normal  Musculoskeletal: no skeletal deformities or apparent scoliosis  Skin: no rashes or neurocutaneous lesions   Neurologic Exam  Mental Status: alert; oriented to person; knowledge is below normal for age; language is pragmatic and somewhat echolalic. She will repeat a phrase over over again until she has acknowledged. She was fairly cooperative for examination.  Cranial Nerves: visual fields appear full but examination was limited by patient's inability to fully cooperate; extraocular movements are full and conjugate; pupils are round reactive to light; funduscopic examination shows sharp disc margins with normal vessels; symmetric facial strength; midline tongue and uvula; hearing is functionally normal  Motor: Normal strength, tone, and mass; good fine motor movements  Sensory: withdrawal x 4  Coordination: had difficulty following instructions but coordination seems adequate.  Gait and Station: normal gait and station. She is slightly stiff in her posture, but no evidence of hemiparesis or circumduction. Balance is adequate Reflexes: symmetric and diminished bilaterally; no clonus, toes downgoing   Impression 1. Autism spectrum disorder 2. Complex partial seizures with secondary generalization   Recommendations for plan of care The patient's previous [facility] records were reviewed. Eller has neither had nor required imaging or lab studies since the last visit other than those collected by her PCP. Mom is aware of those results. She is a 35 year old young woman with undifferentiated autism, and history of complex partial seizures. She has occasional brief seizures usually associated with her menstrual period. Seryna has been otherwise health and is doing well. She will continue on her medication without change and will return for follow up in 1 year or sooner if needed. Mom agreed with this plan.  The  medication list was reviewed and reconciled.  No changes were made in the prescribed medications today.  A complete medication list was provided to the patient/caregiver.    Medication List       Accurate as of 01/15/16  2:00 PM. Always use your most recent med list.          acetaZOLAMIDE 250 MG tablet Commonly known as:  DIAMOX Take 1 tab by mouth twice daily   ALLEGRA ALLERGY 180 MG tablet Generic drug:  fexofenadine TAKE 1 TABLET BY MOUTH ONCE DAILY.   AVEENO CLEAR COMPLEXION BB Crea Apply as directed bid   BEANO Tabs Take by mouth as needed (2-3 Tabs as needed prior to meal that may cause bloating.).   benzoyl peroxide 5 % gel Apply 5 % topically daily. Apply to face every morning.   CARBATROL 200 MG 12 hr capsule Generic drug:  carbamazepine Give Joanna Robinson 2 capsules in the morning and 1 capsule in the evening   CARBATROL 300 MG 12 hr capsule Generic drug:  carbamazepine Take 1 capsule at bedtime along with the Carbatrol 200mg  capsule   cloNIDine 0.2 MG tablet Commonly known as:  CATAPRES Take 0.2 mg by mouth at bedtime.   DELSYM COUGH RELIEF MT Use as directed in the mouth or throat as needed (2 Tsp. by mouth every 12 hours as needed for cough.). Reported on 04/07/2015  erythromycin ophthalmic ointment 1 application 2 (two) times daily as needed (Apply to affected area twicw daily PRN).   fluticasone 50 MCG/ACT nasal spray Commonly known as:  FLONASE Place 2 sprays into both nostrils daily.   fluvoxaMINE 50 MG tablet Commonly known as:  LUVOX Take 50 mg by mouth 2 (two) times daily.   GNP PAIN RELIEF EX-STRENGTH 500 MG tablet Generic drug:  acetaminophen TAKE 2 TABLETS BY MOUTH 3 TIMES DAILY ASNEEDED FOR HEADACHE   hydrochlorothiazide 25 MG tablet Commonly known as:  HYDRODIURIL TAKE 1 TABLET BY MOUTH ONCE DAILY.   hydrocortisone 2.5 % rectal cream Commonly known as:  ANUSOL-HC Place 1 application rectally 2 (two) times daily.   LASIX 20 MG  tablet Generic drug:  furosemide Take 1 tablet by mouth daily.   montelukast 10 MG tablet Commonly known as:  SINGULAIR TAKE 1 TABLET BY MOUTH ONCE DAILY.   MYLANTA ULTIMATE STRENGTH 500-500 MG/5ML Susp Generic drug:  Aluminum & Magnesium Hydroxide MYLANTA ULTIMATE STRENGTH, 500-500MG /5ML (Oral Suspension)  1 TID as needed for 0 days  Quantity: 0.00;  Refills: 0   Ordered :14-Mar-2010  Denna Haggard, MA, Anastasiya ;  Started 19-October-2008 Active Comments: Medication taken as needed.   naproxen 500 MG tablet Commonly known as:  NAPROSYN TAKE 1 TABLET BY MOUTH TWICE DAILY FOR ENTIRE MENSTRUAL CYCLE.   omega-3 acid ethyl esters 1 g capsule Commonly known as:  LOVAZA Take 1 g by mouth daily.   potassium chloride 20 MEQ packet Commonly known as:  KLOR-CON DISSOLVE CONTENTS OF 1 PACKET IN AT LEAST 4 OZ OF WATER/JUICE AND DRINK ONCEDAILY   sodium fluoride 1.1 % Crea dental cream Commonly known as:  DENTA 5000 PLUS Place 1 application onto teeth every evening. Brush teeth daily for 1 minute, rinse and spit.   sulfacetamide 10 % ophthalmic solution Commonly known as:  BLEPH-10 Apply two drops to right eye 4 x day.   SYSTANE ULTRA 0.4-0.3 % Soln Generic drug:  Polyethyl Glycol-Propyl Glycol PLACE 1 DROP INTO EACH EYE AT BEDTIME AND UP TO 4 TIMES DAILY AS NEEDED FOR REDNESS.   TRIAMCINOLONE ACETONIDE (TOP) 0.05 % Oint Apply topically 3 (three) times daily. Apply to rash as needed TID.   TUCKS 50 % Pads Apply 50 % topically as needed (As needed for cleansing and hemorrhoid discomfort.).       Dr. Sharene Skeans was consulted regarding the patient.   Total time spent with the patient was 30 minutes, of which 50% or more was spent in counseling and coordination of care.   Joanna Rising NP-C

## 2016-01-16 NOTE — Patient Instructions (Signed)
Continue Zina's medications as you have been giving them. Let me know if her seizures become more frequent or change in any way.   Please plan to return for follow up in 1 year or sooner if needed.

## 2016-01-29 ENCOUNTER — Other Ambulatory Visit: Payer: Self-pay | Admitting: Family Medicine

## 2016-01-30 ENCOUNTER — Other Ambulatory Visit: Payer: Self-pay

## 2016-01-30 DIAGNOSIS — L709 Acne, unspecified: Secondary | ICD-10-CM

## 2016-01-30 DIAGNOSIS — F339 Major depressive disorder, recurrent, unspecified: Secondary | ICD-10-CM

## 2016-01-30 DIAGNOSIS — J302 Other seasonal allergic rhinitis: Secondary | ICD-10-CM

## 2016-01-30 MED ORDER — AVEENO CLEAR COMPLEXION BB EX CREA: TOPICAL_CREAM | CUTANEOUS | 11 refills | Status: DC

## 2016-01-30 MED ORDER — FLUTICASONE PROPIONATE 50 MCG/ACT NA SUSP: 2.0000 | Freq: Every day | NASAL | 1 refills | Status: DC

## 2016-01-30 MED ORDER — FLUVOXAMINE MALEATE 50 MG PO TABS: 50.0000 mg | ORAL_TABLET | Freq: Two times a day (BID) | ORAL | 3 refills | Status: DC

## 2016-01-30 MED ORDER — FEXOFENADINE HCL 180 MG PO TABS: 180.0000 mg | ORAL_TABLET | Freq: Every day | ORAL | 3 refills | Status: DC

## 2016-01-30 MED ORDER — TRIAMCINOLONE ACETONIDE 0.05 % EX OINT: TOPICAL_OINTMENT | CUTANEOUS | 5 refills | Status: DC

## 2016-01-30 NOTE — Telephone Encounter (Signed)
Patient's mother is asking if this could be refilled today if possible. She reports that the pharmacist told her that since the following meds were still in Dr. Santiago BurMaloney's name and they will not refill it.

## 2016-01-31 ENCOUNTER — Telehealth (INDEPENDENT_AMBULATORY_CARE_PROVIDER_SITE_OTHER): Payer: Self-pay

## 2016-01-31 ENCOUNTER — Telehealth: Payer: Self-pay | Admitting: Physician Assistant

## 2016-01-31 ENCOUNTER — Other Ambulatory Visit: Payer: Self-pay | Admitting: Family Medicine

## 2016-01-31 DIAGNOSIS — H10401 Unspecified chronic conjunctivitis, right eye: Secondary | ICD-10-CM

## 2016-01-31 DIAGNOSIS — R519 Headache, unspecified: Secondary | ICD-10-CM

## 2016-01-31 DIAGNOSIS — L700 Acne vulgaris: Secondary | ICD-10-CM

## 2016-01-31 DIAGNOSIS — G40209 Localization-related (focal) (partial) symptomatic epilepsy and epileptic syndromes with complex partial seizures, not intractable, without status epilepticus: Secondary | ICD-10-CM

## 2016-01-31 DIAGNOSIS — G479 Sleep disorder, unspecified: Secondary | ICD-10-CM

## 2016-01-31 DIAGNOSIS — R51 Headache: Principal | ICD-10-CM

## 2016-01-31 NOTE — Telephone Encounter (Signed)
Patient's mother called stating that she needs Joanna Robinson to call [facility] so that the patient can get her refills. She states that they informed her that the rx they have, has expired. She is requesting a call.   CB:[phone]

## 2016-01-31 NOTE — Telephone Encounter (Signed)
Please let Mom know that I called [facility]. They have the prescriptions that I sent in to them in October. There are no problems with them. They may be working on prescriptions from other offices. Thanks, Inetta Fermoina

## 2016-01-31 NOTE — Telephone Encounter (Signed)
Pt's mom contacted office for refill request on the following medications: 1. cloNIDine (CATAPRES) 0.1 MG tablet 2. sulfacetamide (BLEPH-10) 10 % ophthalmic solution  3. Erythromycin-Benzoyl 3.5 % Pt's mom stated that she had spoke with Antony ContrasJenni and Antony ContrasJenni advised her when pt needs refills on medications even if she isn't the original provider on the medications she would refill them. Mom is [facility]. Please advise. Thanks TNP

## 2016-01-31 NOTE — Telephone Encounter (Signed)
Informed mom that the [facility] has the patient's prescriptions

## 2016-02-01 MED ORDER — BENZOYL PEROXIDE-ERYTHROMYCIN 5-3 % EX GEL: Freq: Two times a day (BID) | CUTANEOUS | 5 refills | Status: DC

## 2016-02-01 MED ORDER — CLONIDINE HCL 0.2 MG PO TABS: 0.2000 mg | ORAL_TABLET | Freq: Every day | ORAL | 1 refills | Status: DC

## 2016-02-01 MED ORDER — SULFACETAMIDE SODIUM 10 % OP SOLN: OPHTHALMIC | 5 refills | Status: DC

## 2016-02-01 NOTE — Telephone Encounter (Signed)
Meds refilled.

## 2016-02-01 NOTE — Telephone Encounter (Signed)
Pt mom called requesting these Rx sent today by 2:00 so they can be delivered today.  LK#[phone]/GUCB#[phone]/MW

## 2016-02-01 NOTE — Telephone Encounter (Signed)
Please review-aa 

## 2016-02-05 ENCOUNTER — Other Ambulatory Visit: Payer: Self-pay | Admitting: Physician Assistant

## 2016-02-05 MED ORDER — AMOXICILLIN 500 MG PO CAPS: 500.0000 mg | ORAL_CAPSULE | Freq: Three times a day (TID) | ORAL | 0 refills | Status: DC

## 2016-02-05 NOTE — Telephone Encounter (Signed)
Will start antibiotic.

## 2016-02-05 NOTE — Telephone Encounter (Signed)
Please Review.  Victorino DikeJennifer is not here today she will be here tomorrow.  Thanks,  -Madison Direnzo

## 2016-02-05 NOTE — Telephone Encounter (Signed)
Will start antibiotic (Amoxicillin 500 mg TID #21), restart or continue nasal spray (Fluticasone nasal spray 2 sprays each nostril at bedtime - ordered on 01-30-16 by Victorino DikeJennifer), antihistamine (Allegra 180 mg qd - ordered 01-30-16 by Victorino DikeJennifer) and may use with Singulair (montelukast as ordered on 01-01-16). Schedule follow up appointment in the next 5-6 days to be sure this clears.

## 2016-02-05 NOTE — Telephone Encounter (Signed)
LMTCB  Thanks,  -Joanna Robinson 

## 2016-02-05 NOTE — Telephone Encounter (Signed)
Mom states pt is still having allergies, headache, sinus congestion, sore throat and watery eyes.  Mom is requesting another Rx to help with this.  [facility], to have delivered.  CB#[phone] or [phone]/MW

## 2016-02-06 NOTE — Telephone Encounter (Signed)
Mother has been advised. KW 

## 2016-02-11 ENCOUNTER — Telehealth: Payer: Self-pay | Admitting: Physician Assistant

## 2016-02-11 MED ORDER — FLUVOXAMINE MALEATE 100 MG PO TABS: 100.0000 mg | ORAL_TABLET | Freq: Two times a day (BID) | ORAL | 1 refills | Status: DC

## 2016-02-11 NOTE — Telephone Encounter (Signed)
 100mg  sent in. I thought Joseline had called [facility] to change this Rx last week. It has been updated.

## 2016-02-11 NOTE — Telephone Encounter (Signed)
Left message for mother advising her of update. KW

## 2016-02-11 NOTE — Telephone Encounter (Signed)
Mother states that patient is suppose to be 100mg  in AM and 100mg  in PM, prescription reads 50mg  BID. Mother states that she had a problem with dosage last month. Mother states that Dr. Jake Sharkhaney was the one who orginally wrote prescription as 100 in Am and 100 in PM she states that Dr. Elease HashimotoMaloney filled it for patient in past. Mother wants to know since you filled it as 50mg  BID do you want patient to stay on decreased dosage? Please review chart and advise. KW

## 2016-02-11 NOTE — Telephone Encounter (Signed)
Pt's mom called saying there is a mix up on her medication at the pharmacy.  fluvoxaMINE (LUVOX) 50 MG tabletfluvoxaMINE (LUVOX) 50 MG tablet  Pt is supposed to be taking 100mg  a day.  She is out now because the 50mg  was called in.  Please advise. Mom's # [phone]  Pharmacy # [facility].  Thanks Fortune Brandsteri

## 2016-02-18 ENCOUNTER — Encounter: Payer: Self-pay | Admitting: Physician Assistant

## 2016-02-18 ENCOUNTER — Ambulatory Visit (INDEPENDENT_AMBULATORY_CARE_PROVIDER_SITE_OTHER): Payer: Medicare Other | Admitting: Physician Assistant

## 2016-02-18 VITALS — BP 112/60 | HR 80 | Temp 98.9°F | Resp 16

## 2016-02-18 DIAGNOSIS — F840 Autistic disorder: Secondary | ICD-10-CM

## 2016-02-18 DIAGNOSIS — J0100 Acute maxillary sinusitis, unspecified: Secondary | ICD-10-CM

## 2016-02-18 MED ORDER — DOXYCYCLINE HYCLATE 100 MG PO TABS: 100.0000 mg | ORAL_TABLET | Freq: Two times a day (BID) | ORAL | 0 refills | Status: DC

## 2016-02-18 NOTE — Patient Instructions (Signed)

## 2016-02-18 NOTE — Progress Notes (Signed)
Patient: Joanna Robinson Female    DOB: 11-18-1980   35 y.o.   MRN: [id number] Visit Date: 02/18/2016  Today's Provider: Margaretann Loveless, PA-C   Chief Complaint  Patient presents with  . Sore Throat   Subjective:    HPI Patient is here today with her mother with c/o sore throat, right eye discharge. She is almost done with the amoxil, mucinex and Naprosyn for the pain with no relief.  She has previously been seen by myself on September 15 and then by [facility] on September 23. She was given Augmentin and amoxicillin respectively. She is also seen her ophthalmologist for the right eye pain that she complains about. The ophthalmologist states that she does have astigmatism of the right eye which may be what she is complaining about. She also continues to complain about her throat hurting and her nose. She has had issues with her sinuses this whole year since May. She does take Singulair, Allegra and Flonase daily. When she does the Flonase she does not inhale at the time due to not comprehending secondary to her autism but the mother does supported in her nose daily.  As just mentioned to the patient is autistic. She does respond and cooperate but is slow to respond and move into position for certain and exam portions.    Allergies  Allergen Reactions  . Codeine   . Corticosteroids Nausea Only     Current Outpatient Prescriptions:  .  acetaZOLAMIDE (DIAMOX) 250 MG tablet, Take 1 tab by mouth twice daily, Disp: 60 tablet, Rfl: 5 .  Alpha-D-Galactosidase (BEANO) TABS, Take by mouth as needed (2-3 Tabs as needed prior to meal that may cause bloating.)., Disp: , Rfl:  .  Aluminum & Magnesium Hydroxide (MYLANTA ULTIMATE STRENGTH) 500-500 MG/5ML SUSP, MYLANTA ULTIMATE STRENGTH, 500-500MG /5ML (Oral Suspension)  1 TID as needed for 0 days  Quantity: 0.00;  Refills: 0   Ordered :14-Mar-2010  Denna Haggard, MA, Anastasiya ;  Started 19-October-2008 Active Comments: Medication taken as  needed. , Disp: , Rfl:  .  amoxicillin (AMOXIL) 500 MG capsule, Take 1 capsule (500 mg total) by mouth 3 (three) times daily., Disp: 30 capsule, Rfl: 0 .  benzoyl peroxide 5 % gel, Apply 5 % topically daily. Apply to face every morning., Disp: , Rfl:  .  benzoyl peroxide-erythromycin (BENZAMYCIN) gel, Apply topically 2 (two) times daily., Disp: 23.3 g, Rfl: 5 .  CARBATROL 200 MG 12 hr capsule, Give [name] 2 capsules in the morning and 1 capsule in the evening, Disp: 90 capsule, Rfl: 5 .  CARBATROL 300 MG 12 hr capsule, Take 1 capsule at bedtime along with the Carbatrol 200mg  capsule, Disp: 30 capsule, Rfl: 5 .  cloNIDine (CATAPRES) 0.2 MG tablet, Take 1 tablet (0.2 mg total) by mouth at bedtime., Disp: 90 tablet, Rfl: 1 .  Dextromethorphan-Menthol (DELSYM COUGH RELIEF MT), Use as directed in the mouth or throat as needed (2 Tsp. by mouth every 12 hours as needed for cough.). Reported on 04/07/2015, Disp: , Rfl:  .  fexofenadine (ALLEGRA ALLERGY) 180 MG tablet, Take 1 tablet (180 mg total) by mouth daily., Disp: 90 tablet, Rfl: 3 .  fluticasone (FLONASE) 50 MCG/ACT nasal spray, Place 2 sprays into both nostrils daily., Disp: 48 g, Rfl: 1 .  fluvoxaMINE (LUVOX) 100 MG tablet, Take 1 tablet (100 mg total) by mouth 2 (two) times daily., Disp: 180 tablet, Rfl: 1 .  furosemide (LASIX) 20 MG tablet, Take 1 tablet  by mouth daily., Disp: , Rfl:  .  GNP PAIN RELIEF EX-STRENGTH 500 MG tablet, TAKE 2 TABLETS BY MOUTH 3 TIMES DAILY ASNEEDED FOR HEADACHE, Disp: 60 tablet, Rfl: 5 .  hydrochlorothiazide (HYDRODIURIL) 25 MG tablet, TAKE 1 TABLET BY MOUTH ONCE DAILY., Disp: 90 tablet, Rfl: 1 .  hydrocortisone (ANUSOL-HC) 2.5 % rectal cream, Place 1 application rectally 2 (two) times daily., Disp: , Rfl:  .  montelukast (SINGULAIR) 10 MG tablet, TAKE 1 TABLET BY MOUTH ONCE DAILY., Disp: 90 tablet, Rfl: 1 .  naproxen (NAPROSYN) 500 MG tablet, TAKE 1 TABLET BY MOUTH TWICE DAILY FOR ENTIRE MENSTRUAL CYCLE., Disp: 20  tablet, Rfl: 5 .  potassium chloride (KLOR-CON) 20 MEQ packet, DISSOLVE CONTENTS OF 1 PACK IN AT LEAST 4 OZ OF WATER/JUICE ONCE DAILY, Disp: 30 packet, Rfl: 5 .  sodium fluoride (DENTA 5000 PLUS) 1.1 % CREA dental cream, Place 1 application onto teeth every evening. Brush teeth daily for 1 minute, rinse and spit., Disp: 1 Tube, Rfl: 5 .  sulfacetamide (BLEPH-10) 10 % ophthalmic solution, Apply two drops to right eye 4 x day., Disp: 15 mL, Rfl: 5 .  Sunscreens (AVEENO CLEAR COMPLEXION BB) CREA, Apply as directed bid, Disp: 75 mL, Rfl: 11 .  TRIAMCINOLONE ACETONIDE, TOP, 0.05 % OINT, Apply to rash as needed TID., Disp: 85 g, Rfl: 5 .  Witch Hazel (TUCKS) 50 % PADS, Apply 50 % topically as needed (As needed for cleansing and hemorrhoid discomfort.)., Disp: , Rfl:  .  erythromycin ophthalmic ointment, 1 application 2 (two) times daily as needed (Apply to affected area twicw daily PRN)., Disp: , Rfl:  .  omega-3 acid ethyl esters (LOVAZA) 1 G capsule, Take 1 g by mouth daily., Disp: , Rfl:  .  SYSTANE ULTRA 0.4-0.3 % SOLN, PLACE 1 DROP INTO EACH EYE AT BEDTIME AND UP TO 4 TIMES DAILY AS NEEDED FOR REDNESS. (Patient not taking: Reported on 02/18/2016), Disp: 10 mL, Rfl: 5  Review of Systems  HENT: Positive for congestion, sinus pain and sore throat. Negative for ear pain, sneezing and trouble swallowing.   Eyes: Positive for discharge.  Respiratory: Negative for cough, chest tightness and shortness of breath.   Cardiovascular: Negative for chest pain, palpitations and leg swelling.  Gastrointestinal: Negative for abdominal pain.  Neurological: Positive for headaches.    Social History  Substance Use Topics  . Smoking status: Never Smoker  . Smokeless tobacco: Never Used  . Alcohol use No   Objective:   BP 112/60 (BP Location: Left Arm, Patient Position: Sitting, Cuff Size: Normal)   Pulse 80   Temp 98.9 F (37.2 C) (Oral)   Resp 16   Physical Exam  Constitutional: She appears  well-developed and well-nourished. No distress.  HENT:  Head: Normocephalic and atraumatic.  Right Ear: Hearing, tympanic membrane, external ear and ear canal normal.  Left Ear: Hearing, tympanic membrane, external ear and ear canal normal.  Nose: Mucosal edema (nasal turbinates are swollen and erythematous) present. No rhinorrhea. Right sinus exhibits maxillary sinus tenderness. Right sinus exhibits no frontal sinus tenderness. Left sinus exhibits maxillary sinus tenderness. Left sinus exhibits no frontal sinus tenderness.  Mouth/Throat: Uvula is midline, oropharynx is clear and moist and mucous membranes are normal. No oropharyngeal exudate, posterior oropharyngeal edema or posterior oropharyngeal erythema.  Neck: Normal range of motion. Neck supple. No tracheal deviation present. No thyromegaly present.  Cardiovascular: Normal rate, regular rhythm and normal heart sounds.  Exam reveals no gallop and no friction rub.  No murmur heard. Pulmonary/Chest: Effort normal and breath sounds normal. No stridor. No respiratory distress. She has no wheezes. She has no rales.  Lymphadenopathy:    She has no cervical adenopathy.  Skin: She is not diaphoretic.  Vitals reviewed.     Assessment & Plan:     1. Subacute maxillary sinusitis Subacute maxillary sinusitis that has been ongoing since September without much relief. Original onset was in May but she has had relief since then. She is not quite to chronic sinusitis diagnosis due to the timeline but is getting more into a chronic sinusitis instead. I will get doxycycline as below being that she has failed amoxicillin and Augmentin. I will also refer to ENT for further evaluation to make sure there is not another option for her with her autism and not doing well with inhaled nasal corticosteroids. Her mother has asked for me to refer her to Dr. Andee PolesVaught as he has done surgery on one of her other children. Referral was placed as below. Appreciate  consultation. Continue seasonal allergy medications as prescribed. She is to call the office if symptoms worsen before referral. - doxycycline (VIBRA-TABS) 100 MG tablet; Take 1 tablet (100 mg total) by mouth 2 (two) times daily.  Dispense: 20 tablet; Refill: 0 - Ambulatory referral to ENT  2. Active autistic disorder Pulled diagnosis of of autism so that ENT would be aware of this diagnosis. Patient does respond and will quite great with physical exam but is slow to do so. - Ambulatory referral to ENT       Joanna LovelessJennifer M Burnette, PA-C  [facility]

## 2016-02-27 ENCOUNTER — Other Ambulatory Visit: Payer: Self-pay | Admitting: Family Medicine

## 2016-02-27 NOTE — Telephone Encounter (Signed)
Please review-aa 

## 2016-02-28 DIAGNOSIS — J039 Acute tonsillitis, unspecified: Secondary | ICD-10-CM | POA: Diagnosis not present

## 2016-02-28 DIAGNOSIS — R51 Headache: Secondary | ICD-10-CM | POA: Diagnosis not present

## 2016-02-28 DIAGNOSIS — J301 Allergic rhinitis due to pollen: Secondary | ICD-10-CM | POA: Diagnosis not present

## 2016-02-28 DIAGNOSIS — R070 Pain in throat: Secondary | ICD-10-CM | POA: Diagnosis not present

## 2016-03-07 ENCOUNTER — Ambulatory Visit: Payer: Medicare Other | Admitting: Physician Assistant

## 2016-03-28 ENCOUNTER — Other Ambulatory Visit: Payer: Self-pay

## 2016-03-28 DIAGNOSIS — J302 Other seasonal allergic rhinitis: Secondary | ICD-10-CM

## 2016-03-28 MED ORDER — MONTELUKAST SODIUM 10 MG PO TABS: 10.0000 mg | ORAL_TABLET | Freq: Every day | ORAL | 3 refills | Status: DC

## 2016-03-28 NOTE — Telephone Encounter (Signed)
Pharmacy requesting refill Last ov 02/18/16  Last filled 01/01/16. Please review. Thank you. sd

## 2016-05-05 ENCOUNTER — Telehealth: Payer: Self-pay

## 2016-05-05 DIAGNOSIS — K5900 Constipation, unspecified: Secondary | ICD-10-CM

## 2016-05-05 MED ORDER — POLYETHYLENE GLYCOL 3350 17 GM/SCOOP PO POWD: 17.0000 g | Freq: Every day | ORAL | 3 refills | Status: DC

## 2016-05-05 NOTE — Telephone Encounter (Signed)
Medication Refill: Polyethylene glycol Pow GM #225 R:5 Sig: Mix 17 Grams in 8 oz of water/Juice and drink each night at bedtime  Pharmacy; [facility]

## 2016-05-05 NOTE — Telephone Encounter (Signed)
Miralax refilled and sent to [facility]

## 2016-06-20 ENCOUNTER — Ambulatory Visit (INDEPENDENT_AMBULATORY_CARE_PROVIDER_SITE_OTHER): Payer: Medicare Other | Admitting: Physician Assistant

## 2016-06-20 ENCOUNTER — Encounter: Payer: Self-pay | Admitting: Physician Assistant

## 2016-06-20 VITALS — BP 96/60 | HR 78 | Temp 99.1°F | Resp 16 | Wt 160.0 lb

## 2016-06-20 DIAGNOSIS — E876 Hypokalemia: Secondary | ICD-10-CM

## 2016-06-20 DIAGNOSIS — R799 Abnormal finding of blood chemistry, unspecified: Secondary | ICD-10-CM | POA: Diagnosis not present

## 2016-06-20 DIAGNOSIS — G40909 Epilepsy, unspecified, not intractable, without status epilepticus: Secondary | ICD-10-CM

## 2016-06-20 DIAGNOSIS — E7800 Pure hypercholesterolemia, unspecified: Secondary | ICD-10-CM

## 2016-06-20 DIAGNOSIS — D709 Neutropenia, unspecified: Secondary | ICD-10-CM

## 2016-06-20 DIAGNOSIS — J0100 Acute maxillary sinusitis, unspecified: Secondary | ICD-10-CM | POA: Diagnosis not present

## 2016-06-20 DIAGNOSIS — Z79899 Other long term (current) drug therapy: Secondary | ICD-10-CM

## 2016-06-20 DIAGNOSIS — F840 Autistic disorder: Secondary | ICD-10-CM

## 2016-06-20 DIAGNOSIS — R7309 Other abnormal glucose: Secondary | ICD-10-CM

## 2016-06-20 MED ORDER — DOXYCYCLINE HYCLATE 100 MG PO TABS: 100.0000 mg | ORAL_TABLET | Freq: Two times a day (BID) | ORAL | 0 refills | Status: DC

## 2016-06-20 NOTE — Patient Instructions (Signed)

## 2016-06-20 NOTE — Progress Notes (Signed)
Patient: Joanna Robinson Female    DOB: 01-05-1981   36 y.o.   MRN: [id number] Visit Date: 06/20/2016  Today's Provider: Margaretann Loveless, PA-C   Chief Complaint  Patient presents with  . Sore Throat   Subjective:    HPI Patient here today with her mother Mrs. Johnson, C/O sore throat, cough, and runny nose. Mrs. Laural Benes reports that symptoms started last night.     Allergies  Allergen Reactions  . Codeine   . Corticosteroids Nausea Only     Current Outpatient Prescriptions:  .  acetaZOLAMIDE (DIAMOX) 250 MG tablet, Take 1 tab by mouth twice daily, Disp: 60 tablet, Rfl: 5 .  Alpha-D-Galactosidase (BEANO) TABS, Take by mouth as needed (2-3 Tabs as needed prior to meal that may cause bloating.)., Disp: , Rfl:  .  Aluminum & Magnesium Hydroxide (MYLANTA ULTIMATE STRENGTH) 500-500 MG/5ML SUSP, MYLANTA ULTIMATE STRENGTH, 500-500MG /5ML (Oral Suspension)  1 TID as needed for 0 days  Quantity: 0.00;  Refills: 0   Ordered :14-Mar-2010  Denna Haggard, MA, Anastasiya ;  Started 19-October-2008 Active Comments: Medication taken as needed. , Disp: , Rfl:  .  benzoyl peroxide 5 % gel, Apply 5 % topically daily. Apply to face every morning., Disp: , Rfl:  .  benzoyl peroxide-erythromycin (BENZAMYCIN) gel, Apply topically 2 (two) times daily., Disp: 23.3 g, Rfl: 5 .  CARBATROL 200 MG 12 hr capsule, Give Louanna 2 capsules in the morning and 1 capsule in the evening, Disp: 90 capsule, Rfl: 5 .  CARBATROL 300 MG 12 hr capsule, Take 1 capsule at bedtime along with the Carbatrol  capsule, Disp: 30 capsule, Rfl: 5 .  cloNIDine (CATAPRES) 0.2 MG tablet, Take 1 tablet (0.2 mg total) by mouth at bedtime., Disp: 90 tablet, Rfl: 1 .  Dextromethorphan-Menthol (DELSYM COUGH RELIEF MT), Use as directed in the mouth or throat as needed (2 Tsp. by mouth every 12 hours as needed for cough.). Reported on 04/07/2015, Disp: , Rfl:  .  doxycycline (VIBRA-TABS) 100 MG tablet, Take 1 tablet (100 mg total)  by mouth 2 (two) times daily., Disp: 20 tablet, Rfl: 0 .  erythromycin ophthalmic ointment, 1 application 2 (two) times daily as needed (Apply to affected area twicw daily PRN)., Disp: , Rfl:  .  fexofenadine (ALLEGRA ALLERGY) 180 MG tablet, Take 1 tablet (180 mg total) by mouth daily., Disp: 90 tablet, Rfl: 3 .  fluticasone (FLONASE) 50 MCG/ACT nasal spray, Place 2 sprays into both nostrils daily., Disp: 48 g, Rfl: 1 .  fluvoxaMINE (LUVOX) 100 MG tablet, Take 1 tablet (100 mg total) by mouth 2 (two) times daily., Disp: 180 tablet, Rfl: 1 .  furosemide (LASIX) 20 MG tablet, Take 1 tablet by mouth daily., Disp: , Rfl:  .  GNP PAIN RELIEF EX-STRENGTH 500 MG tablet, TAKE 2 TABLETS BY MOUTH 3 TIMES DAILY ASNEEDED FOR HEADACHE, Disp: 60 tablet, Rfl: 5 .  hydrochlorothiazide (HYDRODIURIL) 25 MG tablet, TAKE 1 TABLET BY MOUTH ONCE DAILY., Disp: 90 tablet, Rfl: 1 .  hydrocortisone (ANUSOL-HC) 2.5 % rectal cream, Place 1 application rectally 2 (two) times daily., Disp: , Rfl:  .  montelukast (SINGULAIR) 10 MG tablet, Take 1 tablet (10 mg total) by mouth daily., Disp: 90 tablet, Rfl: 3 .  naproxen (NAPROSYN) 500 MG tablet, TAKE 1 TABLET BY MOUTH TWICE DAILY FOR ENTIRE MENSTRUAL CYCLE., Disp: 20 tablet, Rfl: 5 .  omega-3 acid ethyl esters (LOVAZA) 1 G capsule, Take 1 g by mouth daily.,  Disp: , Rfl:  .  polyethylene glycol powder (GLYCOLAX/MIRALAX) powder, Take 17 g by mouth at bedtime., Disp: 3350 g, Rfl: 3 .  potassium chloride (KLOR-CON) 20 MEQ packet, DISSOLVE CONTENTS OF 1 PACKET IN AT LEAST 4 OZ OF WATER/JUICE AND DRINK ONCEDAILY, Disp: 30 packet, Rfl: 11 .  sodium fluoride (DENTA 5000 PLUS) 1.1 % CREA dental cream, Place 1 application onto teeth every evening. Brush teeth daily for 1 minute, rinse and spit., Disp: 1 Tube, Rfl: 5 .  sulfacetamide (BLEPH-10) 10 % ophthalmic solution, Apply two drops to right eye 4 x day., Disp: 15 mL, Rfl: 5 .  Sunscreens (AVEENO CLEAR COMPLEXION BB) CREA, Apply as directed  bid, Disp: 75 mL, Rfl: 11 .  SYSTANE ULTRA 0.4-0.3 % SOLN, PLACE 1 DROP INTO EACH EYE AT BEDTIME AND UP TO 4 TIMES DAILY AS NEEDED FOR REDNESS. (Patient not taking: Reported on 02/18/2016), Disp: 10 mL, Rfl: 5 .  TRIAMCINOLONE ACETONIDE, TOP, 0.05 % OINT, Apply to rash as needed TID., Disp: 85 g, Rfl: 5 .  Witch Hazel (TUCKS) 50 % PADS, Apply 50 % topically as needed (As needed for cleansing and hemorrhoid discomfort.)., Disp: , Rfl:   Review of Systems  Constitutional: Positive for fever (low grade).  HENT: Positive for congestion, postnasal drip, rhinorrhea, sinus pressure and sore throat. Negative for ear pain, sinus pain, sneezing and trouble swallowing.   Respiratory: Positive for cough. Negative for chest tightness, shortness of breath and wheezing.   Cardiovascular: Negative for chest pain.  Gastrointestinal: Negative for nausea and vomiting.  Neurological: Negative for dizziness and headaches.    Social History  Substance Use Topics  . Smoking status: Never Smoker  . Smokeless tobacco: Never Used  . Alcohol use No   Objective:   BP 96/60 (BP Location: Left Arm, Patient Position: Sitting, Cuff Size: Large)   Pulse 78   Temp 99.1 F (37.3 C) (Oral)   Resp 16   Wt 160 lb (72.6 kg)   SpO2 99%   BMI 24.33 kg/m  Vitals:   06/20/16 1435  BP: 96/60  Pulse: 78  Resp: 16  Temp: 99.1 F (37.3 C)  TempSrc: Oral  SpO2: 99%  Weight: 160 lb (72.6 kg)     Physical Exam  Constitutional: She appears well-developed and well-nourished. No distress.  HENT:  Head: Normocephalic and atraumatic.  Right Ear: Hearing, tympanic membrane, external ear and ear canal normal.  Left Ear: Hearing, tympanic membrane, external ear and ear canal normal.  Nose: Mucosal edema present. Right sinus exhibits maxillary sinus tenderness. Right sinus exhibits no frontal sinus tenderness. Left sinus exhibits maxillary sinus tenderness. Left sinus exhibits no frontal sinus tenderness.  Mouth/Throat:  Uvula is midline and mucous membranes are normal. Posterior oropharyngeal erythema present. No oropharyngeal exudate or posterior oropharyngeal edema.  Neck: Normal range of motion. Neck supple. No tracheal deviation present. No thyromegaly present.  Cardiovascular: Normal rate, regular rhythm and normal heart sounds.  Exam reveals no gallop and no friction rub.   No murmur heard. Pulmonary/Chest: Effort normal and breath sounds normal. No stridor. No respiratory distress. She has no wheezes. She has no rales.  Lymphadenopathy:    She has no cervical adenopathy.  Skin: She is not diaphoretic.  Vitals reviewed.      Assessment & Plan:     1. Epilepsy, not refractory (HCC) Will check labs as below and f/u pending results. - CBC w/Diff/Platelet - Carbamazepine, Free and Total  2. Neutropenia, unspecified type (HCC) Will  check labs as below and f/u pending results. - CBC w/Diff/Platelet  3. Long-term use of high-risk medication Will check labs as below and f/u pending results. - Comprehensive Metabolic Panel (CMET) - TSH - Carbamazepine, Free and Total  4. Abnormal blood chemistry Will check labs as below and f/u pending results. - Comprehensive Metabolic Panel (CMET)  5. Active autistic disorder Stable.   6. Decreased potassium in the blood Will check labs as below and f/u pending results. - Comprehensive Metabolic Panel (CMET)  7. Pure hypercholesterolemia Previously elevated. Will check labs as below and f/u pending results. - Comprehensive Metabolic Panel (CMET) - Lipid Profile  8. Elevated hemoglobin A1c Had been 5.8 then decreased to 5.4. Will check labs as below and f/u pending results. - Comprehensive Metabolic Panel (CMET) - HgB A1c  9. Subacute maxillary sinusitis Worsening symptoms that have not responded to OTC medications. Will give doxycycline as below. Continue allergy medications. Stay well hydrated and get plenty of rest. Call if no symptom improvement  or if symptoms worsen. - doxycycline (VIBRA-TABS) 100 MG tablet; Take 1 tablet (100 mg total) by mouth 2 (two) times daily.  Dispense: 14 tablet; Refill: 0       Margaretann Loveless, PA-C  [facility]

## 2016-06-24 ENCOUNTER — Telehealth: Payer: Self-pay | Admitting: Physician Assistant

## 2016-06-24 DIAGNOSIS — G40909 Epilepsy, unspecified, not intractable, without status epilepticus: Secondary | ICD-10-CM | POA: Diagnosis not present

## 2016-06-24 DIAGNOSIS — E7800 Pure hypercholesterolemia, unspecified: Secondary | ICD-10-CM | POA: Diagnosis not present

## 2016-06-24 DIAGNOSIS — R05 Cough: Secondary | ICD-10-CM

## 2016-06-24 DIAGNOSIS — R7309 Other abnormal glucose: Secondary | ICD-10-CM | POA: Diagnosis not present

## 2016-06-24 DIAGNOSIS — R799 Abnormal finding of blood chemistry, unspecified: Secondary | ICD-10-CM | POA: Diagnosis not present

## 2016-06-24 DIAGNOSIS — Z79899 Other long term (current) drug therapy: Secondary | ICD-10-CM | POA: Diagnosis not present

## 2016-06-24 DIAGNOSIS — R059 Cough, unspecified: Secondary | ICD-10-CM

## 2016-06-24 DIAGNOSIS — D709 Neutropenia, unspecified: Secondary | ICD-10-CM | POA: Diagnosis not present

## 2016-06-24 DIAGNOSIS — E876 Hypokalemia: Secondary | ICD-10-CM | POA: Diagnosis not present

## 2016-06-24 MED ORDER — BENZONATATE 200 MG PO CAPS: 200.0000 mg | ORAL_CAPSULE | Freq: Two times a day (BID) | ORAL | 0 refills | Status: DC | PRN

## 2016-06-24 NOTE — Telephone Encounter (Signed)
Tessalon perles sent in to [facility] for patient. She is also on ABx already for URI thus she should not need treatment for her eyes. Would recommend to use an OTC eye drop for allergies to lessen redness and itching.

## 2016-06-24 NOTE — Telephone Encounter (Signed)
Patient advised as directed below.  Thanks,  -Joseline 

## 2016-06-24 NOTE — Telephone Encounter (Signed)
Pt's mom [name] stated that pt hasn't improved much since her OV on 06/20/16 and would like Jenni to send something in for cough to [facility]. [name] also stated that pt's eyes were a little pink and would like eye drops sent in as well for pink eye. Please advise. Thanks TNP

## 2016-06-24 NOTE — Telephone Encounter (Signed)
Please Review  Thanks.  -Joanna Robinson

## 2016-06-26 LAB — COMPREHENSIVE METABOLIC PANEL
ALBUMIN: 3.9 g/dL (ref 3.5–5.5)
ALT: 9 IU/L (ref 0–32)
AST: 16 IU/L (ref 0–40)
Albumin/Globulin Ratio: 1.3 (ref 1.2–2.2)
Alkaline Phosphatase: 97 IU/L (ref 39–117)
BUN / CREAT RATIO: 25 — AB (ref 9–23)
BUN: 18 mg/dL (ref 6–20)
Bilirubin Total: <0.2 mg/dL (ref 0.0–1.2)
CALCIUM: 9.1 mg/dL (ref 8.7–10.2)
CO2: 20 mmol/L (ref 18–29)
CREATININE: 0.73 mg/dL (ref 0.57–1.00)
Chloride: 105 mmol/L (ref 96–106)
GFR calc Af Amer: 125 mL/min/{1.73_m2} (ref >59)
GFR, EST NON AFRICAN AMERICAN: 109 mL/min/{1.73_m2} (ref >59)
GLOBULIN, TOTAL: 3.1 g/dL (ref 1.5–4.5)
Glucose: 100 mg/dL — ABNORMAL HIGH (ref 65–99)
Potassium: 3.7 mmol/L (ref 3.5–5.2)
SODIUM: 142 mmol/L (ref 134–144)
TOTAL PROTEIN: 7 g/dL (ref 6.0–8.5)

## 2016-06-26 LAB — CBC WITH DIFFERENTIAL/PLATELET
Basophils Absolute: 0 10*3/uL (ref 0.0–0.2)
Basos: 0 %
EOS (ABSOLUTE): 0.1 10*3/uL (ref 0.0–0.4)
EOS: 2 %
HEMATOCRIT: 38.1 % (ref 34.0–46.6)
HEMOGLOBIN: 12.8 g/dL (ref 11.1–15.9)
IMMATURE GRANS (ABS): 0 10*3/uL (ref 0.0–0.1)
IMMATURE GRANULOCYTES: 0 %
LYMPHS ABS: 0.8 10*3/uL (ref 0.7–3.1)
LYMPHS: 30 %
MCH: 29.5 pg (ref 26.6–33.0)
MCHC: 33.6 g/dL (ref 31.5–35.7)
MCV: 88 fL (ref 79–97)
MONOCYTES: 14 %
Monocytes Absolute: 0.4 10*3/uL (ref 0.1–0.9)
NEUTROS PCT: 54 %
Neutrophils Absolute: 1.4 10*3/uL (ref 1.4–7.0)
Platelets: 214 10*3/uL (ref 150–379)
RBC: 4.34 x10E6/uL (ref 3.77–5.28)
RDW: 13.7 % (ref 12.3–15.4)
WBC: 2.7 10*3/uL — AB (ref 3.4–10.8)

## 2016-06-26 LAB — TSH: TSH: 2.85 u[IU]/mL (ref 0.450–4.500)

## 2016-06-26 LAB — CARBAMAZEPINE, FREE AND TOTAL
Carbamazepine, Free: 3.5 ug/mL (ref 0.6–4.2)
Carbamazepine, Total: 15.3 ug/mL (ref 4.0–12.0)

## 2016-06-26 LAB — LIPID PANEL
CHOLESTEROL TOTAL: 238 mg/dL — AB (ref 100–199)
Chol/HDL Ratio: 2.6 ratio (ref 0.0–4.4)
HDL: 93 mg/dL (ref >39)
LDL CALC: 137 mg/dL — AB (ref 0–99)
TRIGLYCERIDES: 39 mg/dL (ref 0–149)
VLDL Cholesterol Cal: 8 mg/dL (ref 5–40)

## 2016-06-26 LAB — HEMOGLOBIN A1C
Est. average glucose Bld gHb Est-mCnc: 108 mg/dL
Hgb A1c MFr Bld: 5.4 % (ref 4.8–5.6)

## 2016-07-25 ENCOUNTER — Telehealth: Payer: Self-pay | Admitting: Family

## 2016-07-25 ENCOUNTER — Other Ambulatory Visit: Payer: Self-pay | Admitting: Physician Assistant

## 2016-07-25 DIAGNOSIS — G40209 Localization-related (focal) (partial) symptomatic epilepsy and epileptic syndromes with complex partial seizures, not intractable, without status epilepticus: Secondary | ICD-10-CM

## 2016-07-25 DIAGNOSIS — G479 Sleep disorder, unspecified: Secondary | ICD-10-CM

## 2016-07-25 DIAGNOSIS — R609 Edema, unspecified: Secondary | ICD-10-CM

## 2016-07-25 MED ORDER — CARBATROL 300 MG PO CP12: ORAL_CAPSULE | ORAL | 5 refills | Status: DC

## 2016-07-25 MED ORDER — ACETAZOLAMIDE 250 MG PO TABS: ORAL_TABLET | ORAL | 5 refills | Status: DC

## 2016-07-25 MED ORDER — CARBATROL 200 MG PO CP12: ORAL_CAPSULE | ORAL | 5 refills | Status: DC

## 2016-07-25 MED ORDER — HYDROCHLOROTHIAZIDE 25 MG PO TABS: 25.0000 mg | ORAL_TABLET | Freq: Every day | ORAL | 1 refills | Status: DC

## 2016-07-25 MED ORDER — CLONIDINE HCL 0.2 MG PO TABS: 0.2000 mg | ORAL_TABLET | Freq: Every day | ORAL | 1 refills | Status: DC

## 2016-07-25 NOTE — Telephone Encounter (Signed)
Maralyn SagoSarah, ok to approve refills. Inetta Fermoina

## 2016-07-25 NOTE — Telephone Encounter (Signed)
  Who's calling (name and relationship to patient) :[facility]  Best contact [phone]  Provider they VWU:JWJXsee:Tina  Reason for call:They need a verbal for Rx refills. Mom called them early this morning to get everything refilled due to they are leaving for out of town for an extended trip. Pharmacy does not have refill orders     PRESCRIPTION REFILL ONLY  Name of prescription:Carbatrol 300 and 200mg , Acetazolamide250,  Pharmacy:[facility]

## 2016-07-25 NOTE — Telephone Encounter (Signed)
Marchelle FolksAmanda with [facility] contacted the office for refill request on the following medications:  [facility].  CB#[id number]/MW  hydrochlorothiazide (HYDRODIURIL) 25 MG tablet  cloNIDine (CATAPRES) 0.2 MG tablet  Per Marchelle FolksAmanda pt mom is requesting these sent to the pharmacy by 9:30 this morning due to she is going out of town/MW

## 2016-08-13 ENCOUNTER — Telehealth: Payer: Self-pay | Admitting: Physician Assistant

## 2016-08-13 ENCOUNTER — Telehealth (INDEPENDENT_AMBULATORY_CARE_PROVIDER_SITE_OTHER): Payer: Self-pay | Admitting: Family

## 2016-08-13 DIAGNOSIS — F840 Autistic disorder: Secondary | ICD-10-CM

## 2016-08-13 DIAGNOSIS — F79 Unspecified intellectual disabilities: Secondary | ICD-10-CM

## 2016-08-13 NOTE — Telephone Encounter (Signed)
I left a message for Mom and asked her to return my call. TG 

## 2016-08-13 NOTE — Telephone Encounter (Signed)
°  Who's calling (name and relationship to patient) : Johnson,[name] Best contact number: [phone] Provider they see: Inetta Fermoina, NP Reason for call: Mother called in regards to needing a written letter stating child is still "autistic". She stated Inetta Fermoina is familiar with the letter needed.     PRESCRIPTION REFILL ONLY  Name of prescription:  Pharmacy:

## 2016-08-13 NOTE — Telephone Encounter (Signed)
Pt's mom called needing a letter stating that [name] is still autistic and needs a nurse to be with her during the day so mom can work.  Please call mom when letter is ready for pick up.  Mom knows Boneta LucksJenny is out of the office until Tuesday.  Thanks Fortune Brandsteri

## 2016-08-13 NOTE — Telephone Encounter (Signed)
Mom called back again stating that she has talked to [facility] getting referral for a new psychologist.  Mom has not heard anything back and is asking when pt will be referred to the new psychologist.  CB# [phone] or [phone]/MW

## 2016-08-15 NOTE — Telephone Encounter (Signed)
I called and talked to [facility]. She said that Joanna Robinson's behavior was slower and more self directed, and that she had applied for more help with her (in terms of caregiver hours). Mom said that she was using the wheelchair more because Joanna Robinson would stop and stand and not move in public places, and that she had to feed her now becaue Joanna Robinson would sit and stare into space, rather than feed herself when food was presented. She said that Joanna Robinson could tolerate being outdoors but was becoming intolerant of being in stores, so she could no longer take her with her when she went grocery shopping. Mom said that Joanna Robinson would become jumpy and upset in stores with florescent lights and noise, and then after returning home that her tendency to not move would be even worse for a couple of days afterwards. She said that Joanna Robinson was evaluated for the increase in caregiver hours and Mom was asked to provider a letter from her medical providers regarding her condition and needs. Mom said that the person evaluating her asked if Joanna Robinson could stay home alone since she was 89 years old, and if the autism would be an ongoing condition, and asked for that to be addressed in the letter. I told Mom that I would write the letter and mail it to her. TG

## 2016-08-15 NOTE — Telephone Encounter (Signed)
I reviewed your note, read the letter and I am very appreciative.

## 2016-08-20 ENCOUNTER — Encounter: Payer: Self-pay | Admitting: Physician Assistant

## 2016-08-20 NOTE — Telephone Encounter (Signed)
Letter printed for Joanna HeckDanielle to have a caregiver.   Referral to psychologist is pending.

## 2016-08-26 ENCOUNTER — Other Ambulatory Visit: Payer: Self-pay | Admitting: Physician Assistant

## 2016-08-26 MED ORDER — FLUVOXAMINE MALEATE 100 MG PO TABS: 100.0000 mg | ORAL_TABLET | Freq: Two times a day (BID) | ORAL | 1 refills | Status: DC

## 2016-08-26 NOTE — Telephone Encounter (Signed)
[facility] faxed a refill request on the following medications:  fluvoxaMINE (LUVOX) 100 MG. 180  tablets.  Take 1 tablet by mouth twice daily.  [facility]/MW

## 2016-08-26 NOTE — Telephone Encounter (Signed)
Marchelle FolksAmanda with [facility] stated they had faxed multiple refill request to our office for fluvoxaMINE (LUVOX) 100 MG tablet and pt is due to have the medication today. I advised that we only received the fax this morning. Marchelle Folksmanda request the message be marked urgent and request the Rx be sent today if possible. Please advise. Thanks TNP

## 2016-09-16 ENCOUNTER — Other Ambulatory Visit: Payer: Self-pay | Admitting: Physician Assistant

## 2016-09-16 DIAGNOSIS — H10401 Unspecified chronic conjunctivitis, right eye: Secondary | ICD-10-CM

## 2016-09-16 MED ORDER — SULFACETAMIDE SODIUM 10 % OP SOLN: OPHTHALMIC | 5 refills | Status: DC

## 2016-09-16 NOTE — Telephone Encounter (Signed)
Refill on eyedrops for redness and drainage  sulfacetamide (BLEPH-10) 10 % ophthalmic solution  [name]  Deliver please  Thanks Joanna Robinson

## 2016-09-16 NOTE — Telephone Encounter (Signed)
Last ov 06/20/16  Last filled 02/01/16 Please review. Thank you. sd

## 2016-10-31 ENCOUNTER — Telehealth: Payer: Self-pay

## 2016-10-31 NOTE — Telephone Encounter (Signed)
Mother, Sharion DoveYolanda Johnson, called and states that she needs a letter that states patient still has seizure, autistic, that she gets headaches daily due to seizures and eye issues-such as blurry eyes, turn red and ache and its coming from seizures. She needs to turn this in so she can have a nurse aid provided to the patient because her mother works and needs someone there with patient. Similar letter to may letter but states it needs to mention headaches and eye issues. Patient coming Monday and would like to pick it up then-aa

## 2016-11-03 ENCOUNTER — Encounter: Payer: Self-pay | Admitting: Physician Assistant

## 2016-11-03 ENCOUNTER — Telehealth (INDEPENDENT_AMBULATORY_CARE_PROVIDER_SITE_OTHER): Payer: Self-pay | Admitting: Family

## 2016-11-03 ENCOUNTER — Ambulatory Visit (INDEPENDENT_AMBULATORY_CARE_PROVIDER_SITE_OTHER): Payer: Medicare Other | Admitting: Physician Assistant

## 2016-11-03 DIAGNOSIS — H10401 Unspecified chronic conjunctivitis, right eye: Secondary | ICD-10-CM | POA: Diagnosis not present

## 2016-11-03 DIAGNOSIS — J0100 Acute maxillary sinusitis, unspecified: Secondary | ICD-10-CM | POA: Diagnosis not present

## 2016-11-03 MED ORDER — AZELASTINE-FLUTICASONE 137-50 MCG/ACT NA SUSP: 1.0000 | Freq: Two times a day (BID) | NASAL | 3 refills | Status: DC

## 2016-11-03 MED ORDER — DOXYCYCLINE HYCLATE 100 MG PO TABS: 100.0000 mg | ORAL_TABLET | Freq: Two times a day (BID) | ORAL | 0 refills | Status: DC

## 2016-11-03 MED ORDER — SULFACETAMIDE SODIUM 10 % OP SOLN: OPHTHALMIC | 5 refills | Status: DC

## 2016-11-03 NOTE — Telephone Encounter (Signed)
Patient's mother called requesting if another letter could be written stating, "what condition Joanna Robinson is in," so that she can get the nurse for more hours. Mom states that if she talks with NP, she can better explain what all needs to be in the letter. Please advise.

## 2016-11-03 NOTE — Progress Notes (Signed)
Patient: Joanna Robinson Female    DOB: 1980-12-07   36 y.o.   MRN: [id number] Visit Date: 11/03/2016  Today's Provider: Margaretann Loveless, PA-C   Chief Complaint  Patient presents with  . URI   Subjective:    URI   This is a new problem. The current episode started 1 to 4 weeks ago (Per mother patient develops a HA and eye pain-right side a week before her period and after finishing and now she has a stuffy nose.). The problem has been unchanged. There has been no fever. Associated symptoms include congestion, headaches, rhinorrhea, sinus pain, sneezing and wheezing (a little). Pertinent negatives include no chest pain, coughing, ear pain, plugged ear sensation or sore throat.      Allergies  Allergen Reactions  . Codeine   . Corticosteroids Nausea Only     Current Outpatient Prescriptions:  .  acetaZOLAMIDE (DIAMOX) 250 MG tablet, Take 1 tab by mouth twice daily, Disp: 60 tablet, Rfl: 5 .  Alpha-D-Galactosidase (BEANO) TABS, Take by mouth as needed (2-3 Tabs as needed prior to meal that may cause bloating.)., Disp: , Rfl:  .  Aluminum & Magnesium Hydroxide (MYLANTA ULTIMATE STRENGTH) 500-500 MG/5ML SUSP, MYLANTA ULTIMATE STRENGTH, 500-500MG /5ML (Oral Suspension)  1 TID as needed for 0 days  Quantity: 0.00;  Refills: 0   Ordered :14-Mar-2010  Denna Haggard, MA, Anastasiya ;  Started 19-October-2008 Active Comments: Medication taken as needed. , Disp: , Rfl:  .  benzonatate (TESSALON) 200 MG capsule, Take 1 capsule (200 mg total) by mouth 2 (two) times daily as needed for cough., Disp: 20 capsule, Rfl: 0 .  benzoyl peroxide 5 % gel, Apply 5 % topically daily. Apply to face every morning., Disp: , Rfl:  .  benzoyl peroxide-erythromycin (BENZAMYCIN) gel, Apply topically 2 (two) times daily., Disp: 23.3 g, Rfl: 5 .  CARBATROL 200 MG 12 hr capsule, Give [place]lyn 2 capsules in the morning and 1 capsule in the evening, Disp: 90 capsule, Rfl: 5 .  CARBATROL 300 MG 12 hr capsule, Take  1 capsule at bedtime along with the Carbatrol 200mg  capsule, Disp: 30 capsule, Rfl: 5 .  cloNIDine (CATAPRES) 0.2 MG tablet, Take 1 tablet (0.2 mg total) by mouth at bedtime., Disp: 90 tablet, Rfl: 1 .  Dextromethorphan-Menthol (DELSYM COUGH RELIEF MT), Use as directed in the mouth or throat as needed (2 Tsp. by mouth every 12 hours as needed for cough.). Reported on 04/07/2015, Disp: , Rfl:  .  doxycycline (VIBRA-TABS) 100 MG tablet, Take 1 tablet (100 mg total) by mouth 2 (two) times daily., Disp: 14 tablet, Rfl: 0 .  erythromycin ophthalmic ointment, 1 application 2 (two) times daily as needed (Apply to affected area twicw daily PRN)., Disp: , Rfl:  .  fexofenadine (ALLEGRA ALLERGY) 180 MG tablet, Take 1 tablet (180 mg total) by mouth daily., Disp: 90 tablet, Rfl: 3 .  fluticasone (FLONASE) 50 MCG/ACT nasal spray, Place 2 sprays into both nostrils daily., Disp: 48 g, Rfl: 1 .  fluvoxaMINE (LUVOX) 100 MG tablet, Take 1 tablet (100 mg total) by mouth 2 (two) times daily., Disp: 180 tablet, Rfl: 1 .  furosemide (LASIX) 20 MG tablet, Take 1 tablet by mouth daily., Disp: , Rfl:  .  GNP PAIN RELIEF EX-STRENGTH 500 MG tablet, TAKE 2 TABLETS BY MOUTH 3 TIMES DAILY ASNEEDED FOR HEADACHE, Disp: 60 tablet, Rfl: 5 .  hydrochlorothiazide (HYDRODIURIL) 25 MG tablet, Take 1 tablet (25 mg total) by mouth  daily., Disp: 90 tablet, Rfl: 1 .  hydrocortisone (ANUSOL-HC) 2.5 % rectal cream, Place 1 application rectally 2 (two) times daily., Disp: , Rfl:  .  montelukast (SINGULAIR) 10 MG tablet, Take 1 tablet (10 mg total) by mouth daily., Disp: 90 tablet, Rfl: 3 .  naproxen (NAPROSYN) 500 MG tablet, TAKE 1 TABLET BY MOUTH TWICE DAILY FOR ENTIRE MENSTRUAL CYCLE., Disp: 20 tablet, Rfl: 5 .  omega-3 acid ethyl esters (LOVAZA) 1 G capsule, Take 1 g by mouth daily., Disp: , Rfl:  .  polyethylene glycol powder (GLYCOLAX/MIRALAX) powder, Take 17 g by mouth at bedtime., Disp: 3350 g, Rfl: 3 .  potassium chloride (KLOR-CON) 20  MEQ packet, DISSOLVE CONTENTS OF 1 PACKET IN AT LEAST 4 OZ OF WATER/JUICE AND DRINK ONCEDAILY, Disp: 30 packet, Rfl: 11 .  sodium fluoride (DENTA 5000 PLUS) 1.1 % CREA dental cream, Place 1 application onto teeth every evening. Brush teeth daily for 1 minute, rinse and spit., Disp: 1 Tube, Rfl: 5 .  sulfacetamide (BLEPH-10) 10 % ophthalmic solution, Apply two drops to right eye 4 x day., Disp: 15 mL, Rfl: 5 .  Sunscreens (AVEENO CLEAR COMPLEXION BB) CREA, Apply as directed bid, Disp: 75 mL, Rfl: 11 .  SYSTANE ULTRA 0.4-0.3 % SOLN, PLACE 1 DROP INTO EACH EYE AT BEDTIME AND UP TO 4 TIMES DAILY AS NEEDED FOR REDNESS., Disp: 10 mL, Rfl: 5 .  TRIAMCINOLONE ACETONIDE, TOP, 0.05 % OINT, Apply to rash as needed TID., Disp: 85 g, Rfl: 5 .  Witch Hazel (TUCKS) 50 % PADS, Apply 50 % topically as needed (As needed for cleansing and hemorrhoid discomfort.)., Disp: , Rfl:   Review of Systems  Constitutional: Negative for chills and fever.  HENT: Positive for congestion, postnasal drip, rhinorrhea, sinus pain, sinus pressure and sneezing. Negative for ear pain, sore throat, tinnitus, trouble swallowing and voice change.   Eyes: Positive for pain (right side she usually gets this pain with a headche when she gets her cycle).  Respiratory: Positive for wheezing (a little). Negative for cough, chest tightness and shortness of breath.   Cardiovascular: Negative for chest pain, palpitations and leg swelling.  Gastrointestinal: Negative.   Genitourinary: Negative.   Neurological: Positive for headaches.    Social History  Substance Use Topics  . Smoking status: Never Smoker  . Smokeless tobacco: Never Used  . Alcohol use No   Objective:   BP 102/60 (BP Location: Left Arm, Patient Position: Sitting, Cuff Size: Normal)   Pulse 61   Temp 98.3 F (36.8 C) (Oral)   Resp 16   Wt 164 lb 6.4 oz (74.6 kg)   LMP 10/30/2016 (Approximate)   SpO2 97%   BMI 25.00 kg/m    Physical Exam  Constitutional: She  appears well-developed and well-nourished. No distress.  HENT:  Head: Normocephalic and atraumatic.  Right Ear: Hearing, tympanic membrane, external ear and ear canal normal.  Left Ear: Hearing, tympanic membrane, external ear and ear canal normal.  Nose: Mucosal edema (right side) present. Right sinus exhibits no maxillary sinus tenderness and no frontal sinus tenderness. Left sinus exhibits no maxillary sinus tenderness and no frontal sinus tenderness.  Mouth/Throat: Uvula is midline, oropharynx is clear and moist and mucous membranes are normal. No oropharyngeal exudate, posterior oropharyngeal edema or posterior oropharyngeal erythema.  Right side ethmoid tenderness  Neck: Normal range of motion. Neck supple. No tracheal deviation present. No thyromegaly present.  Cardiovascular: Normal rate, regular rhythm and normal heart sounds.  Exam reveals no  gallop and no friction rub.   No murmur heard. Pulmonary/Chest: Effort normal and breath sounds normal. No stridor. No respiratory distress. She has no wheezes. She has no rales.  Lymphadenopathy:    She has no cervical adenopathy.  Skin: She is not diaphoretic.  Vitals reviewed.      Assessment & Plan:     1. Acute maxillary sinusitis, recurrence not specified Worsening symptoms that have not responded to OTC medications. Will give augmentin as below. Continue allergy medications. Stay well hydrated and get plenty of rest. Call if no symptom improvement or if symptoms worsen. Flonase changed to Dymista. Suspect menstrual migraines as well since symptoms onset 5 days before menstrual cycle and continue during her menstrual cycle. Discussed adding depoprovera but patient's mother is going to call her neurologist to see if they would agree with trying. Combo OCP caused seizures in the past.  - doxycycline (VIBRA-TABS) 100 MG tablet; Take 1 tablet (100 mg total) by mouth 2 (two) times daily.  Dispense: 14 tablet; Refill: 0  2. Chronic  conjunctivitis of right eye, unspecified chronic conjunctivitis type Stable. Diagnosis pulled for medication refill. Continue current medical treatment plan. - sulfacetamide (BLEPH-10) 10 % ophthalmic solution; Apply two drops to right eye 4 x day.  Dispense: 15 mL; Refill: 5       Margaretann LovelessJennifer M Burnette, PA-C  [facility]

## 2016-11-03 NOTE — Telephone Encounter (Signed)
Note completed and handed to mother personally today during OV

## 2016-11-03 NOTE — Telephone Encounter (Signed)
I called and talked with Mom, Sharion DoveYolanda Johnson. She said that [employer] service hours had been reduced because the agency did not understand why having a menstrual period caused Ahna to have increased seizures. Mom asked if I would write a letter explaining this and call her when it was ready. I agreed to write the letter and will call Mom. TG

## 2016-11-03 NOTE — Patient Instructions (Signed)
Azelastine; Fluticasone nasal spray What is this medicine? AZELASTINE; FLUTICASONE (a ZEL as teen; floo TIK a sone) is a combination of a histamine blocker and a corticosteroid. This medicine is used to treat the symptoms of allergies like sneezing, itching, and runny or stuffy nose. This medicine may be used for other purposes; ask your health care provider or pharmacist if you have questions. COMMON BRAND NAME(S): Dymista What should I tell my health care provider before I take this medicine? They need to know if you have any of these conditions: -cataracts -glaucoma -infection, like tuberculosis, herpes, or fungal infection -recent surgery or injury of the nose or sinuses -taking a corticosteroid by mouth -an unusual or allergic reaction to azelastine, fluticasone, steroids, other medicines, foods, dyes, or preservatives -pregnant or trying to get pregnant -breast-feeding How should I use this medicine? This medicine is for use in the nose. Follow the directions on the prescription label. Shake well before using. Do not use more often than directed. Make sure that you are using your nasal spray correctly. Ask you doctor or health care provider if you have any questions. Talk to your pediatrician regarding the use of this medicine in children. While this drug may be prescribed for children as young as 6 years for selected conditions, precautions do apply. Overdosage: If you think you have taken too much of this medicine contact a poison control center or emergency room at once. NOTE: This medicine is only for you. Do not share this medicine with others. What if I miss a dose? If you miss a dose, use it as soon as you can. If it is almost time for your next dose, use only that dose. Do not use double or extra doses. What may interact with this medicine? -alcohol -certain medicines for anxiety or sleep -cimetidine -ketoconazole -metyrapone -other antihistamines -some medicines for  HIV -vaccines This list may not describe all possible interactions. Give your health care provider a list of all the medicines, herbs, non-prescription drugs, or dietary supplements you use. Also tell them if you smoke, drink alcohol, or use illegal drugs. Some items may interact with your medicine. What should I watch for while using this medicine? Tell your doctor or healthcare professional if your symptoms do not start to get better or if they get worse. You may get drowsy or dizzy. Drinking alcohol or taking medicine that causes drowsiness can make this worse. Do not drive, use machinery, or do anything that needs mental alertness until you know how this medicine affects you. This medicine may increase your risk of getting an infection. Tell your doctor or health care professional if you are around anyone with measles or chickenpox, or if you develop sores or blisters that do not heal properly. What side effects may I notice from receiving this medicine? Side effects that you should report to your doctor or health care professional as soon as possible: -allergic reactions like skin rash, itching or hives, swelling of the face, lips, or tongue -breathing problems -changes in vision -fast heartbeat -flu-like symptoms -high blood pressure -infection -nose bleeding, sores -white patches or sores in the mouth or nose Side effects that usually do not require medical attention (report to your doctor or health care professional if they continue or are bothersome): -changes in smell or taste -cough -feeling tired -headache -larger appetite or weight gain -nose or throat irritation -sneezing This list may not describe all possible side effects. Call your doctor for medical advice about side effects. You  may report side effects to FDA at 1-800-FDA-1088. Where should I keep my medicine? Keep out of the reach of children. Store upright and tightly closed at room temperature between 20 and 25  degrees C (68 and 77 degrees F). Do not freeze. Throw away any unused medicine after the expiration date or after 120 sprays, whichever comes first. NOTE: This sheet is a summary. It may not cover all possible information. If you have questions about this medicine, talk to your doctor, pharmacist, or health care provider.  2018 Elsevier/Gold Standard (2015-04-12 12:04:59)

## 2016-11-04 NOTE — Telephone Encounter (Signed)
Sarah, please call Mom and let her know that the letter is ready. She said that she would give me a fax number and name to fax that she wants the letter faxed to. Thanks, Inetta Fermoina

## 2016-11-04 NOTE — Telephone Encounter (Signed)
Call to [name]- adv letter is ready- requested it be faxed to  1. Joanna Robinson [phone] 2. Joanna Robinson Attn: Joanna Robinson [phone] And mail a copy to the home. Advised may be late afternoon or tomorrow before can fax due to machine not working at this time. Mom states understanding.

## 2016-11-04 NOTE — Telephone Encounter (Signed)
I faxed letter to both places and mailed copy to Mom as requested. TG

## 2016-11-10 ENCOUNTER — Telehealth: Payer: Self-pay | Admitting: Physician Assistant

## 2016-11-10 DIAGNOSIS — J029 Acute pharyngitis, unspecified: Secondary | ICD-10-CM

## 2016-11-10 NOTE — Telephone Encounter (Signed)
Pt mom called states pt is now complaining of a sore throat and also has a cough.  Mom is requesting something to help with this.  [facility].  This is to be delivered to pt home. CB#[phone] or [phone]/MW

## 2016-11-10 NOTE — Telephone Encounter (Signed)
Please Review

## 2016-11-11 MED ORDER — AMOXICILLIN 875 MG PO TABS: 875.0000 mg | ORAL_TABLET | Freq: Two times a day (BID) | ORAL | 0 refills | Status: DC

## 2016-11-11 NOTE — Telephone Encounter (Signed)
Pt mom called again/MW

## 2016-11-11 NOTE — Telephone Encounter (Signed)
Patient's mom advised 

## 2016-11-11 NOTE — Telephone Encounter (Signed)
Amoxil sent in

## 2017-01-19 ENCOUNTER — Other Ambulatory Visit (INDEPENDENT_AMBULATORY_CARE_PROVIDER_SITE_OTHER): Payer: Self-pay | Admitting: Family

## 2017-01-19 DIAGNOSIS — G40209 Localization-related (focal) (partial) symptomatic epilepsy and epileptic syndromes with complex partial seizures, not intractable, without status epilepticus: Secondary | ICD-10-CM

## 2017-01-19 MED ORDER — CARBATROL 200 MG PO CP12: ORAL_CAPSULE | ORAL | 5 refills | Status: DC

## 2017-01-19 MED ORDER — CARBATROL 300 MG PO CP12: ORAL_CAPSULE | ORAL | 5 refills | Status: DC

## 2017-01-20 ENCOUNTER — Other Ambulatory Visit: Payer: Self-pay | Admitting: Physician Assistant

## 2017-01-20 DIAGNOSIS — R609 Edema, unspecified: Secondary | ICD-10-CM

## 2017-01-20 DIAGNOSIS — G479 Sleep disorder, unspecified: Secondary | ICD-10-CM

## 2017-01-20 MED ORDER — HYDROCHLOROTHIAZIDE 25 MG PO TABS: 25.0000 mg | ORAL_TABLET | Freq: Every day | ORAL | 1 refills | Status: DC

## 2017-01-20 MED ORDER — FLUVOXAMINE MALEATE 100 MG PO TABS: 100.0000 mg | ORAL_TABLET | Freq: Two times a day (BID) | ORAL | 1 refills | Status: DC

## 2017-01-20 MED ORDER — CLONIDINE HCL 0.2 MG PO TABS: 0.2000 mg | ORAL_TABLET | Freq: Every day | ORAL | 1 refills | Status: DC

## 2017-01-20 NOTE — Telephone Encounter (Signed)
[facility] faxed a request for the following medications. Thanks CC  cloNIDine (CATAPRES) 0.2 MG tablet  >Take 1 Tablet by mouth at bedtime.  hydrochlorothiazide (HYDRODIURIL) 25 MG tablet  >Take 1 tablet by mouth once daily.  fluvoxaMINE (LUVOX) 100 MG tablet  >Take 1 tablet by mouth twice daily.

## 2017-01-27 ENCOUNTER — Ambulatory Visit (INDEPENDENT_AMBULATORY_CARE_PROVIDER_SITE_OTHER): Payer: Medicare Other | Admitting: Family

## 2017-02-02 ENCOUNTER — Encounter: Payer: Self-pay | Admitting: Physician Assistant

## 2017-02-02 ENCOUNTER — Ambulatory Visit (INDEPENDENT_AMBULATORY_CARE_PROVIDER_SITE_OTHER): Payer: Medicare Other | Admitting: Physician Assistant

## 2017-02-02 ENCOUNTER — Ambulatory Visit (INDEPENDENT_AMBULATORY_CARE_PROVIDER_SITE_OTHER): Payer: Medicare Other | Admitting: Family

## 2017-02-02 VITALS — BP 120/70 | HR 93 | Temp 98.4°F | Resp 16 | Wt 164.0 lb

## 2017-02-02 DIAGNOSIS — L2082 Flexural eczema: Secondary | ICD-10-CM | POA: Diagnosis not present

## 2017-02-02 DIAGNOSIS — H10401 Unspecified chronic conjunctivitis, right eye: Secondary | ICD-10-CM | POA: Diagnosis not present

## 2017-02-02 DIAGNOSIS — J301 Allergic rhinitis due to pollen: Secondary | ICD-10-CM | POA: Diagnosis not present

## 2017-02-02 DIAGNOSIS — L700 Acne vulgaris: Secondary | ICD-10-CM | POA: Diagnosis not present

## 2017-02-02 DIAGNOSIS — G40909 Epilepsy, unspecified, not intractable, without status epilepticus: Secondary | ICD-10-CM | POA: Diagnosis not present

## 2017-02-02 DIAGNOSIS — J0140 Acute pansinusitis, unspecified: Secondary | ICD-10-CM | POA: Diagnosis not present

## 2017-02-02 MED ORDER — ERYTHROMYCIN 5 MG/GM OP OINT
1.0000 | TOPICAL_OINTMENT | Freq: Two times a day (BID) | OPHTHALMIC | 5 refills | Status: DC | PRN
Start: 2017-02-02 — End: 2017-08-14

## 2017-02-02 MED ORDER — TRIAMCINOLONE ACETONIDE 0.05 % EX OINT: TOPICAL_OINTMENT | CUTANEOUS | 5 refills | Status: DC

## 2017-02-02 MED ORDER — AMOXICILLIN-POT CLAVULANATE 875-125 MG PO TABS: 1.0000 | ORAL_TABLET | Freq: Two times a day (BID) | ORAL | 0 refills | Status: DC

## 2017-02-02 MED ORDER — AZELASTINE-FLUTICASONE 137-50 MCG/ACT NA SUSP: 1.0000 | Freq: Two times a day (BID) | NASAL | 3 refills | Status: DC

## 2017-02-02 MED ORDER — BENZOYL PEROXIDE-ERYTHROMYCIN 5-3 % EX GEL: Freq: Two times a day (BID) | CUTANEOUS | 5 refills | Status: DC

## 2017-02-02 MED ORDER — SULFACETAMIDE SODIUM 10 % OP SOLN: OPHTHALMIC | 5 refills | Status: DC

## 2017-02-02 NOTE — Progress Notes (Signed)
Patient: Joanna Robinson Female    DOB: [birth year]   36 y.o.   MRN: [id number] Visit Date: 02/02/2017  Today's Provider: Margaretann LovelessJennifer M Little Winton, PA-C   Chief Complaint  Patient presents with  . URI   Subjective:    HPI Upper Respiratory Infection: Patient complains of symptoms of a URI. Symptoms include cough and sore throat. Onset of symptoms was 3 days ago, gradually worsening since that time. She also c/o fever 100 and sneezing for the past 3 days .  She is drinking plenty of fluids. Evaluation to date: none. Treatment to date: Tylenol.  Patient's mother is requesting refill on benzoyl peroxide-erythromycin, erythromycin ophthalmic ointment, sulfacetamide 10% ophthalmic, and triamcinolone acetonide.     Allergies  Allergen Reactions  . Codeine   . Corticosteroids Nausea Only     Current Outpatient Medications:  .  acetaZOLAMIDE (DIAMOX) 250 MG tablet, Take 1 tab by mouth twice daily, Disp: 60 tablet, Rfl: 5 .  Alpha-D-Galactosidase (BEANO) TABS, Take by mouth as needed (2-3 Tabs as needed prior to meal that may cause bloating.)., Disp: , Rfl:  .  Aluminum & Magnesium Hydroxide (MYLANTA ULTIMATE STRENGTH) 500-500 MG/5ML SUSP, MYLANTA ULTIMATE STRENGTH, 500-500MG /5ML (Oral Suspension)  1 TID as needed for 0 days  Quantity: 0.00;  Refills: 0   Ordered :14-Mar-2010  Joanna HaggardAleksandrova, MA, Anastasiya ;  Started 19-October-2008 Active Comments: Medication taken as needed. , Disp: , Rfl:  .  Azelastine-Fluticasone 137-50 MCG/ACT SUSP, Place 1 spray into the nose 2 (two) times daily., Disp: 23 g, Rfl: 3 .  benzoyl peroxide 5 % gel, Apply 5 % topically daily. Apply to face every morning., Disp: , Rfl:  .  benzoyl peroxide-erythromycin (BENZAMYCIN) gel, Apply topically 2 (two) times daily., Disp: 23.3 g, Rfl: 5 .  CARBATROL 200 MG 12 hr capsule, Give Joanna Robinson 2 capsules in the morning and 1 capsule in the evening, Disp: 90 capsule, Rfl: 5 .  CARBATROL 300 MG 12 hr capsule, Take 1 capsule at  bedtime along with the Carbatrol 200mg  capsule, Disp: 30 capsule, Rfl: 5 .  cloNIDine (CATAPRES) 0.2 MG tablet, Take 1 tablet (0.2 mg total) by mouth at bedtime., Disp: 90 tablet, Rfl: 1 .  Dextromethorphan-Menthol (DELSYM COUGH RELIEF MT), Use as directed in the mouth or throat as needed (2 Tsp. by mouth every 12 hours as needed for cough.). Reported on 04/07/2015, Disp: , Rfl:  .  erythromycin ophthalmic ointment, 1 application 2 (two) times daily as needed (Apply to affected area twicw daily PRN)., Disp: , Rfl:  .  fexofenadine (ALLEGRA ALLERGY) 180 MG tablet, Take 1 tablet (180 mg total) by mouth daily., Disp: 90 tablet, Rfl: 3 .  fluvoxaMINE (LUVOX) 100 MG tablet, Take 1 tablet (100 mg total) by mouth 2 (two) times daily., Disp: 180 tablet, Rfl: 1 .  furosemide (LASIX) 20 MG tablet, Take 1 tablet by mouth daily., Disp: , Rfl:  .  GNP PAIN RELIEF EX-STRENGTH 500 MG tablet, TAKE 2 TABLETS BY MOUTH 3 TIMES DAILY ASNEEDED FOR HEADACHE, Disp: 60 tablet, Rfl: 5 .  hydrochlorothiazide (HYDRODIURIL) 25 MG tablet, Take 1 tablet (25 mg total) by mouth daily., Disp: 90 tablet, Rfl: 1 .  hydrocortisone (ANUSOL-HC) 2.5 % rectal cream, Place 1 application rectally 2 (two) times daily., Disp: , Rfl:  .  montelukast (SINGULAIR) 10 MG tablet, Take 1 tablet (10 mg total) by mouth daily., Disp: 90 tablet, Rfl: 3 .  naproxen (NAPROSYN) 500 MG tablet, TAKE 1 TABLET  BY MOUTH TWICE DAILY FOR ENTIRE MENSTRUAL CYCLE., Disp: 20 tablet, Rfl: 5 .  omega-3 acid ethyl esters (LOVAZA) 1 G capsule, Take 1 g by mouth daily., Disp: , Rfl:  .  polyethylene glycol powder (GLYCOLAX/MIRALAX) powder, Take 17 g by mouth at bedtime., Disp: 3350 g, Rfl: 3 .  potassium chloride (KLOR-CON) 20 MEQ packet, DISSOLVE CONTENTS OF 1 PACKET IN AT LEAST 4 OZ OF WATER/JUICE AND DRINK ONCEDAILY, Disp: 30 packet, Rfl: 11 .  sodium fluoride (DENTA 5000 PLUS) 1.1 % CREA dental cream, Place 1 application onto teeth every evening. Brush teeth daily for 1  minute, rinse and spit., Disp: 1 Tube, Rfl: 5 .  sulfacetamide (BLEPH-10) 10 % ophthalmic solution, Apply two drops to right eye 4 x day., Disp: 15 mL, Rfl: 5 .  Sunscreens (AVEENO CLEAR COMPLEXION BB) CREA, Apply as directed bid, Disp: 75 mL, Rfl: 11 .  SYSTANE ULTRA 0.4-0.3 % SOLN, PLACE 1 DROP INTO EACH EYE AT BEDTIME AND UP TO 4 TIMES DAILY AS NEEDED FOR REDNESS., Disp: 10 mL, Rfl: 5 .  TRIAMCINOLONE ACETONIDE, TOP, 0.05 % OINT, Apply to rash as needed TID., Disp: 85 g, Rfl: 5 .  Witch Hazel (TUCKS) 50 % PADS, Apply 50 % topically as needed (As needed for cleansing and hemorrhoid discomfort.)., Disp: , Rfl:   Review of Systems  Constitutional: Positive for fever.  HENT: Positive for congestion, postnasal drip, rhinorrhea, sinus pressure, sneezing and sore throat. Negative for ear pain, sinus pain and trouble swallowing.   Respiratory: Positive for cough. Negative for chest tightness and shortness of breath.   Cardiovascular: Negative.   Gastrointestinal: Negative.   Neurological: Negative for dizziness and headaches.    Social History   Tobacco Use  . Smoking status: Never Smoker  . Smokeless tobacco: Never Used  Substance Use Topics  . Alcohol use: No   Objective:   BP 120/70 (BP Location: Left Arm, Patient Position: Sitting, Cuff Size: Normal)   Pulse 93   Temp 98.4 F (36.9 C) (Oral)   Resp 16   Wt 164 lb (74.4 kg)   SpO2 97%   BMI 24.94 kg/m  Vitals:   02/02/17 1058  BP: 120/70  Pulse: 93  Resp: 16  Temp: 98.4 F (36.9 C)  TempSrc: Oral  SpO2: 97%  Weight: 164 lb (74.4 kg)     Physical Exam  Constitutional: She appears well-developed and well-nourished. No distress.  HENT:  Head: Normocephalic and atraumatic.  Right Ear: Hearing, tympanic membrane, external ear and ear canal normal.  Left Ear: Hearing, tympanic membrane, external ear and ear canal normal.  Nose: Mucosal edema present. No rhinorrhea. Right sinus exhibits maxillary sinus tenderness and  frontal sinus tenderness. Left sinus exhibits maxillary sinus tenderness and frontal sinus tenderness.  Mouth/Throat: Uvula is midline and mucous membranes are normal. Posterior oropharyngeal erythema present. No oropharyngeal exudate or posterior oropharyngeal edema.  Neck: Normal range of motion. Neck supple. No tracheal deviation present. No thyromegaly present.  Cardiovascular: Normal rate, regular rhythm and normal heart sounds. Exam reveals no gallop and no friction rub.  No murmur heard. Pulmonary/Chest: Effort normal and breath sounds normal. No stridor. No respiratory distress. She has no wheezes. She has no rales.  Lymphadenopathy:    She has no cervical adenopathy.  Skin: She is not diaphoretic.  Vitals reviewed.     Assessment & Plan:     1. Acute pansinusitis, recurrence not specified Worsening symptoms that have not responded to OTC medications. Will give  augmentin as below. Continue allergy medications. Stay well hydrated and get plenty of rest. Call if no symptom improvement or if symptoms worsen. - amoxicillin-clavulanate (AUGMENTIN) 875-125 MG tablet; Take 1 tablet 2 (two) times daily by mouth.  Dispense: 20 tablet; Refill: 0  2. Acne vulgaris Stable. Diagnosis pulled for medication refill. Continue current medical treatment plan. - benzoyl peroxide-erythromycin (BENZAMYCIN) gel; Apply 2 (two) times daily topically.  Dispense: 46.6 g; Refill: 5  3. Chronic conjunctivitis of right eye, unspecified chronic conjunctivitis type Stable. Diagnosis pulled for medication refill. Continue current medical treatment plan. - erythromycin ophthalmic ointment; Place 1 application 2 (two) times daily as needed into both eyes.  Dispense: 3.5 g; Refill: 5 - sulfacetamide (BLEPH-10) 10 % ophthalmic solution; Apply two drops to right eye 4 x day.  Dispense: 15 mL; Refill: 5  4. Flexural eczema Stable. Diagnosis pulled for medication refill. Continue current medical treatment plan. -  TRIAMCINOLONE ACETONIDE, TOP, 0.05 % OINT; Apply to rash as needed TID.  Dispense: 85 g; Refill: 5  5. Non-seasonal allergic rhinitis due to pollen Stable. Diagnosis pulled for medication refill. Continue current medical treatment plan. - Azelastine-Fluticasone 137-50 MCG/ACT SUSP; Place 1 spray 2 (two) times daily into the nose.  Dispense: 23 g; Refill: 3  6. Epilepsy, not refractory (HCC) Has f/u with neurology. Needs tegretol checked for f/u. Lab ordered as below.  - Carbamazepine Level (Tegretol), total       Joanna Loveless, PA-C  [facility]

## 2017-02-02 NOTE — Patient Instructions (Signed)

## 2017-02-09 ENCOUNTER — Telehealth (INDEPENDENT_AMBULATORY_CARE_PROVIDER_SITE_OTHER): Payer: Self-pay | Admitting: Family

## 2017-02-09 NOTE — Telephone Encounter (Signed)
Noted. I will see her when she is able to come in for  Visit. TG

## 2017-02-09 NOTE — Telephone Encounter (Signed)
  Who's calling (name and relationship to patient) : Patsy LagerYolanda, mother  Best contact number: [phone]  Provider they see: Inetta Fermoina  Reason for call: Mother called in to r/s tomorrow's appt(11.20.2018) due to [name] is still sick.  She stated she is taking Genna to her PCP tomorrow and will have them draw her Depakote Level and send here for you to review.     PRESCRIPTION REFILL ONLY  Name of prescription:  Pharmacy:

## 2017-02-10 ENCOUNTER — Ambulatory Visit (INDEPENDENT_AMBULATORY_CARE_PROVIDER_SITE_OTHER): Payer: Medicare Other | Admitting: Family

## 2017-02-10 DIAGNOSIS — G40909 Epilepsy, unspecified, not intractable, without status epilepticus: Secondary | ICD-10-CM | POA: Diagnosis not present

## 2017-02-11 ENCOUNTER — Telehealth: Payer: Self-pay

## 2017-02-11 ENCOUNTER — Telehealth (INDEPENDENT_AMBULATORY_CARE_PROVIDER_SITE_OTHER): Payer: Self-pay | Admitting: Neurology

## 2017-02-11 LAB — CARBAMAZEPINE LEVEL, TOTAL: CARBAMAZEPINE LVL: 15.8 mg/L — AB (ref 4.0–12.0)

## 2017-02-11 NOTE — Telephone Encounter (Signed)
Patient mom was advised. Labs was routed to [facility].  Thanks,  -Jedidiah Demartini

## 2017-02-11 NOTE — Telephone Encounter (Signed)
Mother called concerning the level of Trileptal which is 15.8 but patient has been doing okay and she has been on the same dose of medication for a long time.  Her previous Trileptal level over the past year was 15.3 and 15.6 So I told mother that since she is doing well, I do not think she needs any change in her medication.  I told mother that I will send the note to Elveria Risingina Goodpasture and if there is any adjustment needed, she will call her in the next couple of weeks.

## 2017-02-11 NOTE — Telephone Encounter (Signed)
-----   Message from Margaretann LovelessJennifer M Burnette, PA-C sent at 02/11/2017  8:35 AM EST ----- Tegretol level is at 15.8 currently. Cut off range for our lab is 12.0.

## 2017-02-16 ENCOUNTER — Telehealth: Payer: Self-pay | Admitting: Physician Assistant

## 2017-02-16 ENCOUNTER — Other Ambulatory Visit (INDEPENDENT_AMBULATORY_CARE_PROVIDER_SITE_OTHER): Payer: Self-pay | Admitting: Family

## 2017-02-16 DIAGNOSIS — E876 Hypokalemia: Secondary | ICD-10-CM

## 2017-02-16 MED ORDER — POTASSIUM CHLORIDE 20 MEQ PO PACK: PACK | ORAL | 11 refills | Status: DC

## 2017-02-16 NOTE — Telephone Encounter (Signed)
I called Mom and talked with her about the Tegretol level. She said that Joanna Robinson had been sick with strep infection and has been on antibiotics, but that she is not sleepy or staggering or otherwise has symptoms of toxicity. The level is similar to what she has had in the past so I recommended to Mom that she continue the medication without change for now and that we will recheck the level after she finishes antibiotics. TG

## 2017-02-16 NOTE — Telephone Encounter (Signed)
°  Who's calling (name and relationship to patient) : Joanna LagerYolanda (mom) Best contact number: [phone] Provider they see: Goodpasture  Reason for call: Mom called stated that patient's Trileptal levels were too high. Mom called to see if there any adjustment was needed.  Please call.     PRESCRIPTION REFILL ONLY  Name of prescription:  Pharmacy:

## 2017-02-16 NOTE — Telephone Encounter (Signed)
Refill sent.

## 2017-02-16 NOTE — Telephone Encounter (Signed)
[facility] faxed a request on the following medication. Thanks CC  potassium chloride (KLOR-CON) 20 MEQ packet

## 2017-02-17 ENCOUNTER — Telehealth (INDEPENDENT_AMBULATORY_CARE_PROVIDER_SITE_OTHER): Payer: Self-pay

## 2017-02-17 DIAGNOSIS — G40209 Localization-related (focal) (partial) symptomatic epilepsy and epileptic syndromes with complex partial seizures, not intractable, without status epilepticus: Secondary | ICD-10-CM

## 2017-02-17 MED ORDER — ACETAZOLAMIDE 250 MG PO TABS: ORAL_TABLET | ORAL | 5 refills | Status: DC

## 2017-02-17 NOTE — Telephone Encounter (Signed)
Rx has been faxed to the pharmacy 

## 2017-02-24 ENCOUNTER — Encounter (INDEPENDENT_AMBULATORY_CARE_PROVIDER_SITE_OTHER): Payer: Self-pay | Admitting: Family

## 2017-02-24 ENCOUNTER — Ambulatory Visit (INDEPENDENT_AMBULATORY_CARE_PROVIDER_SITE_OTHER): Payer: Medicare Other | Admitting: Family

## 2017-02-24 VITALS — BP 100/70 | HR 80 | Ht 68.0 in | Wt 163.8 lb

## 2017-02-24 DIAGNOSIS — Z79899 Other long term (current) drug therapy: Secondary | ICD-10-CM | POA: Diagnosis not present

## 2017-02-24 DIAGNOSIS — G40909 Epilepsy, unspecified, not intractable, without status epilepticus: Secondary | ICD-10-CM

## 2017-02-24 DIAGNOSIS — R7889 Finding of other specified substances, not normally found in blood: Secondary | ICD-10-CM

## 2017-02-24 NOTE — Patient Instructions (Addendum)
Thank you for coming in today.   Instructions for you until your next appointment are as follows: 1. We will recheck the drug level next week. I have given you paperwork for that. Be sure that Joanna Robinson does not take her medication that day until after the blood has been drawn. 2. I will call you when I receive the results.  3. Please plan to return for follow up in one year or sooner if needed. 4. Please sign up for MyChart if you have not done so.

## 2017-02-24 NOTE — Progress Notes (Signed)
Patient: Joanna Robinson MRN: [id number] Sex: female DOB: April 14, 1980  Provider: Elveria Risingina Calyse Murcia, NP [facility]  Note type: Routine return visit  History of Present Illness: Referral Source: Lorie PhenixNancy Maloney, MD [facility] and Mom Chief Complaint: Epilepsy, Problems with eyes  Joanna Robinson is a 36 y.o. woman with history of undifferentiated autism and complex partial seizures. She was last seen January 15, 2016. Joanna Robinson is taking and tolerating Carbatrol for her seizure disorder. Mom reports that Joanna Robinson has recently been sick, and treated with antibiotics for strep throat and upper respiratory infection. Labs were drawn by her PCP at that visit and her Carbamazepine level was determined to be 15.278mcg/ml on February 10, 2017. Because Joanna Robinson has had no symptoms of toxicity, I instructed Mom to continue her dose at that time until I evaluated her today.  She has had no increase in seizures during her recent illness. Joanna Robinson typically has a seizure during her menstrual period.   Joanna Robinson continues to have behavior that is entirely self-directed. Mom takes her out shopping and to medical appointments in a wheelchair because Joanna Robinson's motions are very slow and cannot be hurried. She is not safe in a public or traffic area without the wheelchair. Joanna Robinson has been going to a day program and has been doing well with that. She has been otherwise generally healthy and Mom has no other health concerns for her today other than previously mentioned.   Review of Systems: Please see the HPI for neurologic and other pertinent review of systems. Otherwise, all other systems were reviewed and were negative.    Past Medical History:  Diagnosis Date  . Autism   . Epilepsy (HCC)   . Hemorrhoid   . Hyperlipidemia   . Seizures (HCC)    Hospitalizations: No., Head Injury: No., Nervous System Infections: No., Immunizations up to date: Yes.   Past  Medical History Comments: See history   Surgical History Past Surgical History:  Procedure Laterality Date  . NO PAST SURGERIES      Family History family history includes Alzheimer's disease in her maternal grandmother and paternal grandfather. Family History is otherwise negative for migraines, seizures, cognitive impairment, blindness, deafness, birth defects, chromosomal disorder, autism.  Social History Social History   Socioeconomic History  . Marital status: Single    Spouse name: None  . Number of children: None  . Years of education: None  . Highest education level: None  Social Needs  . Financial resource strain: None  . Food insecurity - worry: None  . Food insecurity - inability: None  . Transportation needs - medical: None  . Transportation needs - non-medical: None  Occupational History  . None  Tobacco Use  . Smoking status: Never Smoker  . Smokeless tobacco: Never Used  Substance and Sexual Activity  . Alcohol use: No  . Drug use: No  . Sexual activity: No  Other Topics Concern  . 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 She has a nurse that comes to her home and they go out on field trips. She enjoys exercise, dance, and riding bike. She lives with her mother.    Allergies Allergies  Allergen Reactions  . Codeine   . Corticosteroids Nausea Only    Physical Exam BP 100/70   Pulse 80   Ht 5\' 8"  (1.727 m)   Wt 163 lb 12.8 oz (74.3 kg)   BMI 24.91 kg/m  General:  well developed, well nourished woman, seated in a wheelchair, in no evident distress; black hair, brown eyes, right handed Head: normocephalic and atraumatic. Oropharynx benign. No dysmorphic features. Neck: supple with no carotid bruits. No focal tenderness. Cardiovascular: regular rate and rhythm, no murmurs. Respiratory: Clear to auscultation bilaterally Abdomen: Bowel sounds present all four quadrants, abdomen soft, non-tender, non-distended. No  hepatosplenomegaly or masses palpated. Musculoskeletal: No skeletal deformities or obvious scoliosis Skin: no rashes or neurocutaneous lesions  Neurologic Exam Mental Status: Awake and fully alert.  Fund of knowledge subnormal for age.  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 She frequently flaps her hands and arms. She is able to follow some very simple commands and was fairly tolerant of examination. Cranial Nerves: Fundoscopic exam - red reflex present.  Unable to fully visualize fundus.  Pupils equal briskly reactive to light.  Extraocular movements and visual fields appear full but examination was limited by patient's inability to follow instructions. Turns to localize sounds in the periphery. Facial sensation intact.  Face, tongue, palate move normally and symmetrically.  Neck flexion and extension normal. Motor: Normal bulk and tone.  Normal functional strength in all tested extremity muscles. Sensory: Withdrawal x 4.  Coordination: Unable to adequately assess but balance seemed adequate. Gait and Station: Arises from chair, without difficulty. Stance is normal.  Gait demonstrates very slow but normal stride length and balance. Reflexes: Diminished and symmetric. Toes downgoing. No clonus.  Impression 1. Autism spectrum disorder 2. Complex partial seizures with secondary generalization 3. Elevated Carbamazepine level   Recommendations for plan of care The patient's previous [facility] records were reviewed. [name] has neither had nor required imaging or lab studies since the last visit other than what has been performed at her PCP office. She is taking and tolerating Carbatrol for her seizure disorder. Lab studies on February 10, 2017 revealed an elevated Carbamazepine level of 15.41mcg/ml. While Joanna Robinson has no symptoms of toxicity, I recommended to her mother that we repeat the level within the next week, and explained how to obtain a trough level. If the level is higher, we may  need to reduce the Carbatrol dose slightly. I will call Mom when I receive the results and discuss a treatment plan. I will otherwise see Joanna Robinson back in follow up in 1 year or sooner if needed. Mom agreed with the plans made today.   The medication list was reviewed and reconciled.  No changes were made in the prescribed medications today.  A complete medication list was provided to the patient's mother.  Allergies as of 02/24/2017      Reactions   Codeine    Corticosteroids Nausea Only      Medication List        Accurate as of 02/24/17 11:59 PM. Always use your most recent med list.          acetaZOLAMIDE 250 MG tablet Commonly known as:  DIAMOX Take 1 tab by mouth twice daily   amoxicillin-clavulanate 875-125 MG tablet Commonly known as:  AUGMENTIN Take 1 tablet 2 (two) times daily by mouth.   AVEENO CLEAR COMPLEXION BB Crea Apply as directed bid   Azelastine-Fluticasone 137-50 MCG/ACT Susp Place 1 spray 2 (two) times daily into the nose.   BEANO Tabs Take by mouth as needed (2-3 Tabs as needed prior to meal that may cause bloating.).   benzoyl peroxide-erythromycin gel Commonly known as:  BENZAMYCIN Apply 2 (two) times daily topically.   CARBATROL 200 MG 12 hr capsule  Generic drug:  carbamazepine Give Gerica 2 capsules in the morning and 1 capsule in the evening   CARBATROL 300 MG 12 hr capsule Generic drug:  carbamazepine Take 1 capsule at bedtime along with the Carbatrol 200mg  capsule   cloNIDine 0.2 MG tablet Commonly known as:  CATAPRES Take 1 tablet (0.2 mg total) by mouth at bedtime.   DELSYM COUGH RELIEF MT Use as directed in the mouth or throat as needed (2 Tsp. by mouth every 12 hours as needed for cough.). Reported on 04/07/2015   erythromycin ophthalmic ointment Place 1 application 2 (two) times daily as needed into both eyes.   fexofenadine 180 MG tablet Commonly known as:  ALLEGRA ALLERGY Take 1 tablet (180 mg total) by mouth daily.     fluvoxaMINE 100 MG tablet Commonly known as:  LUVOX Take 1 tablet (100 mg total) by mouth 2 (two) times daily.   GNP PAIN RELIEF EX-STRENGTH 500 MG tablet Generic drug:  acetaminophen TAKE 2 TABLETS BY MOUTH 3 TIMES DAILY ASNEEDED FOR HEADACHE   hydrochlorothiazide 25 MG tablet Commonly known as:  HYDRODIURIL Take 1 tablet (25 mg total) by mouth daily.   hydrocortisone 2.5 % rectal cream Commonly known as:  ANUSOL-HC Place 1 application rectally 2 (two) times daily.   LASIX 20 MG tablet Generic drug:  furosemide Take 1 tablet by mouth daily.   montelukast 10 MG tablet Commonly known as:  SINGULAIR Take 1 tablet (10 mg total) by mouth daily.   MYLANTA ULTIMATE STRENGTH 500-500 MG/5ML Susp Generic drug:  Aluminum & Magnesium Hydroxide MYLANTA ULTIMATE STRENGTH, 500-500MG /5ML (Oral Suspension)  1 TID as needed for 0 days  Quantity: 0.00;  Refills: 0   Ordered :14-Mar-2010  Denna HaggardAleksandrova, MA, Anastasiya ;  Started 19-October-2008 Active Comments: Medication taken as needed.   naproxen 500 MG tablet Commonly known as:  NAPROSYN TAKE 1 TABLET BY MOUTH TWICE DAILY FOR ENTIRE MENSTRUAL CYCLE.   omega-3 acid ethyl esters 1 g capsule Commonly known as:  LOVAZA Take 1 g by mouth daily.   polyethylene glycol powder powder Commonly known as:  GLYCOLAX/MIRALAX Take 17 g by mouth at bedtime.   potassium chloride 20 MEQ packet Commonly known as:  KLOR-CON DISSOLVE CONTENTS OF 1 PACKET IN AT LEAST 4 OZ OF WATER/JUICE AND DRINK ONCEDAILY   sodium fluoride 1.1 % Crea dental cream Commonly known as:  DENTA 5000 PLUS Place 1 application onto teeth every evening. Brush teeth daily for 1 minute, rinse and spit.   sulfacetamide 10 % ophthalmic solution Commonly known as:  BLEPH-10 Apply two drops to right eye 4 x day.   TRIAMCINOLONE ACETONIDE (TOP) 0.05 % Oint Apply to rash as needed TID.   TUCKS 50 % Pads Apply 50 % topically as needed (As needed for cleansing and hemorrhoid  discomfort.).       Dr. Sharene SkeansHickling was consulted regarding the patient.   Total time spent with the patient was 25 minutes, of which 50% or more was spent in counseling and coordination of care.   Elveria Risingina Lielle Vandervort NP-C

## 2017-02-28 ENCOUNTER — Encounter (INDEPENDENT_AMBULATORY_CARE_PROVIDER_SITE_OTHER): Payer: Self-pay | Admitting: Family

## 2017-03-04 ENCOUNTER — Telehealth (INDEPENDENT_AMBULATORY_CARE_PROVIDER_SITE_OTHER): Payer: Self-pay

## 2017-03-04 NOTE — Telephone Encounter (Signed)
Spoke with Mrs. Joanna Robinson and informed her that we faxed over the lab orders to [facility]. She stated that she really appreciates us faxing them over. She states that they are still stuck under a lot of snow and ice but she will get her there by the end of the week, if possible, or first thing Monday morning.

## 2017-03-04 NOTE — Telephone Encounter (Signed)
I looked through the chart and found the fax number for that practice. Please let Mom know that I have faxed a copy of the lab order to them. Thanks, Inetta Fermoina

## 2017-03-04 NOTE — Telephone Encounter (Signed)
Patient's mother, Patsy LagerYolanda, called stating that they have misplaced Raahi's lab orders and would like for us to fax them over to [facility]. I [facility] and no one answered so I assume the office is still closed

## 2017-03-11 ENCOUNTER — Other Ambulatory Visit: Payer: Self-pay | Admitting: Physician Assistant

## 2017-03-11 ENCOUNTER — Other Ambulatory Visit: Payer: Self-pay

## 2017-03-11 DIAGNOSIS — R7889 Finding of other specified substances, not normally found in blood: Secondary | ICD-10-CM

## 2017-03-12 LAB — CARBAMAZEPINE LEVEL, TOTAL: CARBAMAZEPINE LVL: 15.2 mg/L — AB (ref 4.0–12.0)

## 2017-03-20 ENCOUNTER — Other Ambulatory Visit: Payer: Self-pay | Admitting: Physician Assistant

## 2017-03-20 DIAGNOSIS — J302 Other seasonal allergic rhinitis: Secondary | ICD-10-CM

## 2017-03-20 MED ORDER — MONTELUKAST SODIUM 10 MG PO TABS: 10.0000 mg | ORAL_TABLET | Freq: Every day | ORAL | 3 refills | Status: DC

## 2017-03-20 NOTE — Telephone Encounter (Signed)
[facility] faxed a refill request for the following medication. Thanks CC  montelukast (SINGULAIR) 10 MG tablet

## 2017-03-20 NOTE — Telephone Encounter (Signed)
Please review. Thanks!  

## 2017-04-09 ENCOUNTER — Telehealth (INDEPENDENT_AMBULATORY_CARE_PROVIDER_SITE_OTHER): Payer: Self-pay | Admitting: Family

## 2017-04-09 ENCOUNTER — Telehealth: Payer: Self-pay | Admitting: Physician Assistant

## 2017-04-09 NOTE — Telephone Encounter (Signed)
°  Who's calling (name and relationship to patient) : Mom/Yoalnda  Best contact number: [phone]  Provider they see: Sula Sodaina G.  Reason for call: Mom called in requesting another letter from Provider in reference to pt's diagnosis in order for pt to be able to continue with her current services; also stated that pt is having migraines(headache in one eye) during her cycle. Mom would like a call back please.

## 2017-04-09 NOTE — Telephone Encounter (Signed)
Patients mother calling stating she needs a letter to be written for her daughter stating the medical condication's she has to keep her care giving available. Please return mother's call at [phone] for the fax # to fax this letter to. Thanks CC

## 2017-04-09 NOTE — Telephone Encounter (Signed)
Spoke with mom and she informed me that Inetta Fermoina writes a letter every 3-4 months. She needs the letter to specify that [name] will not change from having autism nor stop having seizures. She states that the headaches have gotten worse when her menstrual cycle starts. Mom states that when she has her menstrual cycle, she misses two days out of school a month. She states that her body becomes slow, she may have a seizure, and her migraine is in her right eye.

## 2017-04-10 ENCOUNTER — Encounter: Payer: Self-pay | Admitting: Physician Assistant

## 2017-04-10 ENCOUNTER — Telehealth (INDEPENDENT_AMBULATORY_CARE_PROVIDER_SITE_OTHER): Payer: Self-pay | Admitting: Family

## 2017-04-10 NOTE — Telephone Encounter (Signed)
Joanna Robinson and she gave me the fax #[phone]

## 2017-04-10 NOTE — Telephone Encounter (Signed)
°  Who's calling (name and relationship to patient) : Patsy LagerYolanda (mom) Best contact number: [phone] Provider they see: Elveria Risingina Goodpasture Reason for call: Mom stated that the fax she received from Inetta Fermoina was slightly difficult to make out and wants to know if Inetta Fermoina can mail the letter to the address below:   Sharion DoveYolanda Johnson [address] [place], [place] [id number]

## 2017-04-10 NOTE — Telephone Encounter (Signed)
Letter printed.

## 2017-04-10 NOTE — Telephone Encounter (Signed)
Letter has been placed upfront to be mailed to the address requested

## 2017-04-10 NOTE — Telephone Encounter (Signed)
I called and talked with Mom. She said that the agency is trying to reduce her caregiver hours and that is the reason for the letter. She also said that [name] was experiencing a migraine at the onset of each menstrual period. Mom said that her periods are very regular and she can predict when the cycle will start. I told her for the next menstrual period, on the day before she is to start, to give [name] an Aleve twice per day, then again the following day when the period starts. I asked her to let me know if that helps with diminishing the migraine. Mom asked for the letter to be faxed to her at [phone], which I will do. TG

## 2017-04-10 NOTE — Telephone Encounter (Signed)
I faxed the letter to Mom as requested. TG

## 2017-04-10 NOTE — Telephone Encounter (Signed)
Please review

## 2017-04-10 NOTE — Telephone Encounter (Signed)
Thank you for mailing the letter to Mom. TG

## 2017-05-16 ENCOUNTER — Other Ambulatory Visit: Payer: Self-pay | Admitting: Physician Assistant

## 2017-05-16 DIAGNOSIS — K5900 Constipation, unspecified: Secondary | ICD-10-CM

## 2017-05-19 ENCOUNTER — Other Ambulatory Visit (INDEPENDENT_AMBULATORY_CARE_PROVIDER_SITE_OTHER): Payer: Self-pay | Admitting: Family

## 2017-05-19 ENCOUNTER — Other Ambulatory Visit: Payer: Self-pay | Admitting: Physician Assistant

## 2017-05-19 DIAGNOSIS — G479 Sleep disorder, unspecified: Secondary | ICD-10-CM

## 2017-05-19 DIAGNOSIS — G40209 Localization-related (focal) (partial) symptomatic epilepsy and epileptic syndromes with complex partial seizures, not intractable, without status epilepticus: Secondary | ICD-10-CM

## 2017-06-16 ENCOUNTER — Other Ambulatory Visit: Payer: Self-pay | Admitting: Physician Assistant

## 2017-06-16 ENCOUNTER — Other Ambulatory Visit (INDEPENDENT_AMBULATORY_CARE_PROVIDER_SITE_OTHER): Payer: Self-pay | Admitting: Family

## 2017-06-16 DIAGNOSIS — R609 Edema, unspecified: Secondary | ICD-10-CM

## 2017-06-16 DIAGNOSIS — G40209 Localization-related (focal) (partial) symptomatic epilepsy and epileptic syndromes with complex partial seizures, not intractable, without status epilepticus: Secondary | ICD-10-CM

## 2017-07-14 ENCOUNTER — Ambulatory Visit: Payer: Medicare Other | Admitting: Physician Assistant

## 2017-07-17 ENCOUNTER — Ambulatory Visit (INDEPENDENT_AMBULATORY_CARE_PROVIDER_SITE_OTHER): Payer: Medicare Other | Admitting: Physician Assistant

## 2017-07-17 ENCOUNTER — Other Ambulatory Visit: Payer: Self-pay | Admitting: Physician Assistant

## 2017-07-17 ENCOUNTER — Encounter: Payer: Self-pay | Admitting: Physician Assistant

## 2017-07-17 VITALS — BP 122/60 | Temp 98.8°F | Resp 16

## 2017-07-17 DIAGNOSIS — G40909 Epilepsy, unspecified, not intractable, without status epilepticus: Secondary | ICD-10-CM

## 2017-07-17 DIAGNOSIS — E7800 Pure hypercholesterolemia, unspecified: Secondary | ICD-10-CM

## 2017-07-17 DIAGNOSIS — Z111 Encounter for screening for respiratory tuberculosis: Secondary | ICD-10-CM | POA: Diagnosis not present

## 2017-07-17 DIAGNOSIS — T148XXA Other injury of unspecified body region, initial encounter: Secondary | ICD-10-CM | POA: Diagnosis not present

## 2017-07-17 NOTE — Progress Notes (Signed)
Patient: Joanna SaaDanielle L Robinson Female    DOB: [birth year]   37 y.o.   MRN: [id number] Visit Date: 07/17/2017  Today's Provider: Margaretann LovelessJennifer M , PA-C   Chief Complaint  Patient presents with  . Bleeding/Bruising   Subjective:    HPI Patient comes in c/o easy bruising. He mother reports that she is playing basketball now and she has noticed bruises all over her body. She reports that in the past she was told that when she has bruises, that her WBCs were low.   She also has a form to be completed for her to participate in a daily "work" program.     Allergies  Allergen Reactions  . Codeine   . Corticosteroids Nausea Only     Current Outpatient Medications:  .  acetaZOLAMIDE (DIAMOX) 250 MG tablet, TAKE 1 TABLET BY MOUTH TWICE DAILY, Disp: 60 tablet, Rfl: 5 .  Alpha-D-Galactosidase (BEANO) TABS, Take by mouth as needed (2-3 Tabs as needed prior to meal that may cause bloating.)., Disp: , Rfl:  .  Aluminum & Magnesium Hydroxide (MYLANTA ULTIMATE STRENGTH) 500-500 MG/5ML SUSP, MYLANTA ULTIMATE STRENGTH, 500-500MG /5ML (Oral Suspension)  1 TID as needed for 0 days  Quantity: 0.00;  Refills: 0   Ordered :14-Mar-2010  Denna HaggardAleksandrova, MA, Anastasiya ;  Started 19-October-2008 Active Comments: Medication taken as needed. , Disp: , Rfl:  .  Azelastine-Fluticasone 137-50 MCG/ACT SUSP, Place 1 spray 2 (two) times daily into the nose., Disp: 23 g, Rfl: 3 .  benzoyl peroxide-erythromycin (BENZAMYCIN) gel, Apply 2 (two) times daily topically., Disp: 46.6 g, Rfl: 5 .  CARBATROL 200 MG 12 hr capsule, GIVE Joeline 2 CAPSULES IN THE MORNING AND 1 CAPSULE IN THE EVENING, Disp: 90 capsule, Rfl: 5 .  CARBATROL 300 MG 12 hr capsule, TAKE 1 CAPSULE BY MOUTH AT BEDTIME ALONGWITH CARBATROL 200MG  CAPSULE, Disp: 30 capsule, Rfl: 5 .  cloNIDine (CATAPRES) 0.2 MG tablet, TAKE 1 TABLET BY MOUTH AT BEDTIME, Disp: 90 tablet, Rfl: 1 .  Dextromethorphan-Menthol (DELSYM COUGH RELIEF MT), Use as directed in the mouth  or throat as needed (2 Tsp. by mouth every 12 hours as needed for cough.). Reported on 04/07/2015, Disp: , Rfl:  .  erythromycin ophthalmic ointment, Place 1 application 2 (two) times daily as needed into both eyes., Disp: 3.5 g, Rfl: 5 .  fexofenadine (ALLEGRA ALLERGY) 180 MG tablet, Take 1 tablet (180 mg total) by mouth daily., Disp: 90 tablet, Rfl: 3 .  fluvoxaMINE (LUVOX) 100 MG tablet, TAKE 1 TABLET BY MOUTH TWICE DAILY, Disp: 180 tablet, Rfl: 1 .  furosemide (LASIX) 20 MG tablet, Take 1 tablet by mouth daily., Disp: , Rfl:  .  GAVILAX powder, MIX 17 GRAMS IN 4 TO 8 OZ OF FLUID AND DRINK AT BEDTIME, Disp: 527 g, Rfl: 3 .  GNP PAIN RELIEF EX-STRENGTH 500 MG tablet, TAKE 2 TABLETS BY MOUTH 3 TIMES DAILY ASNEEDED FOR HEADACHE, Disp: 60 tablet, Rfl: 5 .  hydrochlorothiazide (HYDRODIURIL) 25 MG tablet, TAKE 1 TABLET BY MOUTH ONCE DAILY, Disp: 90 tablet, Rfl: 1 .  hydrocortisone (ANUSOL-HC) 2.5 % rectal cream, Place 1 application rectally 2 (two) times daily., Disp: , Rfl:  .  montelukast (SINGULAIR) 10 MG tablet, Take 1 tablet (10 mg total) by mouth daily., Disp: 90 tablet, Rfl: 3 .  naproxen (NAPROSYN) 500 MG tablet, TAKE 1 TABLET BY MOUTH TWICE DAILY FOR ENTIRE MENSTRUAL CYCLE., Disp: 20 tablet, Rfl: 5 .  omega-3 acid ethyl esters (LOVAZA) 1 G  capsule, Take 1 g by mouth daily., Disp: , Rfl:  .  potassium chloride (KLOR-CON) 20 MEQ packet, DISSOLVE CONTENTS OF 1 PACKET IN AT LEAST 4 OZ OF WATER/JUICE AND DRINK ONCEDAILY, Disp: 30 packet, Rfl: 11 .  sodium fluoride (DENTA 5000 PLUS) 1.1 % CREA dental cream, Place 1 application onto teeth every evening. Brush teeth daily for 1 minute, rinse and spit., Disp: 1 Tube, Rfl: 5 .  sulfacetamide (BLEPH-10) 10 % ophthalmic solution, Apply two drops to right eye 4 x day., Disp: 15 mL, Rfl: 5 .  Sunscreens (AVEENO CLEAR COMPLEXION BB) CREA, Apply as directed bid, Disp: 75 mL, Rfl: 11 .  TRIAMCINOLONE ACETONIDE, TOP, 0.05 % OINT, Apply to rash as needed TID.,  Disp: 85 g, Rfl: 5 .  Witch Hazel (TUCKS) 50 % PADS, Apply 50 % topically as needed (As needed for cleansing and hemorrhoid discomfort.)., Disp: , Rfl:  .  amoxicillin-clavulanate (AUGMENTIN) 875-125 MG tablet, Take 1 tablet 2 (two) times daily by mouth. (Patient not taking: Reported on 07/17/2017), Disp: 20 tablet, Rfl: 0  Review of Systems  Constitutional: Negative.   Cardiovascular: Negative for chest pain, palpitations and leg swelling.  Musculoskeletal: Negative.   Skin: Negative for color change, pallor, rash and wound.  Neurological: Negative for dizziness and headaches.  Hematological: Bruises/bleeds easily.  Psychiatric/Behavioral: Negative.     Social History   Tobacco Use  . Smoking status: Never Smoker  . Smokeless tobacco: Never Used  Substance Use Topics  . Alcohol use: No   Objective:   BP 122/60 (BP Location: Right Arm, Patient Position: Sitting, Cuff Size: Normal)   Temp 98.8 F (37.1 C)   Resp 16  Vitals:   07/17/17 0858  BP: 122/60  Resp: 16  Temp: 98.8 F (37.1 C)     Physical Exam  Constitutional: She appears well-developed and well-nourished. No distress.  Neck: Normal range of motion. Neck supple. No JVD present. No tracheal deviation present. No thyromegaly present.  Cardiovascular: Normal rate, regular rhythm and normal heart sounds. Exam reveals no gallop and no friction rub.  No murmur heard. Pulmonary/Chest: Effort normal and breath sounds normal. No respiratory distress. She has no wheezes. She has no rales.  Lymphadenopathy:    She has no cervical adenopathy.  Skin: She is not diaphoretic.  No active bruises today  Vitals reviewed.       Assessment & Plan:     1. Bruising H/O low WBC count. Will check labs as below and f/u pending results. - CBC w/Diff/Platelet - Comprehensive Metabolic Panel (CMET)  2. Seizure disorder [facility]) Time to check tegretol levels for her neurologist. Will check labs as below and f/u pending results. -  Carbamazepine Level (Tegretol), total  3. Hypercholesterolemia H/O this. Will check labs as below and f/u pending results. - Lipid Profile  4. Screening-pulmonary TB For day work program.  - Quantiferon tb gold assay       Margaretann Loveless, PA-C  [facility]

## 2017-07-18 ENCOUNTER — Other Ambulatory Visit (INDEPENDENT_AMBULATORY_CARE_PROVIDER_SITE_OTHER): Payer: Self-pay | Admitting: Family

## 2017-07-18 DIAGNOSIS — G40209 Localization-related (focal) (partial) symptomatic epilepsy and epileptic syndromes with complex partial seizures, not intractable, without status epilepticus: Secondary | ICD-10-CM

## 2017-07-18 LAB — COMPREHENSIVE METABOLIC PANEL
ALBUMIN: 3.9 g/dL (ref 3.5–5.5)
ALT: 9 IU/L (ref 0–32)
AST: 14 IU/L (ref 0–40)
Albumin/Globulin Ratio: 1.3 (ref 1.2–2.2)
Alkaline Phosphatase: 72 IU/L (ref 39–117)
BUN/Creatinine Ratio: 22 (ref 9–23)
BUN: 16 mg/dL (ref 6–20)
CHLORIDE: 110 mmol/L — AB (ref 96–106)
CO2: 19 mmol/L — AB (ref 20–29)
CREATININE: 0.74 mg/dL (ref 0.57–1.00)
Calcium: 9 mg/dL (ref 8.7–10.2)
GFR calc non Af Amer: 106 mL/min/{1.73_m2} (ref >59)
GFR, EST AFRICAN AMERICAN: 122 mL/min/{1.73_m2} (ref >59)
GLUCOSE: 82 mg/dL (ref 65–99)
Globulin, Total: 3 g/dL (ref 1.5–4.5)
Potassium: 3.5 mmol/L (ref 3.5–5.2)
Sodium: 141 mmol/L (ref 134–144)
TOTAL PROTEIN: 6.9 g/dL (ref 6.0–8.5)

## 2017-07-18 LAB — CBC WITH DIFFERENTIAL/PLATELET
BASOS ABS: 0 10*3/uL (ref 0.0–0.2)
Basos: 0 %
EOS (ABSOLUTE): 0.1 10*3/uL (ref 0.0–0.4)
Eos: 4 %
Hematocrit: 38.5 % (ref 34.0–46.6)
Hemoglobin: 12.8 g/dL (ref 11.1–15.9)
IMMATURE GRANS (ABS): 0 10*3/uL (ref 0.0–0.1)
IMMATURE GRANULOCYTES: 0 %
LYMPHS: 26 %
Lymphocytes Absolute: 0.9 10*3/uL (ref 0.7–3.1)
MCH: 29 pg (ref 26.6–33.0)
MCHC: 33.2 g/dL (ref 31.5–35.7)
MCV: 87 fL (ref 79–97)
Monocytes Absolute: 0.4 10*3/uL (ref 0.1–0.9)
Monocytes: 13 %
NEUTROS ABS: 1.8 10*3/uL (ref 1.4–7.0)
NEUTROS PCT: 57 %
PLATELETS: 125 10*3/uL — AB (ref 150–379)
RBC: 4.42 x10E6/uL (ref 3.77–5.28)
RDW: 14.1 % (ref 12.3–15.4)
WBC: 3.3 10*3/uL — AB (ref 3.4–10.8)

## 2017-07-18 LAB — LIPID PANEL
CHOLESTEROL TOTAL: 223 mg/dL — AB (ref 100–199)
Chol/HDL Ratio: 2.7 ratio (ref 0.0–4.4)
HDL: 84 mg/dL (ref >39)
LDL CALC: 126 mg/dL — AB (ref 0–99)
Triglycerides: 64 mg/dL (ref 0–149)
VLDL CHOLESTEROL CAL: 13 mg/dL (ref 5–40)

## 2017-07-18 LAB — CARBAMAZEPINE LEVEL, TOTAL: CARBAMAZEPINE LVL: 13.6 ug/mL — AB (ref 4.0–12.0)

## 2017-07-22 ENCOUNTER — Telehealth (INDEPENDENT_AMBULATORY_CARE_PROVIDER_SITE_OTHER): Payer: Self-pay | Admitting: Family

## 2017-07-22 ENCOUNTER — Telehealth: Payer: Self-pay | Admitting: Physician Assistant

## 2017-07-22 LAB — QUANTIFERON-TB GOLD PLUS
QUANTIFERON TB2 AG VALUE: 0.43 [IU]/mL
QUANTIFERON-TB GOLD PLUS: NEGATIVE
QuantiFERON Mitogen Value: 6.21 IU/mL
QuantiFERON Nil Value: 0.79 IU/mL
QuantiFERON TB1 Ag Value: 0.35 IU/mL

## 2017-07-22 NOTE — Telephone Encounter (Signed)
Patient advised as below. Patient verbalizes understanding and is in agreement with treatment plan.  

## 2017-07-22 NOTE — Telephone Encounter (Signed)
Pt's mother has question regarding her lab results.  Mother want to make sure her daughter isn't in danger with her lab levels.  States she isn't able to get back into the Neurologist until a month b/c the provider is out of town.

## 2017-07-22 NOTE — Telephone Encounter (Signed)
She is not. I heard from a neurologist today from the practice that reviewed and wants to continue current regimen and recheck her CBC in 4 weeks.

## 2017-07-22 NOTE — Telephone Encounter (Signed)
I reviewed the note from [facility] and talk with [name] by phone.  Apparently Joanna Robinson has some bruising on her legs but has been playing basketball.  The only time she is on a nonsteroidal is when she takes naproxen for her menstrual period pain.  I would repeat the CBC in a month unless bruising becomes more prominent or spontaneous.  She has been on Carbatrol long time.  It would be very unusual to suddenly start showing toxicity from the bone marrow due to Carbatrol.

## 2017-07-22 NOTE — Telephone Encounter (Signed)
°  Who's calling (name and relationship to patient) : [name] (Mother) Best contact number: [phone] Provider they see: Inetta Fermo Reason for call: Mom lvm stating that she would like for Inetta Fermo to call [facility]. Per mom, pt's platelets are low and wanted Inetta Fermo to contact the PCP. I lvm on mom's phone stating that Inetta Fermo will be back in the office next week.

## 2017-07-23 ENCOUNTER — Telehealth: Payer: Self-pay

## 2017-07-23 NOTE — Telephone Encounter (Signed)
Mrs. Laural Benes advised as below.

## 2017-07-23 NOTE — Telephone Encounter (Signed)
-----   Message from Margaretann Loveless, [place] sent at 07/22/2017  5:02 PM EDT ----- Quantiferon gold is negative. Please print result. I have a paper completed it has to go with. Mom can come pick up paper work at her convenience.

## 2017-08-13 ENCOUNTER — Ambulatory Visit: Payer: Medicare Other | Admitting: Physician Assistant

## 2017-08-14 ENCOUNTER — Encounter: Payer: Self-pay | Admitting: Physician Assistant

## 2017-08-14 ENCOUNTER — Ambulatory Visit (INDEPENDENT_AMBULATORY_CARE_PROVIDER_SITE_OTHER): Payer: Medicare Other | Admitting: Physician Assistant

## 2017-08-14 VITALS — BP 100/60 | HR 48 | Temp 98.5°F | Resp 16 | Wt 166.0 lb

## 2017-08-14 DIAGNOSIS — J301 Allergic rhinitis due to pollen: Secondary | ICD-10-CM | POA: Diagnosis not present

## 2017-08-14 DIAGNOSIS — T50905A Adverse effect of unspecified drugs, medicaments and biological substances, initial encounter: Secondary | ICD-10-CM | POA: Diagnosis not present

## 2017-08-14 DIAGNOSIS — D6959 Other secondary thrombocytopenia: Secondary | ICD-10-CM | POA: Diagnosis not present

## 2017-08-14 DIAGNOSIS — R7889 Finding of other specified substances, not normally found in blood: Secondary | ICD-10-CM | POA: Diagnosis not present

## 2017-08-14 NOTE — Patient Instructions (Signed)
Allergies An allergy is when your body reacts to a substance in a way that is not normal. An allergic reaction can happen after you:  Eat something.  Breathe in something.  Touch something.  You can be allergic to:  Things that are only around during certain seasons, like molds and pollens.  Foods.  Drugs.  Insects.  Animal dander.  What are the signs or symptoms?  Puffiness (swelling). This may happen on the lips, face, tongue, mouth, or throat.  Sneezing.  Coughing.  Breathing loudly (wheezing).  Stuffy nose.  Tingling in the mouth.  A rash.  Itching.  Itchy, red, puffy areas of skin (hives).  Watery eyes.  Throwing up (vomiting).  Watery poop (diarrhea).  Dizziness.  Feeling faint or fainting.  Trouble breathing or swallowing.  A tight feeling in the chest.  A fast heartbeat. How is this diagnosed? Allergies can be diagnosed with:  A medical and family history.  Skin tests.  Blood tests.  A food diary. A food diary is a record of all the foods, drinks, and symptoms you have each day.  The results of an elimination diet. This diet involves making sure not to eat certain foods and then seeing what happens when you start eating them again.  How is this treated? There is no cure for allergies, but allergic reactions can be treated with medicine. Severe reactions usually need to be treated at a hospital. How is this prevented? The best way to prevent an allergic reaction is to avoid the thing you are allergic to. Allergy shots and medicines can also help prevent reactions in some cases. This information is not intended to replace advice given to you by your health care provider. Make sure you discuss any questions you have with your health care provider. Document Released: 07/05/2012 Document Revised: 11/05/2015 Document Reviewed: 12/20/2013 Elsevier Interactive Patient Education  2018 Elsevier Inc.  

## 2017-08-14 NOTE — Progress Notes (Signed)
Patient: Joanna Robinson Female    DOB: 08/16/1980   37 y.o.   MRN: [id number] Visit Date: 08/14/2017  Today's Provider: Margaretann Loveless, PA-C   Chief Complaint  Patient presents with  . Follow-up   Subjective:    HPI Patient here today with care giver Joanna Robinson she reports that Joanna Robinson is concerned about Joanna Robinson's eyes being pink and nasal congestion and cough.   She is also needing to f/u her low platelet count.     Allergies  Allergen Reactions  . Codeine   . Corticosteroids Nausea Only     Current Outpatient Medications:  .  acetaZOLAMIDE (DIAMOX) 250 MG tablet, TAKE 1 TABLET BY MOUTH TWICE DAILY, Disp: 60 tablet, Rfl: 5 .  Alpha-D-Galactosidase (BEANO) TABS, Take by mouth as needed (2-3 Tabs as needed prior to meal that may cause bloating.)., Disp: , Rfl:  .  Aluminum & Magnesium Hydroxide (MYLANTA ULTIMATE STRENGTH) 500-500 MG/5ML SUSP, MYLANTA ULTIMATE STRENGTH, 500-500MG /5ML (Oral Suspension)  1 TID as needed for 0 days  Quantity: 0.00;  Refills: 0   Ordered :14-Mar-2010  Denna Haggard, MA, Anastasiya ;  Started 19-October-2008 Active Comments: Medication taken as needed. , Disp: , Rfl:  .  Azelastine-Fluticasone 137-50 MCG/ACT SUSP, Place 1 spray 2 (two) times daily into the nose., Disp: 23 g, Rfl: 3 .  benzoyl peroxide-erythromycin (BENZAMYCIN) gel, Apply 2 (two) times daily topically., Disp: 46.6 g, Rfl: 5 .  CARBATROL 200 MG 12 hr capsule, GIVE Kegan 2 CAPSULES IN THE MORNING AND 1 CAPSULE IN THE EVENING, Disp: 90 capsule, Rfl: 5 .  CARBATROL 300 MG 12 hr capsule, TAKE 1 CAPSULE BY MOUTH AT BEDTIME ALONGWITH CARBATROL  CAPSULE, Disp: 30 capsule, Rfl: 5 .  cloNIDine (CATAPRES) 0.2 MG tablet, TAKE 1 TABLET BY MOUTH AT BEDTIME, Disp: 90 tablet, Rfl: 1 .  fexofenadine (ALLEGRA ALLERGY) 180 MG tablet, Take 1 tablet (180 mg total) by mouth daily., Disp: 90 tablet, Rfl: 3 .  fluvoxaMINE (LUVOX) 100 MG tablet, TAKE 1 TABLET BY MOUTH TWICE DAILY,  Disp: 180 tablet, Rfl: 1 .  furosemide (LASIX) 20 MG tablet, Take 1 tablet by mouth daily., Disp: , Rfl:  .  GAVILAX powder, MIX 17 GRAMS IN 4 TO 8 OZ OF FLUID AND DRINK AT BEDTIME, Disp: 527 g, Rfl: 3 .  GNP PAIN RELIEF EX-STRENGTH 500 MG tablet, TAKE 2 TABLETS BY MOUTH 3 TIMES DAILY ASNEEDED FOR HEADACHE, Disp: 60 tablet, Rfl: 5 .  hydrochlorothiazide (HYDRODIURIL) 25 MG tablet, TAKE 1 TABLET BY MOUTH ONCE DAILY, Disp: 90 tablet, Rfl: 1 .  hydrocortisone (ANUSOL-HC) 2.5 % rectal cream, Place 1 application rectally 2 (two) times daily., Disp: , Rfl:  .  montelukast (SINGULAIR) 10 MG tablet, Take 1 tablet (10 mg total) by mouth daily., Disp: 90 tablet, Rfl: 3 .  naproxen (NAPROSYN) 500 MG tablet, TAKE 1 TABLET BY MOUTH TWICE DAILY FOR ENTIRE MENSTRUAL CYCLE., Disp: 20 tablet, Rfl: 5 .  omega-3 acid ethyl esters (LOVAZA) 1 G capsule, Take 1 g by mouth daily., Disp: , Rfl:  .  potassium chloride (KLOR-CON) 20 MEQ packet, DISSOLVE CONTENTS OF 1 PACKET IN AT LEAST 4 OZ OF WATER/JUICE AND DRINK ONCEDAILY, Disp: 30 packet, Rfl: 11 .  sodium fluoride (DENTA 5000 PLUS) 1.1 % CREA dental cream, Place 1 application onto teeth every evening. Brush teeth daily for 1 minute, rinse and spit., Disp: 1 Tube, Rfl: 5 .  sulfacetamide (BLEPH-10) 10 % ophthalmic solution, Apply  two drops to right eye 4 x day., Disp: 15 mL, Rfl: 5 .  Sunscreens (AVEENO CLEAR COMPLEXION BB) CREA, Apply as directed bid, Disp: 75 mL, Rfl: 11 .  TRIAMCINOLONE ACETONIDE, TOP, 0.05 % OINT, Apply to rash as needed TID., Disp: 85 g, Rfl: 5 .  Witch Hazel (TUCKS) 50 % PADS, Apply 50 % topically as needed (As needed for cleansing and hemorrhoid discomfort.)., Disp: , Rfl:  .  Dextromethorphan-Menthol (DELSYM COUGH RELIEF MT), Use as directed in the mouth or throat as needed (2 Tsp. by mouth every 12 hours as needed for cough.). Reported on 04/07/2015, Disp: , Rfl:   Review of Systems  Constitutional: Negative.   HENT: Positive for congestion.  Negative for ear pain, postnasal drip, rhinorrhea, sinus pressure, sinus pain, sneezing, sore throat and tinnitus.   Eyes: Positive for redness. Negative for photophobia, pain, itching and visual disturbance.  Respiratory: Positive for cough.   Cardiovascular: Negative.     Social History   Tobacco Use  . Smoking status: Never Smoker  . Smokeless tobacco: Never Used  Substance Use Topics  . Alcohol use: No   Objective:   BP 100/60 (BP Location: Left Arm, Patient Position: Sitting, Cuff Size: Normal)   Pulse (!) 48   Temp 98.5 F (36.9 C)   Resp 16   Wt 166 lb (75.3 kg)   SpO2 99%   BMI 25.24 kg/m  Vitals:   08/14/17 1137  BP: 100/60  Pulse: (!) 48  Resp: 16  Temp: 98.5 F (36.9 C)  SpO2: 99%  Weight: 166 lb (75.3 kg)     Physical Exam  Constitutional: She appears well-developed and well-nourished. No distress.  HENT:  Head: Normocephalic and atraumatic.  Right Ear: Hearing, tympanic membrane, external ear and ear canal normal.  Left Ear: Hearing, tympanic membrane, external ear and ear canal normal.  Nose: Nose normal.  Mouth/Throat: Uvula is midline, oropharynx is clear and moist and mucous membranes are normal. No oropharyngeal exudate.  Eyes: Pupils are equal, round, and reactive to light. Conjunctivae are normal. Right eye exhibits no discharge. Left eye exhibits no discharge. No scleral icterus.  Neck: Normal range of motion. Neck supple. No tracheal deviation present. No thyromegaly present.  Cardiovascular: Normal rate, regular rhythm and normal heart sounds. Exam reveals no gallop and no friction rub.  No murmur heard. Pulmonary/Chest: Effort normal and breath sounds normal. No stridor. No respiratory distress. She has no wheezes. She has no rales.  Lymphadenopathy:    She has no cervical adenopathy.  Skin: Skin is warm and dry. She is not diaphoretic.  Vitals reviewed.       Assessment & Plan:     1. Allergic rhinitis due to pollen, unspecified  seasonality Suspect recurrent allergies. Continue all current allergy regimen.   2. Drug-induced low platelet count Suspected from tegretol. Recheck labs as below.  - CBC w/Diff/Platelet  3. Elevated carbamazepine level H/O this. Has been having dose adjusted by Dr. [name]. Will check labs as below and f/u pending results. FYI: patient did not take tegretol this morning.  - Carbamazepine Level (Tegretol), total       Margaretann Loveless, PA-C  [facility]

## 2017-08-15 LAB — CBC WITH DIFFERENTIAL/PLATELET
Basophils Absolute: 0 10*3/uL (ref 0.0–0.2)
Basos: 1 %
EOS (ABSOLUTE): 0.1 10*3/uL (ref 0.0–0.4)
EOS: 2 %
HEMATOCRIT: 38.5 % (ref 34.0–46.6)
Hemoglobin: 12.5 g/dL (ref 11.1–15.9)
Immature Grans (Abs): 0 10*3/uL (ref 0.0–0.1)
Immature Granulocytes: 0 %
LYMPHS ABS: 1 10*3/uL (ref 0.7–3.1)
Lymphs: 33 %
MCH: 29.1 pg (ref 26.6–33.0)
MCHC: 32.5 g/dL (ref 31.5–35.7)
MCV: 90 fL (ref 79–97)
MONOS ABS: 0.4 10*3/uL (ref 0.1–0.9)
Monocytes: 12 %
NEUTROS ABS: 1.6 10*3/uL (ref 1.4–7.0)
Neutrophils: 52 %
Platelets: 181 10*3/uL (ref 150–450)
RBC: 4.3 x10E6/uL (ref 3.77–5.28)
RDW: 13.9 % (ref 12.3–15.4)
WBC: 3 10*3/uL — ABNORMAL LOW (ref 3.4–10.8)

## 2017-08-15 LAB — CARBAMAZEPINE LEVEL, TOTAL: Carbamazepine (Tegretol), S: 13.9 ug/mL (ref 4.0–12.0)

## 2017-08-17 ENCOUNTER — Other Ambulatory Visit: Payer: Self-pay | Admitting: Physician Assistant

## 2017-08-17 DIAGNOSIS — R519 Headache, unspecified: Secondary | ICD-10-CM

## 2017-08-17 DIAGNOSIS — R51 Headache: Principal | ICD-10-CM

## 2017-08-18 ENCOUNTER — Telehealth: Payer: Self-pay

## 2017-08-18 NOTE — Telephone Encounter (Signed)
-----   Message from Margaretann Loveless, [place] sent at 08/18/2017  9:09 AM EDT ----- Platelets are better and tegretol level is still slightly elevated at 13.9. Was 13.6 previously. Please notify [name] as well as her neurologist. Thanks.

## 2017-08-18 NOTE — Telephone Encounter (Signed)
Patient's mother Lendon Colonel advised as below. Faxed lab results to Elveria Rising, NP.

## 2017-08-25 ENCOUNTER — Telehealth (INDEPENDENT_AMBULATORY_CARE_PROVIDER_SITE_OTHER): Payer: Self-pay | Admitting: Family

## 2017-08-25 NOTE — Telephone Encounter (Signed)
I called Mom about Joanna Robinson's recent lab results. The Carbamazepine level is elevated at 13.9 but it is in line with her previous levels. She is not experiencing side effects. I instructed Mom to continue the Carbamazepine at the same dose. TG

## 2017-09-21 ENCOUNTER — Encounter (INDEPENDENT_AMBULATORY_CARE_PROVIDER_SITE_OTHER): Payer: Self-pay | Admitting: Family

## 2017-09-29 ENCOUNTER — Other Ambulatory Visit: Payer: Self-pay | Admitting: Physician Assistant

## 2017-09-29 DIAGNOSIS — K5900 Constipation, unspecified: Secondary | ICD-10-CM

## 2017-10-15 ENCOUNTER — Other Ambulatory Visit: Payer: Self-pay | Admitting: Physician Assistant

## 2017-10-15 DIAGNOSIS — J302 Other seasonal allergic rhinitis: Secondary | ICD-10-CM

## 2017-10-15 NOTE — Telephone Encounter (Signed)
[facility] faxed a refill request for the following medication. Thanks CC  fexofenadine (ALLEGRA ALLERGY) 180 MG tablet

## 2017-10-16 MED ORDER — FEXOFENADINE HCL 180 MG PO TABS: 180.0000 mg | ORAL_TABLET | Freq: Every day | ORAL | 3 refills | Status: DC

## 2017-10-27 ENCOUNTER — Other Ambulatory Visit: Payer: Self-pay | Admitting: Physician Assistant

## 2017-10-28 ENCOUNTER — Encounter: Payer: Self-pay | Admitting: Physician Assistant

## 2017-10-28 ENCOUNTER — Ambulatory Visit (INDEPENDENT_AMBULATORY_CARE_PROVIDER_SITE_OTHER): Payer: Medicare Other | Admitting: Physician Assistant

## 2017-10-28 VITALS — BP 110/60 | HR 65 | Temp 98.4°F | Resp 16 | Wt 175.0 lb

## 2017-10-28 DIAGNOSIS — F840 Autistic disorder: Secondary | ICD-10-CM

## 2017-10-28 DIAGNOSIS — G40909 Epilepsy, unspecified, not intractable, without status epilepticus: Secondary | ICD-10-CM

## 2017-10-28 DIAGNOSIS — R7889 Finding of other specified substances, not normally found in blood: Secondary | ICD-10-CM

## 2017-10-28 DIAGNOSIS — H10401 Unspecified chronic conjunctivitis, right eye: Secondary | ICD-10-CM | POA: Diagnosis not present

## 2017-10-28 DIAGNOSIS — E7800 Pure hypercholesterolemia, unspecified: Secondary | ICD-10-CM

## 2017-10-28 DIAGNOSIS — Z79899 Other long term (current) drug therapy: Secondary | ICD-10-CM

## 2017-10-28 DIAGNOSIS — E876 Hypokalemia: Secondary | ICD-10-CM | POA: Diagnosis not present

## 2017-10-28 DIAGNOSIS — N946 Dysmenorrhea, unspecified: Secondary | ICD-10-CM

## 2017-10-28 MED ORDER — OMEGA-3-ACID ETHYL ESTERS 1 G PO CAPS: 1.0000 g | ORAL_CAPSULE | Freq: Every day | ORAL | 1 refills | Status: DC

## 2017-10-28 MED ORDER — NAPROXEN 500 MG PO TABS: ORAL_TABLET | ORAL | 1 refills | Status: DC

## 2017-10-28 MED ORDER — SULFACETAMIDE SODIUM 10 % OP SOLN
OPHTHALMIC | 5 refills | Status: DC
Start: 2017-10-28 — End: 2018-03-15

## 2017-10-28 NOTE — Progress Notes (Signed)
Patient: Joanna Robinson Female    DOB: [birth year]   37 y.o.   MRN: [id number] Visit Date: 10/28/2017  Today's Provider: Margaretann Loveless, PA-C   Chief Complaint  Patient presents with  . Follow-up    Carbamazepine Levels   Subjective:    HPI Patient here today to follow up on Carbamazepine levels. She is doing well. She has been going to summer camp and not having any issues. She is accompanied by her mother today.   They are requesting a few of her medications to be refilled.     Allergies  Allergen Reactions  . Codeine   . Corticosteroids Nausea Only     Current Outpatient Medications:  .  acetaZOLAMIDE (DIAMOX) 250 MG tablet, TAKE 1 TABLET BY MOUTH TWICE DAILY, Disp: 60 tablet, Rfl: 5 .  Alpha-D-Galactosidase (BEANO) TABS, Take by mouth as needed (2-3 Tabs as needed prior to meal that may cause bloating.)., Disp: , Rfl:  .  Aluminum & Magnesium Hydroxide (MYLANTA ULTIMATE STRENGTH) 500-500 MG/5ML SUSP, MYLANTA ULTIMATE STRENGTH, 500-500MG /5ML (Oral Suspension)  1 TID as needed for 0 days  Quantity: 0.00;  Refills: 0   Ordered :14-Mar-2010  Denna Haggard, MA, Anastasiya ;  Started 19-October-2008 Active Comments: Medication taken as needed. , Disp: , Rfl:  .  benzoyl peroxide-erythromycin (BENZAMYCIN) gel, Apply 2 (two) times daily topically., Disp: 46.6 g, Rfl: 5 .  CARBATROL 200 MG 12 hr capsule, GIVE Jyll 2 CAPSULES IN THE MORNING AND 1 CAPSULE IN THE EVENING, Disp: 90 capsule, Rfl: 5 .  CARBATROL 300 MG 12 hr capsule, TAKE 1 CAPSULE BY MOUTH AT BEDTIME ALONGWITH CARBATROL 200MG  CAPSULE, Disp: 30 capsule, Rfl: 5 .  cloNIDine (CATAPRES) 0.2 MG tablet, TAKE 1 TABLET BY MOUTH AT BEDTIME, Disp: 90 tablet, Rfl: 1 .  Dextromethorphan-Menthol (DELSYM COUGH RELIEF MT), Use as directed in the mouth or throat as needed (2 Tsp. by mouth every 12 hours as needed for cough.). Reported on 04/07/2015, Disp: , Rfl:  .  DYMISTA 137-50 MCG/ACT SUSP, USE SPRAY INTO EACH NOSTRIL TWICE  DAILY, Disp: 23 g, Rfl: 3 .  fexofenadine (ALLEGRA ALLERGY) 180 MG tablet, Take 1 tablet (180 mg total) by mouth daily., Disp: 90 tablet, Rfl: 3 .  fluvoxaMINE (LUVOX) 100 MG tablet, TAKE 1 TABLET BY MOUTH TWICE DAILY., Disp: 180 tablet, Rfl: 1 .  furosemide (LASIX) 20 MG tablet, Take 1 tablet by mouth daily., Disp: , Rfl:  .  GAVILAX powder, MIX 17 GRAMS IN 4 TO 8 OZ OF FLUID AND DRINK AT BEDTIME, Disp: 527 g, Rfl: 3 .  hydrochlorothiazide (HYDRODIURIL) 25 MG tablet, TAKE 1 TABLET BY MOUTH ONCE DAILY, Disp: 90 tablet, Rfl: 1 .  hydrocortisone (ANUSOL-HC) 2.5 % rectal cream, Place 1 application rectally 2 (two) times daily., Disp: , Rfl:  .  MAPAP 500 MG tablet, TAKE 2 TABLETS BY MOUTH 3 TIMES DAILY ASNEEDED HEADACHE, Disp: 60 tablet, Rfl: 5 .  montelukast (SINGULAIR) 10 MG tablet, Take 1 tablet (10 mg total) by mouth daily., Disp: 90 tablet, Rfl: 3 .  naproxen (NAPROSYN) 500 MG tablet, TAKE 1 TABLET BY MOUTH TWICE DAILY FOR ENTIRE MENSTRUAL CYCLE., Disp: 20 tablet, Rfl: 5 .  omega-3 acid ethyl esters (LOVAZA) 1 G capsule, Take 1 g by mouth daily., Disp: , Rfl:  .  potassium chloride (KLOR-CON) 20 MEQ packet, DISSOLVE CONTENTS OF 1 PACKET IN AT LEAST 4 OZ OF WATER/JUICE AND DRINK ONCEDAILY, Disp: 30 packet, Rfl: 11 .  sodium fluoride (DENTA 5000 PLUS) 1.1 % CREA dental cream, Place 1 application onto teeth every evening. Brush teeth daily for 1 minute, rinse and spit., Disp: 1 Tube, Rfl: 5 .  sulfacetamide (BLEPH-10) 10 % ophthalmic solution, Apply two drops to right eye 4 x day., Disp: 15 mL, Rfl: 5 .  Sunscreens (AVEENO CLEAR COMPLEXION BB) CREA, Apply as directed bid, Disp: 75 mL, Rfl: 11 .  TRIAMCINOLONE ACETONIDE, TOP, 0.05 % OINT, Apply to rash as needed TID., Disp: 85 g, Rfl: 5 .  Witch Hazel (TUCKS) 50 % PADS, Apply 50 % topically as needed (As needed for cleansing and hemorrhoid discomfort.)., Disp: , Rfl:   Review of Systems  Constitutional: Negative.   Respiratory: Negative.     Cardiovascular: Negative.   Gastrointestinal: Negative.   Neurological: Negative.     Social History   Tobacco Use  . Smoking status: Never Smoker  . Smokeless tobacco: Never Used  Substance Use Topics  . Alcohol use: No   Objective:   BP 110/60 (BP Location: Right Arm, Patient Position: Sitting, Cuff Size: Normal)   Pulse 65   Temp 98.4 F (36.9 C) (Oral)   Resp 16   Wt 175 lb (79.4 kg)   SpO2 98%   BMI 26.61 kg/m  Vitals:   10/28/17 1102  BP: 110/60  Pulse: 65  Resp: 16  Temp: 98.4 F (36.9 C)  TempSrc: Oral  SpO2: 98%  Weight: 175 lb (79.4 kg)     Physical Exam  Constitutional: She appears well-developed and well-nourished. No distress.  Neck: Normal range of motion. Neck supple.  Cardiovascular: Normal rate, regular rhythm and normal heart sounds. Exam reveals no gallop and no friction rub.  No murmur heard. Pulmonary/Chest: Effort normal and breath sounds normal. No respiratory distress. She has no wheezes. She has no rales.  Skin: She is not diaphoretic.  Vitals reviewed.      Assessment & Plan:     1. Elevated carbamazepine level Tegretol levels have been elevated of recent. Will recheck labs as below and forward to her Neurologist, Elveria Risingina Goodpasture, NP.  - Carbamazepine, Free and Total - CBC w/Diff/Platelet - Basic Metabolic Panel (BMET)  2. Epilepsy, not refractory (HCC) See above medical treatment plan. - Carbamazepine, Free and Total - CBC w/Diff/Platelet - Basic Metabolic Panel (BMET)  3. Active autistic disorder Stable.   4. Chronic conjunctivitis of right eye, unspecified chronic conjunctivitis type Stable. Diagnosis pulled for medication refill. Continue current medical treatment plan. - sulfacetamide (BLEPH-10) 10 % ophthalmic solution; Apply two drops to right eye 4 x day.  Dispense: 15 mL; Refill: 5  5. Dysmenorrhea Stable. Diagnosis pulled for medication refill. Continue current medical treatment plan. - naproxen (NAPROSYN)  500 MG tablet; TAKE 1 TABLET BY MOUTH TWICE DAILY FOR ENTIRE MENSTRUAL CYCLE.  Dispense: 180 tablet; Refill: 1  6. Decreased potassium in the blood Will check labs as below and f/u pending results. - Basic Metabolic Panel (BMET)  7. Long-term use of high-risk medication Will check labs as below and f/u pending results. - Carbamazepine, Free and Total - CBC w/Diff/Platelet - Basic Metabolic Panel (BMET)  8. Pure hypercholesterolemia Stable. Diagnosis pulled for medication refill. Continue current medical treatment plan. - omega-3 acid ethyl esters (LOVAZA) 1 g capsule; Take 1 capsule (1 g total) by mouth daily.  Dispense: 90 capsule; Refill: 1       Margaretann LovelessJennifer M Burnette, PA-C  [facility]

## 2017-10-29 ENCOUNTER — Telehealth: Payer: Self-pay

## 2017-10-29 LAB — CBC WITH DIFFERENTIAL/PLATELET
Basophils Absolute: 0 10*3/uL (ref 0.0–0.2)
Basos: 0 %
EOS (ABSOLUTE): 0.1 10*3/uL (ref 0.0–0.4)
EOS: 2 %
HEMATOCRIT: 37.8 % (ref 34.0–46.6)
Hemoglobin: 11.7 g/dL (ref 11.1–15.9)
IMMATURE GRANS (ABS): 0 10*3/uL (ref 0.0–0.1)
Immature Granulocytes: 0 %
LYMPHS ABS: 1 10*3/uL (ref 0.7–3.1)
LYMPHS: 30 %
MCH: 28.3 pg (ref 26.6–33.0)
MCHC: 31 g/dL — AB (ref 31.5–35.7)
MCV: 92 fL (ref 79–97)
Monocytes Absolute: 0.4 10*3/uL (ref 0.1–0.9)
Monocytes: 11 %
NEUTROS ABS: 2 10*3/uL (ref 1.4–7.0)
Neutrophils: 57 %
Platelets: 185 10*3/uL (ref 150–450)
RBC: 4.13 x10E6/uL (ref 3.77–5.28)
RDW: 13.6 % (ref 12.3–15.4)
WBC: 3.4 10*3/uL (ref 3.4–10.8)

## 2017-10-29 LAB — BASIC METABOLIC PANEL
BUN/Creatinine Ratio: 16 (ref 9–23)
BUN: 12 mg/dL (ref 6–20)
CO2: 22 mmol/L (ref 20–29)
CREATININE: 0.76 mg/dL (ref 0.57–1.00)
Calcium: 9.2 mg/dL (ref 8.7–10.2)
Chloride: 108 mmol/L — ABNORMAL HIGH (ref 96–106)
GFR calc Af Amer: 118 mL/min/{1.73_m2} (ref >59)
GFR, EST NON AFRICAN AMERICAN: 102 mL/min/{1.73_m2} (ref >59)
GLUCOSE: 101 mg/dL — AB (ref 65–99)
Potassium: 3.8 mmol/L (ref 3.5–5.2)
SODIUM: 143 mmol/L (ref 134–144)

## 2017-10-29 LAB — CARBAMAZEPINE, FREE AND TOTAL
CARBAMAZEPINE, TOTAL: 13.7 ug/mL — AB (ref 4.0–12.0)
Carbamazepine, Free: 3.4 ug/mL (ref 0.6–4.2)

## 2017-10-29 NOTE — Telephone Encounter (Signed)
Patient's mother advised as directed below. She asked to fax the notes to Elveria Risingina Goodpasture with [facility]. Will fax labs results.  Thanks,  -October Peery

## 2017-10-29 NOTE — Telephone Encounter (Signed)
-----   Message from Margaretann LovelessJennifer M Burnette, PA-C sent at 10/29/2017  8:27 AM EDT ----- Tegretol level is down to 13.7 from 15.3. Blood count is normal. Potassium, calcium, sodium normal. Kidney function normal. Sugar normal for non-fasting.

## 2017-11-20 ENCOUNTER — Other Ambulatory Visit: Payer: Self-pay | Admitting: Physician Assistant

## 2017-11-20 DIAGNOSIS — G479 Sleep disorder, unspecified: Secondary | ICD-10-CM

## 2017-12-15 ENCOUNTER — Other Ambulatory Visit: Payer: Self-pay | Admitting: Physician Assistant

## 2017-12-15 ENCOUNTER — Other Ambulatory Visit (INDEPENDENT_AMBULATORY_CARE_PROVIDER_SITE_OTHER): Payer: Self-pay | Admitting: Family

## 2017-12-15 DIAGNOSIS — G40209 Localization-related (focal) (partial) symptomatic epilepsy and epileptic syndromes with complex partial seizures, not intractable, without status epilepticus: Secondary | ICD-10-CM

## 2017-12-15 DIAGNOSIS — G479 Sleep disorder, unspecified: Secondary | ICD-10-CM

## 2017-12-15 DIAGNOSIS — R609 Edema, unspecified: Secondary | ICD-10-CM

## 2017-12-31 ENCOUNTER — Ambulatory Visit: Payer: Medicare Other | Admitting: Physician Assistant

## 2018-01-01 ENCOUNTER — Ambulatory Visit: Payer: Medicare Other

## 2018-01-04 ENCOUNTER — Ambulatory Visit: Payer: Medicare Other | Admitting: Physician Assistant

## 2018-01-05 ENCOUNTER — Ambulatory Visit (INDEPENDENT_AMBULATORY_CARE_PROVIDER_SITE_OTHER): Payer: Medicare Other | Admitting: Physician Assistant

## 2018-01-05 ENCOUNTER — Ambulatory Visit: Payer: Medicare Other | Admitting: Physician Assistant

## 2018-01-05 VITALS — BP 130/80 | HR 78 | Temp 98.4°F | Resp 16

## 2018-01-05 DIAGNOSIS — Z23 Encounter for immunization: Secondary | ICD-10-CM | POA: Diagnosis not present

## 2018-01-05 DIAGNOSIS — J029 Acute pharyngitis, unspecified: Secondary | ICD-10-CM | POA: Diagnosis not present

## 2018-01-05 NOTE — Progress Notes (Signed)
Patient: Joanna Robinson Female    DOB: June 10, 1980   37 y.o.   MRN: [id number] Visit Date: 01/06/2018  Today's Provider: Trey Sailors, PA-C   Chief Complaint  Patient presents with  . URI   Subjective:    HPI Upper Respiratory Infection: Patient is autistic and presents with her mother who provides the history. Patient complains of symptoms of a URI. Symptoms include cough and sore throat. Onset of symptoms was 3 days ago, unchanged since that time. She also c/o nasal congestion and sore throat for the past 3 days .  She is drinking plenty of fluids. Evaluation to date: none. Treatment to date: tylenol. Denies fevers, chills, nausea, vomiting, sick exposures.        Allergies  Allergen Reactions  . Codeine   . Corticosteroids Nausea Only     Current Outpatient Medications:  .  acetaZOLAMIDE (DIAMOX) 250 MG tablet, TAKE 1 TABLET BY MOUTH TWICE DAILY, Disp: 60 tablet, Rfl: 5 .  Alpha-D-Galactosidase (BEANO) TABS, Take by mouth as needed (2-3 Tabs as needed prior to meal that may cause bloating.)., Disp: , Rfl:  .  Aluminum & Magnesium Hydroxide (MYLANTA ULTIMATE STRENGTH) 500-500 MG/5ML SUSP, MYLANTA ULTIMATE STRENGTH, 500-500MG /5ML (Oral Suspension)  1 TID as needed for 0 days  Quantity: 0.00;  Refills: 0   Ordered :14-Mar-2010  Denna Haggard, MA, Anastasiya ;  Started 19-October-2008 Active Comments: Medication taken as needed. , Disp: , Rfl:  .  benzoyl peroxide-erythromycin (BENZAMYCIN) gel, Apply 2 (two) times daily topically., Disp: 46.6 g, Rfl: 5 .  CARBATROL 200 MG 12 hr capsule, TAKE 2 CAPSULES BY MOUTH ONCE EVERY MORNING AND 1 IN THE EVENING., Disp: 90 capsule, Rfl: 5 .  CARBATROL 300 MG 12 hr capsule, TAKE 1 CAPSULE BY MOUTH AT BEDTIME ALONGWITH CARBATROL 200 MG CAPSULE., Disp: 30 capsule, Rfl: 5 .  cloNIDine (CATAPRES) 0.2 MG tablet, TAKE 1 TABLET BY MOUTH AT BEDTIME, Disp: 90 tablet, Rfl: 1 .  Dextromethorphan-Menthol (DELSYM COUGH RELIEF MT), Use as directed  in the mouth or throat as needed (2 Tsp. by mouth every 12 hours as needed for cough.). Reported on 04/07/2015, Disp: , Rfl:  .  DYMISTA 137-50 MCG/ACT SUSP, USE SPRAY INTO EACH NOSTRIL TWICE DAILY, Disp: 23 g, Rfl: 3 .  fexofenadine (ALLEGRA ALLERGY) 180 MG tablet, Take 1 tablet (180 mg total) by mouth daily., Disp: 90 tablet, Rfl: 3 .  fluvoxaMINE (LUVOX) 100 MG tablet, TAKE 1 TABLET BY MOUTH TWICE DAILY., Disp: 180 tablet, Rfl: 1 .  furosemide (LASIX) 20 MG tablet, Take 1 tablet by mouth daily., Disp: , Rfl:  .  GAVILAX powder, MIX 17 GRAMS IN 4 TO 8 OZ OF FLUID AND DRINK AT BEDTIME, Disp: 527 g, Rfl: 3 .  hydrochlorothiazide (HYDRODIURIL) 25 MG tablet, TAKE 1 TABLET BY MOUTH ONCE DAILY., Disp: 90 tablet, Rfl: 1 .  hydrocortisone (ANUSOL-HC) 2.5 % rectal cream, Place 1 application rectally 2 (two) times daily., Disp: , Rfl:  .  MAPAP 500 MG tablet, TAKE 2 TABLETS BY MOUTH 3 TIMES DAILY ASNEEDED HEADACHE, Disp: 60 tablet, Rfl: 5 .  montelukast (SINGULAIR) 10 MG tablet, Take 1 tablet (10 mg total) by mouth daily., Disp: 90 tablet, Rfl: 3 .  naproxen (NAPROSYN) 500 MG tablet, TAKE 1 TABLET BY MOUTH TWICE DAILY FOR ENTIRE MENSTRUAL CYCLE., Disp: 180 tablet, Rfl: 1 .  omega-3 acid ethyl esters (LOVAZA) 1 g capsule, Take 1 capsule (1 g total) by mouth daily., Disp: 90  capsule, Rfl: 1 .  potassium chloride (KLOR-CON) 20 MEQ packet, DISSOLVE CONTENTS OF 1 PACKET IN AT LEAST 4 OZ OF WATER/JUICE AND DRINK ONCEDAILY, Disp: 30 packet, Rfl: 11 .  sodium fluoride (DENTA 5000 PLUS) 1.1 % CREA dental cream, Place 1 application onto teeth every evening. Brush teeth daily for 1 minute, rinse and spit., Disp: 1 Tube, Rfl: 5 .  sulfacetamide (BLEPH-10) 10 % ophthalmic solution, Apply two drops to right eye 4 x day., Disp: 15 mL, Rfl: 5 .  Sunscreens (AVEENO CLEAR COMPLEXION BB) CREA, Apply as directed bid, Disp: 75 mL, Rfl: 11 .  TRIAMCINOLONE ACETONIDE, TOP, 0.05 % OINT, Apply to rash as needed TID., Disp: 85 g, Rfl:  5 .  Witch Hazel (TUCKS) 50 % PADS, Apply 50 % topically as needed (As needed for cleansing and hemorrhoid discomfort.)., Disp: , Rfl:   Review of Systems  Constitutional: Negative.   HENT: Positive for congestion and sore throat.   Respiratory: Positive for cough.     Social History   Tobacco Use  . Smoking status: Never Smoker  . Smokeless tobacco: Never Used  Substance Use Topics  . Alcohol use: No   Objective:   BP 130/80 (BP Location: Right Arm, Patient Position: Sitting, Cuff Size: Normal)   Pulse 78   Temp 98.4 F (36.9 C) (Oral)   Resp 16   SpO2 97%  Vitals:   01/05/18 1427  BP: 130/80  Pulse: 78  Resp: 16  Temp: 98.4 F (36.9 C)  TempSrc: Oral  SpO2: 97%     Physical Exam  Constitutional: She is oriented to person, place, and time. She appears well-developed and well-nourished.  HENT:  Right Ear: External ear normal.  Left Ear: External ear normal.  Mouth/Throat: Uvula is midline. Posterior oropharyngeal erythema present. No oropharyngeal exudate or posterior oropharyngeal edema.  Cardiovascular: Normal rate and regular rhythm.  Pulmonary/Chest: Effort normal and breath sounds normal.  Lymphadenopathy:    She has no cervical adenopathy.  Neurological: She is alert and oriented to person, place, and time.  Skin: Skin is warm and dry.  Psychiatric: She has a normal mood and affect. Her behavior is normal.        Assessment & Plan:     1. Sore throat  Likely viral or exacerbation of allergies. Please call back if not improving, will send in abx.   2. Need for influenza vaccination  - Flu Vaccine QUAD 36+ mos IM  Return if symptoms worsen or fail to improve.  The entirety of the information documented in the History of Present Illness, Review of Systems and Physical Exam were personally obtained by me. Portions of this information were initially documented by Rondel Baton, CMA and reviewed by me for thoroughness and accuracy.        Trey Sailors, PA-C  [facility]

## 2018-01-06 NOTE — Patient Instructions (Signed)

## 2018-01-12 ENCOUNTER — Telehealth: Payer: Self-pay

## 2018-01-12 NOTE — Telephone Encounter (Signed)
Called pt to r/s previously missed AWV and pt states she would like a CB tomorrow morning when she is around her calender. Note made. -MM

## 2018-01-21 ENCOUNTER — Other Ambulatory Visit: Payer: Self-pay | Admitting: Physician Assistant

## 2018-01-21 DIAGNOSIS — E876 Hypokalemia: Secondary | ICD-10-CM

## 2018-01-21 NOTE — Telephone Encounter (Signed)
Jenni's patient 

## 2018-02-01 ENCOUNTER — Ambulatory Visit (INDEPENDENT_AMBULATORY_CARE_PROVIDER_SITE_OTHER): Payer: Medicare Other | Admitting: Family

## 2018-02-01 ENCOUNTER — Ambulatory Visit (INDEPENDENT_AMBULATORY_CARE_PROVIDER_SITE_OTHER): Payer: Medicare Other | Admitting: Pediatrics

## 2018-02-01 NOTE — Telephone Encounter (Signed)
Unable to contact the pt after several attempts made and VM left. Closing TE. -MM

## 2018-02-12 ENCOUNTER — Other Ambulatory Visit: Payer: Self-pay | Admitting: Physician Assistant

## 2018-02-12 DIAGNOSIS — G40909 Epilepsy, unspecified, not intractable, without status epilepticus: Secondary | ICD-10-CM

## 2018-02-12 NOTE — Progress Notes (Signed)
Ordered tegretol levels

## 2018-02-17 ENCOUNTER — Other Ambulatory Visit: Payer: Self-pay | Admitting: Physician Assistant

## 2018-02-17 ENCOUNTER — Other Ambulatory Visit: Payer: Self-pay | Admitting: Family Medicine

## 2018-02-17 DIAGNOSIS — J302 Other seasonal allergic rhinitis: Secondary | ICD-10-CM

## 2018-02-17 DIAGNOSIS — E876 Hypokalemia: Secondary | ICD-10-CM

## 2018-02-24 ENCOUNTER — Ambulatory Visit (INDEPENDENT_AMBULATORY_CARE_PROVIDER_SITE_OTHER): Payer: Medicare Other | Admitting: Family

## 2018-03-10 ENCOUNTER — Ambulatory Visit (INDEPENDENT_AMBULATORY_CARE_PROVIDER_SITE_OTHER): Payer: Medicare Other | Admitting: Family

## 2018-03-12 ENCOUNTER — Ambulatory Visit: Payer: Medicare Other | Admitting: Physician Assistant

## 2018-03-15 ENCOUNTER — Encounter: Payer: Self-pay | Admitting: Physician Assistant

## 2018-03-15 ENCOUNTER — Ambulatory Visit (INDEPENDENT_AMBULATORY_CARE_PROVIDER_SITE_OTHER): Payer: Medicare Other | Admitting: Physician Assistant

## 2018-03-15 VITALS — BP 113/75 | HR 86 | Temp 98.5°F | Resp 20

## 2018-03-15 DIAGNOSIS — J0100 Acute maxillary sinusitis, unspecified: Secondary | ICD-10-CM

## 2018-03-15 DIAGNOSIS — H10401 Unspecified chronic conjunctivitis, right eye: Secondary | ICD-10-CM

## 2018-03-15 DIAGNOSIS — J302 Other seasonal allergic rhinitis: Secondary | ICD-10-CM | POA: Diagnosis not present

## 2018-03-15 DIAGNOSIS — M542 Cervicalgia: Secondary | ICD-10-CM

## 2018-03-15 DIAGNOSIS — G40909 Epilepsy, unspecified, not intractable, without status epilepticus: Secondary | ICD-10-CM | POA: Diagnosis not present

## 2018-03-15 LAB — POCT RAPID STREP A (OFFICE): Rapid Strep A Screen: NEGATIVE

## 2018-03-15 MED ORDER — FEXOFENADINE HCL 180 MG PO TABS: 180.0000 mg | ORAL_TABLET | Freq: Every day | ORAL | 3 refills | Status: DC

## 2018-03-15 MED ORDER — SULFACETAMIDE SODIUM 10 % OP SOLN: OPHTHALMIC | 5 refills | Status: DC

## 2018-03-15 MED ORDER — AZITHROMYCIN 250 MG PO TABS: ORAL_TABLET | ORAL | 0 refills | Status: DC

## 2018-03-15 NOTE — Progress Notes (Signed)
Patient: Joanna Robinson Female    DOB: 1980/10/19   37 y.o.   MRN: [id number] Visit Date: 03/15/2018  Today's Provider: Margaretann LovelessJennifer M Kabe Mckoy, PA-C   Chief Complaint  Patient presents with  . Neck Pain    sore throat   Subjective:     HPI Patient here today c/o possible neck pain/sore throat x's on and off x's 4 days. Patient's mother reports that Joanna HeckDanielle will c/o these symptoms when she is on her menstrual cycle. Mother reports Joanna HeckDanielle takes Tylenol and Naproxen without relief. Patient is not near starting her menstrual at this time.   Mother is requesting refill on Allegra and sulfacetamide eye drops.  Allergies  Allergen Reactions  . Codeine   . Corticosteroids Nausea Only     Current Outpatient Medications:  .  acetaZOLAMIDE (DIAMOX) 250 MG tablet, TAKE 1 TABLET BY MOUTH TWICE DAILY, Disp: 60 tablet, Rfl: 5 .  Alpha-D-Galactosidase (BEANO) TABS, Take by mouth as needed (2-3 Tabs as needed prior to meal that may cause bloating.)., Disp: , Rfl:  .  Aluminum & Magnesium Hydroxide (MYLANTA ULTIMATE STRENGTH) 500-500 MG/5ML SUSP, MYLANTA ULTIMATE STRENGTH, 500-500MG /5ML (Oral Suspension)  1 TID as needed for 0 days  Quantity: 0.00;  Refills: 0   Ordered :14-Mar-2010  Joanna HaggardAleksandrova, MA, Joanna Robinson ;  Started 19-October-2008 Active Comments: Medication taken as needed. , Disp: , Rfl:  .  benzoyl peroxide-erythromycin (BENZAMYCIN) gel, Apply 2 (two) times daily topically., Disp: 46.6 g, Rfl: 5 .  CARBATROL 200 MG 12 hr capsule, TAKE 2 CAPSULES BY MOUTH ONCE EVERY MORNING AND 1 IN THE EVENING., Disp: 90 capsule, Rfl: 5 .  CARBATROL 300 MG 12 hr capsule, TAKE 1 CAPSULE BY MOUTH AT BEDTIME ALONGWITH CARBATROL 200 MG CAPSULE., Disp: 30 capsule, Rfl: 5 .  cloNIDine (CATAPRES) 0.2 MG tablet, TAKE 1 TABLET BY MOUTH AT BEDTIME, Disp: 90 tablet, Rfl: 1 .  Dextromethorphan-Menthol (DELSYM COUGH RELIEF MT), Use as directed in the mouth or throat as needed (2 Tsp. by mouth every 12 hours as  needed for cough.). Reported on 04/07/2015, Disp: , Rfl:  .  DYMISTA 137-50 MCG/ACT SUSP, USE SPRAY INTO EACH NOSTRIL TWICE DAILY, Disp: 23 g, Rfl: 3 .  fexofenadine (ALLEGRA ALLERGY) 180 MG tablet, Take 1 tablet (180 mg total) by mouth daily., Disp: 90 tablet, Rfl: 3 .  fluvoxaMINE (LUVOX) 100 MG tablet, TAKE 1 TABLET BY MOUTH TWICE DAILY., Disp: 180 tablet, Rfl: 1 .  furosemide (LASIX) 20 MG tablet, Take 1 tablet by mouth daily., Disp: , Rfl:  .  GAVILAX powder, MIX 17 GRAMS IN 4 TO 8 OZ OF FLUID AND DRINK AT BEDTIME, Disp: 527 g, Rfl: 3 .  hydrochlorothiazide (HYDRODIURIL) 25 MG tablet, TAKE 1 TABLET BY MOUTH ONCE DAILY., Disp: 90 tablet, Rfl: 1 .  hydrocortisone (ANUSOL-HC) 2.5 % rectal cream, Place 1 application rectally 2 (two) times daily., Disp: , Rfl:  .  MAPAP 500 MG tablet, TAKE 2 TABLETS BY MOUTH 3 TIMES DAILY ASNEEDED HEADACHE, Disp: 60 tablet, Rfl: 5 .  montelukast (SINGULAIR) 10 MG tablet, TAKE 1 TABLET BY MOUTH ONCE DAILY, Disp: 90 tablet, Rfl: 3 .  naproxen (NAPROSYN) 500 MG tablet, TAKE 1 TABLET BY MOUTH TWICE DAILY FOR ENTIRE MENSTRUAL CYCLE., Disp: 180 tablet, Rfl: 1 .  omega-3 acid ethyl esters (LOVAZA) 1 g capsule, Take 1 capsule (1 g total) by mouth daily., Disp: 90 capsule, Rfl: 1 .  potassium chloride (KLOR-CON) 20 MEQ packet, DISSOLVE 1 PACKET  IN 4 OZ OF LIQUID AND DRINK ONCE DAILY, Disp: 90 packet, Rfl: 1 .  sodium fluoride (DENTA 5000 PLUS) 1.1 % CREA dental cream, Place 1 application onto teeth every evening. Brush teeth daily for 1 minute, rinse and spit., Disp: 1 Tube, Rfl: 5 .  sulfacetamide (BLEPH-10) 10 % ophthalmic solution, Apply two drops to right eye 4 x day., Disp: 15 mL, Rfl: 5 .  Sunscreens (AVEENO CLEAR COMPLEXION BB) CREA, Apply as directed bid, Disp: 75 mL, Rfl: 11 .  TRIAMCINOLONE ACETONIDE, TOP, 0.05 % OINT, Apply to rash as needed TID., Disp: 85 g, Rfl: 5 .  Witch Hazel (TUCKS) 50 % PADS, Apply 50 % topically as needed (As needed for cleansing and  hemorrhoid discomfort.)., Disp: , Rfl:   Review of Systems  Constitutional: Negative.  Negative for fever.  HENT: Positive for congestion and sore throat. Negative for ear pain, postnasal drip, sinus pressure and sinus pain.   Respiratory: Positive for cough.   Cardiovascular: Negative.   Gastrointestinal: Negative.   Neurological: Negative for dizziness and headaches.    Social History   Tobacco Use  . Smoking status: Never Smoker  . Smokeless tobacco: Never Used  Substance Use Topics  . Alcohol use: No      Objective:   BP 113/75 (BP Location: Left Arm, Patient Position: Sitting, Cuff Size: Normal)   Pulse 86   Temp 98.5 F (36.9 C) (Oral)   Resp 20   SpO2 99%  Vitals:   03/15/18 1149  BP: 113/75  Pulse: 86  Resp: 20  Temp: 98.5 F (36.9 C)  TempSrc: Oral  SpO2: 99%     Physical Exam Vitals signs reviewed.  Constitutional:      General: She is not in acute distress.    Appearance: She is well-developed. She is not diaphoretic.  HENT:     Head: Normocephalic and atraumatic.     Right Ear: Hearing, tympanic membrane, ear canal and external ear normal.     Left Ear: Hearing, tympanic membrane, ear canal and external ear normal.     Nose: Mucosal edema, congestion and rhinorrhea present. Rhinorrhea is clear.     Right Turbinates: Swollen.     Left Turbinates: Swollen.     Right Sinus: Maxillary sinus tenderness present. No frontal sinus tenderness.     Left Sinus: Maxillary sinus tenderness present. No frontal sinus tenderness.     Mouth/Throat:     Pharynx: Uvula midline. No oropharyngeal exudate.  Neck:     Musculoskeletal: Normal range of motion and neck supple.     Thyroid: No thyromegaly.     Trachea: No tracheal deviation.  Cardiovascular:     Rate and Rhythm: Normal rate and regular rhythm.     Heart sounds: Normal heart sounds. No murmur. No friction rub. No gallop.   Pulmonary:     Effort: Pulmonary effort is normal. No respiratory distress.      Breath sounds: Normal breath sounds. No stridor. No wheezing or rales.  Lymphadenopathy:     Cervical: No cervical adenopathy.         Assessment & Plan    1. Sore neck Strep testing was negative today.  - POCT rapid strep A  2. Other seasonal allergic rhinitis Stable. Diagnosis pulled for medication refill. Continue current medical treatment plan. - fexofenadine (ALLEGRA ALLERGY) 180 MG tablet; Take 1 tablet (180 mg total) by mouth daily.  Dispense: 90 tablet; Refill: 3  3. Chronic conjunctivitis of right eye, unspecified  chronic conjunctivitis type Stable. Diagnosis pulled for medication refill. Continue current medical treatment plan. - sulfacetamide (BLEPH-10) 10 % ophthalmic solution; Apply two drops to right eye 4 x day.  Dispense: 15 mL; Refill: 5  4. Acute non-recurrent maxillary sinusitis Worsening symptoms that have not responded to OTC medications. Will give Zpak as below. Continue allergy medications. Stay well hydrated and get plenty of rest. Call if no symptom improvement or if symptoms worsen. - azithromycin (ZITHROMAX) 250 MG tablet; Take 2 tablets PO on day one, and one tablet PO daily thereafter until completed.  Dispense: 6 tablet; Refill: 0     Joanna Loveless, PA-C  [facility]

## 2018-03-15 NOTE — Patient Instructions (Signed)

## 2018-03-16 ENCOUNTER — Telehealth: Payer: Self-pay

## 2018-03-16 LAB — CARBAMAZEPINE, FREE AND TOTAL
CARBAMAZEPINE FREE: 3.8 ug/mL (ref 0.6–4.2)
CARBAMAZEPINE, TOTAL: 15 ug/mL — AB (ref 4.0–12.0)

## 2018-03-16 NOTE — Telephone Encounter (Signed)
Patient's mother [name] advised.  

## 2018-03-16 NOTE — Telephone Encounter (Signed)
LMTCB

## 2018-03-16 NOTE — Telephone Encounter (Signed)
-----   Message from Margaretann LovelessJennifer M Burnette, PA-C sent at 03/16/2018  8:21 AM EST ----- Tegretol level up slightly to 15.0. This has been sent to Elveria Risingina Goodpasture, NP.

## 2018-03-16 NOTE — Telephone Encounter (Signed)
Patient is returning your call. KW 

## 2018-03-22 ENCOUNTER — Telehealth: Payer: Self-pay | Admitting: Physician Assistant

## 2018-03-22 NOTE — Telephone Encounter (Signed)
Pt took medication Jenni prescribed for her sore throat.  However, the sore throat is coming back.   Pt is also out of her nasal spray.  Mother is asking for a refill on the nasal spray and for something else to be called into the pharmacy for sore throat.  Please call into: [facility] [place], [place] - [address] [phone] (Phone) [phone] (Fax)   Thanks, [employer]

## 2018-03-23 MED ORDER — AZELASTINE-FLUTICASONE 137-50 MCG/ACT NA SUSP: NASAL | 3 refills | Status: DC

## 2018-03-23 NOTE — Telephone Encounter (Signed)
Patient's mother [name] advised.  

## 2018-03-23 NOTE — Telephone Encounter (Signed)
Nasal spray refilled.  Strep test was negative, so sore throat is either viral or allergic in nature, so an antibiotic will not help this.  Recommend nasal spray to help with drainage, chloraseptic spray to numb and soothe throat, and tylenol as needed.  Erasmo DownerBacigalupo, Rachell Druckenmiller M, MD, MPH [facility] 03/23/2018 9:04 AM

## 2018-03-26 ENCOUNTER — Ambulatory Visit (INDEPENDENT_AMBULATORY_CARE_PROVIDER_SITE_OTHER): Payer: Medicare Other | Admitting: Family

## 2018-04-01 ENCOUNTER — Encounter (INDEPENDENT_AMBULATORY_CARE_PROVIDER_SITE_OTHER): Payer: Self-pay | Admitting: Family

## 2018-04-01 ENCOUNTER — Ambulatory Visit (INDEPENDENT_AMBULATORY_CARE_PROVIDER_SITE_OTHER): Payer: Medicare Other | Admitting: Family

## 2018-04-01 VITALS — BP 90/70 | HR 64 | Ht 68.0 in | Wt 172.0 lb

## 2018-04-01 DIAGNOSIS — F79 Unspecified intellectual disabilities: Secondary | ICD-10-CM

## 2018-04-01 DIAGNOSIS — G40909 Epilepsy, unspecified, not intractable, without status epilepticus: Secondary | ICD-10-CM

## 2018-04-01 DIAGNOSIS — F840 Autistic disorder: Secondary | ICD-10-CM | POA: Diagnosis not present

## 2018-04-01 NOTE — Progress Notes (Signed)
Patient: Joanna Robinson MRN: [id number] Sex: female DOB: 09/08/1980  Provider: Elveria Risingina Delores Edelstein, NP Location of Care: [facility]  Note type: Routine return visit  History of Present Illness: Referral Source: Lorie PhenixNancy Maloney, MD History from: mother, patient and [facility] chart Chief Complaint: Epilepsy/Problems with eyes  Joanna SaaDanielle L Robinson is a 38 y.o. woman with history of undifferentiated autism and complex partial seizures. She was last seen February 24, 2017. [name] is taking and tolerating Carbatrol for her seizure disorder. She tends to have brief seizures at the onset of her menstrual period. Her PCP drew labs 2 weeks ago revealing a random Carbamazepine level of 15.370mcg/ml.  Mom reports today that [name] had a throat infection in December but has been otherwise healthy and doing well since her last visit. She attends two different day programs while Mom is at work. One of those has been a good experience, but Mom says that the other feels that Fiorela's behaviors are willful and not part of autism. They refuse to use her wheelchair for outings because [name] has the ability to walk, then call Mom to complain about her behavior when [name] will not walk quickly enough or when she stops walking altogether, which is typical behavior her.   Mom has no other health concerns for [name] today other than previously mentioned.  Review of Systems: Please see the HPI for neurologic and other pertinent review of systems. Otherwise, all other systems were reviewed and were negative.    Past Medical History:  Diagnosis Date  . Autism   . Epilepsy (HCC)   . Hemorrhoid   . Hyperlipidemia   . Seizures (HCC)    Hospitalizations: No., Head Injury: No., Nervous System Infections: No., Immunizations up to date: Yes.   Past Medical History Comments: see HPI  Surgical History Past Surgical History:  Procedure Laterality Date  . NO PAST SURGERIES      Family  History family history includes Alzheimer's disease in her maternal grandmother and paternal grandfather. Family History is otherwise negative for migraines, seizures, cognitive impairment, blindness, deafness, birth defects, chromosomal disorder, autism.  Social History Social History   Socioeconomic History  . Marital status: Single    Spouse name: Not on file  . Number of children: Not on file  . Years of education: Not on file  . Highest education level: Not on file  Occupational History  . Not on file  Social Needs  . Financial resource strain: Not on file  . Food insecurity:    Worry: Not on file    Inability: Not on file  . Transportation needs:    Medical: Not on file    Non-medical: Not on file  Tobacco Use  . Smoking status: Never Smoker  . Smokeless tobacco: Never Used  Substance and Sexual Activity  . Alcohol use: No  . Drug use: No  . Sexual activity: Never  Lifestyle  . Physical activity:    Days per week: Not on file    Minutes per session: Not on file  . Stress: Not on file  Relationships  . Social connections:    Talks on phone: Not on file    Gets together: Not on file    Attends religious service: Not on file    Active member of club or organization: Not on file    Attends meetings of clubs or organizations: Not on file    Relationship status: Not on file  Other Topics Concern  . 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 She has a nurse that comes to her home and they go out on field trips. She enjoys exercise, dance, and riding bike. She lives with her mother.    Allergies Allergies  Allergen Reactions  . Codeine   . Corticosteroids Nausea Only    Physical Exam BP 90/70   Pulse 64   Ht 5\' 8"  (1.727 m)   Wt 172 lb (78 kg)   BMI 26.15 kg/m  General: Well developed, well nourished, seated, in no evident distress, black hair, brown eyes, right handed Head: Head normocephalic and atraumatic.  Oropharynx  benign. Neck: Supple with no carotid bruits Cardiovascular: Regular rate and rhythm, no murmurs Respiratory: Breath sounds clear to auscultation Musculoskeletal: No obvious deformities or scoliosis Skin: No rashes or neurocutaneous lesions  Neurologic Exam Mental Status: Awake and fully alert. Has limited language. When she speaks she exhibits echolalia. She frequently flaps her hands and arms. She was able to follow some simple commands and was fairly tolerant of examination. Cranial Nerves: Fundoscopic exam reveals sharp disc margins.  Pupils equal, briskly reactive to light. Turns to localize objects, faces and sounds in the periphery. Facial sensation intact.  Face tongue, palate move normally and symmetrically.  Neck flexion and extension normal. Motor: Normal functional bulk, tone and strength. Sensory: Withdrawal x 4 Coordination: Unable to adequately assess due to her inability to participate in examination.  Gait and Station: Arises from chair without difficulty.  Stance is normal. Gait is very slow, but otherwise has normal stride length and balance.  Reflexes: Diminished and symmetric. Toes downgoing.  Impression 1.  Autism spectrum disorder 2.  Complex partial seizures with secondary generalization   Recommendations for plan of care The patient's previous [facility] records were reviewed. Shanae has neither had nor required imaging or lab studies since the last visit other than what was performed by her PCP. Mom is aware of those results. [name] is a 38 year old woman with history of undifferentiated autism and complex partial seizures with secondary generalization. She is taking and tolerating Carbatrol for seizures and will continue at the same dose without change. I will write a letter to her day program to explain Oktober's condition and will mail it to Mom. I will otherwise see Tiffney back in follow up in 1 year or sooner if needed. Mom agreed with the plans made today.  The  medication list was reviewed and reconciled.  No changes were made in the prescribed medications today.  A complete medication list was provided to her mother.  Allergies as of 04/01/2018      Reactions   Codeine    Corticosteroids Nausea Only      Medication List       Accurate as of April 01, 2018  3:14 PM. Always use your most recent med list.        acetaZOLAMIDE 250 MG tablet Commonly known as:  DIAMOX TAKE 1 TABLET BY MOUTH TWICE DAILY   AVEENO CLEAR COMPLEXION BB Crea Apply as directed bid   Azelastine-Fluticasone 137-50 MCG/ACT Susp Commonly known as:  DYMISTA USE SPRAY INTO EACH NOSTRIL TWICE DAILY   azithromycin 250 MG tablet Commonly known as:  ZITHROMAX Take 2 tablets PO on day one, and one tablet PO daily thereafter until completed.   BEANO Tabs Take by mouth as needed (2-3 Tabs as needed prior to meal that may cause bloating.).   benzoyl peroxide-erythromycin gel Commonly known as:  BENZAMYCIN Apply 2 (two) times daily topically.   CARBATROL 200 MG 12 hr capsule Generic drug:  carbamazepine TAKE 2 CAPSULES BY MOUTH ONCE EVERY MORNING AND 1 IN THE EVENING.   CARBATROL 300 MG 12 hr capsule Generic drug:  carbamazepine TAKE 1 CAPSULE BY MOUTH AT BEDTIME ALONGWITH CARBATROL 200 MG CAPSULE.   cloNIDine 0.2 MG tablet Commonly known as:  CATAPRES TAKE 1 TABLET BY MOUTH AT BEDTIME   DELSYM COUGH RELIEF MT Use as directed in the mouth or throat as needed (2 Tsp. by mouth every 12 hours as needed for cough.). Reported on 04/07/2015   fexofenadine 180 MG tablet Commonly known as:  ALLEGRA ALLERGY Take 1 tablet (180 mg total) by mouth daily.   fluvoxaMINE 100 MG tablet Commonly known as:  LUVOX TAKE 1 TABLET BY MOUTH TWICE DAILY.   GAVILAX powder Generic drug:  polyethylene glycol powder MIX 17 GRAMS IN 4 TO 8 OZ OF FLUID AND DRINK AT BEDTIME   hydrochlorothiazide 25 MG tablet Commonly known as:  HYDRODIURIL TAKE 1 TABLET BY MOUTH ONCE DAILY.     hydrocortisone 2.5 % rectal cream Commonly known as:  ANUSOL-HC Place 1 application rectally 2 (two) times daily.   LASIX 20 MG tablet Generic drug:  furosemide Take 1 tablet by mouth daily.   MAPAP 500 MG tablet Generic drug:  acetaminophen TAKE 2 TABLETS BY MOUTH 3 TIMES DAILY ASNEEDED HEADACHE   montelukast 10 MG tablet Commonly known as:  SINGULAIR TAKE 1 TABLET BY MOUTH ONCE DAILY   MYLANTA ULTIMATE STRENGTH 500-500 MG/5ML Susp Generic drug:  Aluminum & Magnesium Hydroxide MYLANTA ULTIMATE STRENGTH, 500-500MG /5ML (Oral Suspension)  1 TID as needed for 0 days  Quantity: 0.00;  Refills: 0   Ordered :14-Mar-2010  Denna HaggardAleksandrova, MA, Anastasiya ;  Started 19-October-2008 Active Comments: Medication taken as needed.   naproxen 500 MG tablet Commonly known as:  NAPROSYN TAKE 1 TABLET BY MOUTH TWICE DAILY FOR ENTIRE MENSTRUAL CYCLE.   omega-3 acid ethyl esters 1 g capsule Commonly known as:  LOVAZA Take 1 capsule (1 g total) by mouth daily.   potassium chloride 20 MEQ packet Commonly known as:  KLOR-CON DISSOLVE 1 PACKET IN 4 OZ OF LIQUID AND DRINK ONCE DAILY   sodium fluoride 1.1 % Crea dental cream Commonly known as:  DENTA 5000 PLUS Place 1 application onto teeth every evening. Brush teeth daily for 1 minute, rinse and spit.   sulfacetamide 10 % ophthalmic solution Commonly known as:  BLEPH-10 Apply two drops to right eye 4 x day.   TRIAMCINOLONE ACETONIDE (TOP) 0.05 % Oint Apply to rash as needed TID.   TUCKS 50 % Pads Apply 50 % topically as needed (As needed for cleansing and hemorrhoid discomfort.).      Total time spent with the patient was 20 minutes, of which 50% or more was spent in counseling and coordination of care.   Elveria Risingina Niclas Markell NP-C

## 2018-04-02 ENCOUNTER — Encounter (INDEPENDENT_AMBULATORY_CARE_PROVIDER_SITE_OTHER): Payer: Self-pay | Admitting: Family

## 2018-04-02 NOTE — Patient Instructions (Signed)
Thank you for coming in today.   Instructions for you until your next appointment are as follows: 1. Continue Joanna Robinson's medications as you have been giving them 2. Let me know if she has seizures 3. I will write a letter to her day program and will mail it to you.  4. Please plan to return for follow up in one year or sooner if needed.

## 2018-04-05 ENCOUNTER — Encounter (INDEPENDENT_AMBULATORY_CARE_PROVIDER_SITE_OTHER): Payer: Self-pay | Admitting: Family

## 2018-04-09 ENCOUNTER — Telehealth (INDEPENDENT_AMBULATORY_CARE_PROVIDER_SITE_OTHER): Payer: Self-pay | Admitting: Family

## 2018-04-09 NOTE — Telephone Encounter (Signed)
°  Who's calling (name and relationship to patient) : Junious Dresser ([facility]) Best contact number: [phone] Provider they see: Inetta Fermo  Reason for call: Junious Dresser would like a return call from [place] at her earliest convenience.

## 2018-04-13 ENCOUNTER — Other Ambulatory Visit: Payer: Self-pay | Admitting: Physician Assistant

## 2018-04-13 DIAGNOSIS — N946 Dysmenorrhea, unspecified: Secondary | ICD-10-CM

## 2018-04-13 NOTE — Telephone Encounter (Signed)
Junious Dresser called back and said that she had concerns about Seba being in the adult day program. She said that Lilyani needs one to one help, and that they cannot provide that for her. She plans to make a recommendation to Mom for Tasiyah to leave the program. TG

## 2018-04-13 NOTE — Telephone Encounter (Signed)
L/M informing Joanna Robinson that we received her phone message. Informed her that Joanna Robinson will give her a call

## 2018-04-13 NOTE — Telephone Encounter (Signed)
I called and left a message for Joanna Robinson asking her to call back. TG

## 2018-04-14 ENCOUNTER — Telehealth (INDEPENDENT_AMBULATORY_CARE_PROVIDER_SITE_OTHER): Payer: Self-pay | Admitting: Family

## 2018-04-14 NOTE — Telephone Encounter (Signed)
°  Who's calling (name and relationship to patient) : [name] - Mother   Best contact number: [phone]  Provider they see: Elveria Rising   Reason for call: Mom called in and left a voicemail yesterday at 5:48PM stating that cardinal has been trying to deny all of Anastazia's services. Dovie had missed a few appointments with Dr Lynnell Grain (psychiatrist) due to her being sick and now he will not see her. Mom just wanted to know if there was any way Inetta Fermo might could help them get back in with Dr. Ave Filter. His contact number is [phone]. Please advise.

## 2018-04-15 ENCOUNTER — Telehealth (INDEPENDENT_AMBULATORY_CARE_PROVIDER_SITE_OTHER): Payer: Self-pay | Admitting: Family

## 2018-04-15 NOTE — Telephone Encounter (Signed)
Who's calling (name and relationship to patient) : Sharion Dove (mom)  Best contact number: [phone]  Provider they see: Dr. [name]  Reason for call: Caller states she needs Dr. [name] to call her at [phone] about patient Joanna Robinson DOB [birth year]  Call ID: [id number]     PRESCRIPTION REFILL ONLY  Name of prescription:  Pharmacy:

## 2018-04-15 NOTE — Telephone Encounter (Signed)
I called and spoke with Mom. I have been unable to reach Dr Veda Canning office but I doubt that my request will overrule their office policy for no shows. TG

## 2018-04-15 NOTE — Telephone Encounter (Signed)
Mom called to thank me for sending the letter explaining Joanna Robinson's condition. TG

## 2018-04-20 DIAGNOSIS — F840 Autistic disorder: Secondary | ICD-10-CM | POA: Diagnosis not present

## 2018-05-13 ENCOUNTER — Other Ambulatory Visit (INDEPENDENT_AMBULATORY_CARE_PROVIDER_SITE_OTHER): Payer: Self-pay | Admitting: Family

## 2018-05-13 DIAGNOSIS — G40209 Localization-related (focal) (partial) symptomatic epilepsy and epileptic syndromes with complex partial seizures, not intractable, without status epilepticus: Secondary | ICD-10-CM

## 2018-06-17 ENCOUNTER — Other Ambulatory Visit: Payer: Self-pay | Admitting: Physician Assistant

## 2018-06-17 ENCOUNTER — Other Ambulatory Visit (INDEPENDENT_AMBULATORY_CARE_PROVIDER_SITE_OTHER): Payer: Self-pay | Admitting: Family

## 2018-06-17 DIAGNOSIS — G40209 Localization-related (focal) (partial) symptomatic epilepsy and epileptic syndromes with complex partial seizures, not intractable, without status epilepticus: Secondary | ICD-10-CM

## 2018-06-17 DIAGNOSIS — E876 Hypokalemia: Secondary | ICD-10-CM

## 2018-06-17 NOTE — Telephone Encounter (Signed)
Pharmacy is requesting refill.

## 2018-06-23 ENCOUNTER — Telehealth: Payer: Self-pay

## 2018-06-23 ENCOUNTER — Telehealth (INDEPENDENT_AMBULATORY_CARE_PROVIDER_SITE_OTHER): Payer: Self-pay | Admitting: Family

## 2018-06-23 DIAGNOSIS — J069 Acute upper respiratory infection, unspecified: Secondary | ICD-10-CM

## 2018-06-23 MED ORDER — AMOXICILLIN 875 MG PO TABS: 875.0000 mg | ORAL_TABLET | Freq: Two times a day (BID) | ORAL | 0 refills | Status: DC

## 2018-06-23 NOTE — Telephone Encounter (Signed)
Patient mother [name]) was advised.

## 2018-06-23 NOTE — Telephone Encounter (Signed)
I called and talked to Mom. She no longer has daily care for Joanna Robinson because of Covid 19 and needs a letter for her employer to use her FMLA time to stay at home with Joanna Robinson. I told Mom that I would send the letter as requested. Mom also asked for a copy to be emailed to her at [name], which I will do. TG

## 2018-06-23 NOTE — Telephone Encounter (Signed)
°  Who's calling (name and relationship to patient) : Joanna Robinson - Mother   Best contact number: [phone]  Provider they see: Elveria Rising   Reason for call:  Joanna Robinson called requesting paper work/documentation from [employer] showing that Joanna Robinson does need assistance 24/7. The in home nurse is high risk and cannot stay with [name] during this COVID-19. Also her daycare has shut down.  Documentation can be sent to her employer  Joanna Robinson)  Fax: [phone]  Mom stated we can mail her a copy of this and she will forward to her work if need be. Please advise   PRESCRIPTION REFILL ONLY  Name of prescription:  Pharmacy:

## 2018-06-23 NOTE — Telephone Encounter (Signed)
Amoxicillin sent in to [facility]

## 2018-06-23 NOTE — Telephone Encounter (Signed)
Patient's [name] called wanting Joanna Robinson to call in an antibiotic and cough medicine for the patient. She states for the past 2 days, patient has had a sore throat, cough and headache. She denies any fever or shortness of breath. Joanna Robinson states that [place] usually prescribes something for her since she has this every other month. She is willing to do an e visit, but will not be available until  3pm today or any tomorrow. [facility]acy: [facility]

## 2018-07-15 ENCOUNTER — Other Ambulatory Visit: Payer: Self-pay | Admitting: Physician Assistant

## 2018-07-15 DIAGNOSIS — R609 Edema, unspecified: Secondary | ICD-10-CM

## 2018-07-20 ENCOUNTER — Telehealth (INDEPENDENT_AMBULATORY_CARE_PROVIDER_SITE_OTHER): Payer: Self-pay | Admitting: Family

## 2018-07-20 NOTE — Telephone Encounter (Addendum)
Sharion Dove, mother of patient, called to request updated letter for her employer for staying at home with [name] during Covid 19 restrictions since [employer] services are unavailable during this time as well. She asked for the letter to be emailed to the school principal at [email or web address]. I will email the letter as requested and mail a copy to Mom. TG

## 2018-08-11 ENCOUNTER — Other Ambulatory Visit (INDEPENDENT_AMBULATORY_CARE_PROVIDER_SITE_OTHER): Payer: Self-pay | Admitting: Family

## 2018-08-11 DIAGNOSIS — G40209 Localization-related (focal) (partial) symptomatic epilepsy and epileptic syndromes with complex partial seizures, not intractable, without status epilepticus: Secondary | ICD-10-CM

## 2018-08-25 ENCOUNTER — Telehealth: Payer: Self-pay

## 2018-08-25 DIAGNOSIS — R059 Cough, unspecified: Secondary | ICD-10-CM

## 2018-08-25 DIAGNOSIS — J069 Acute upper respiratory infection, unspecified: Secondary | ICD-10-CM

## 2018-08-25 DIAGNOSIS — R05 Cough: Secondary | ICD-10-CM

## 2018-08-25 NOTE — Telephone Encounter (Signed)
Patient's mother [name] is requesting a RX for antibiotic and cough medication be sent to [facility]. [name] states Anila has a recurrent sore throat and cough. She states Antony Contras normally sends a RX to pharmacy for patient without being seen. Please advise.

## 2018-08-26 MED ORDER — PSEUDOEPH-BROMPHEN-DM 30-2-10 MG/5ML PO SYRP: 5.0000 mL | ORAL_SOLUTION | Freq: Four times a day (QID) | ORAL | 0 refills | Status: DC | PRN

## 2018-08-26 MED ORDER — AMOXICILLIN 875 MG PO TABS: 875.0000 mg | ORAL_TABLET | Freq: Two times a day (BID) | ORAL | 0 refills | Status: DC

## 2018-08-26 NOTE — Addendum Note (Signed)
Addended by: Margaretann Loveless on: 08/26/2018 08:25 AM   Modules accepted: Orders

## 2018-08-26 NOTE — Telephone Encounter (Signed)
And amoxil

## 2018-08-26 NOTE — Telephone Encounter (Signed)
Sent in [place] [facility]

## 2018-09-11 ENCOUNTER — Other Ambulatory Visit: Payer: Self-pay | Admitting: Family Medicine

## 2018-09-13 ENCOUNTER — Other Ambulatory Visit: Payer: Self-pay | Admitting: Physician Assistant

## 2018-09-13 DIAGNOSIS — K5900 Constipation, unspecified: Secondary | ICD-10-CM

## 2018-09-16 NOTE — Progress Notes (Signed)
Patient: Joanna Robinson Female    DOB: November 14, 1980   38 y.o.   MRN: [id number] Visit Date: 09/17/2018  Today's Provider: Margaretann LovelessJennifer M Trinaty Bundrick, PA-C   Chief Complaint  Patient presents with  . Abdominal Pain   Subjective:     Abdominal Pain This is a new problem. Episode onset: 2 days ago. The onset quality is sudden. The problem occurs constantly. The problem has been unchanged. The pain is located in the generalized abdominal region. Associated symptoms include constipation. Treatments tried: Miralax  The treatment provided no relief.   Per mother over the last 2 days [name] has complained of her stomach hurting and holds the area in the epigastric region when it is bothering her. [employer] also will lean over in a crouched position during these times and has also gotten up to go to the bathroom. She will sit on the commode without having a BM, or will have a small, hard BM. She has asked for her Miralax during the episodes and has also asked for Tums.   Allergies  Allergen Reactions  . Codeine   . Corticosteroids Nausea Only     Current Outpatient Medications:  .  acetaZOLAMIDE (DIAMOX) 250 MG tablet, TAKE 1 TABLET BY MOUTH TWICE DAILY, Disp: 60 tablet, Rfl: 5 .  Alpha-D-Galactosidase (BEANO) TABS, Take by mouth as needed (2-3 Tabs as needed prior to meal that may cause bloating.)., Disp: , Rfl:  .  Aluminum & Magnesium Hydroxide (MYLANTA ULTIMATE STRENGTH) 500-500 MG/5ML SUSP, MYLANTA ULTIMATE STRENGTH, 500-500MG /5ML (Oral Suspension)  1 TID as needed for 0 days  Quantity: 0.00;  Refills: 0   Ordered :14-Mar-2010  Denna HaggardAleksandrova, MA, Anastasiya ;  Started 19-October-2008 Active Comments: Medication taken as needed. , Disp: , Rfl:  .  Azelastine-Fluticasone 137-50 MCG/ACT SUSP, USE SPRAY INTO EACH NOSTRIL TWICE DAILY, Disp: 23 g, Rfl: 3 .  benzoyl peroxide-erythromycin (BENZAMYCIN) gel, Apply 2 (two) times daily topically., Disp: 46.6 g, Rfl: 5 .   brompheniramine-pseudoephedrine-DM 30-2-10 MG/5ML syrup, Take 5 mLs by mouth 4 (four) times daily as needed., Disp: 120 mL, Rfl: 0 .  CARBATROL 200 MG 12 hr capsule, TAKE 2 CAPSULES BY MOUTH ONCE EVERY MORNING AND 1 CAPSULE ONCE EVERY EVENING, Disp: 90 capsule, Rfl: 5 .  CARBATROL 300 MG 12 hr capsule, TAKE 1 CAPSULE BY MOUTH AT BEDTIME, Disp: 30 capsule, Rfl: 5 .  cloNIDine (CATAPRES) 0.2 MG tablet, TAKE 1 TABLET BY MOUTH AT BEDTIME, Disp: 90 tablet, Rfl: 1 .  Dextromethorphan-Menthol (DELSYM COUGH RELIEF MT), Use as directed in the mouth or throat as needed (2 Tsp. by mouth every 12 hours as needed for cough.). Reported on 04/07/2015, Disp: , Rfl:  .  fexofenadine (ALLEGRA ALLERGY) 180 MG tablet, Take 1 tablet (180 mg total) by mouth daily., Disp: 90 tablet, Rfl: 3 .  fluvoxaMINE (LUVOX) 100 MG tablet, TAKE 1 TABLET BY MOUTH TWICE DAILY, Disp: 180 tablet, Rfl: 1 .  furosemide (LASIX) 20 MG tablet, Take 1 tablet by mouth daily., Disp: , Rfl:  .  GAVILAX 17 GM/SCOOP powder, MIX 17 GRAMS IN 4 TO 8 OZ OF FLUID AND DRINK AT BEDTIME, Disp: 527 g, Rfl: 3 .  hydrochlorothiazide (HYDRODIURIL) 25 MG tablet, TAKE 1 TABLET BY MOUTH ONCE DAILY, Disp: 90 tablet, Rfl: 1 .  hydrocortisone (ANUSOL-HC) 2.5 % rectal cream, Place 1 application rectally 2 (two) times daily., Disp: , Rfl:  .  MAPAP 500 MG tablet, TAKE 2 TABLETS BY MOUTH 3 TIMES DAILY  ASNEEDED HEADACHE, Disp: 60 tablet, Rfl: 5 .  montelukast (SINGULAIR) 10 MG tablet, TAKE 1 TABLET BY MOUTH ONCE DAILY, Disp: 90 tablet, Rfl: 3 .  naproxen (NAPROSYN) 500 MG tablet, TAKE 1 TABLET BY MOUTH TWICE DAILY, Disp: 180 tablet, Rfl: 1 .  omega-3 acid ethyl esters (LOVAZA) 1 g capsule, Take 1 capsule (1 g total) by mouth daily., Disp: 90 capsule, Rfl: 1 .  potassium chloride (KLOR-CON) 20 MEQ packet, DISSOLVE 1 PACKET IN 4 OZ OF LIQUID AND DRINK ONCE DAILY, Disp: 90 packet, Rfl: 1 .  sodium fluoride (DENTA 5000 PLUS) 1.1 % CREA dental cream, Place 1 application onto  teeth every evening. Brush teeth daily for 1 minute, rinse and spit., Disp: 1 Tube, Rfl: 5 .  sulfacetamide (BLEPH-10) 10 % ophthalmic solution, Apply two drops to right eye 4 x day., Disp: 15 mL, Rfl: 5 .  Sunscreens (AVEENO CLEAR COMPLEXION BB) CREA, Apply as directed bid, Disp: 75 mL, Rfl: 11 .  TRIAMCINOLONE ACETONIDE, TOP, 0.05 % OINT, Apply to rash as needed TID., Disp: 85 g, Rfl: 5 .  Witch Hazel (TUCKS) 50 % PADS, Apply 50 % topically as needed (As needed for cleansing and hemorrhoid discomfort.)., Disp: , Rfl:   Review of Systems  Constitutional: Negative.   Respiratory: Negative.   Cardiovascular: Negative.   Gastrointestinal: Positive for abdominal distention, abdominal pain and constipation.  Genitourinary: Negative.   Musculoskeletal: Negative.     Social History   Tobacco Use  . Smoking status: Never Smoker  . Smokeless tobacco: Never Used  Substance Use Topics  . Alcohol use: No      Objective:   BP 95/70 (BP Location: Left Arm, Patient Position: Sitting, Cuff Size: Normal)   Pulse 76   Temp 99.4 F (37.4 C) (Oral)  Vitals:   09/17/18 0839  BP: 95/70  Pulse: 76  Temp: 99.4 F (37.4 C)  TempSrc: Oral     Physical Exam Constitutional:      General: She is not in acute distress.    Appearance: Normal appearance. She is well-developed and normal weight. She is not ill-appearing or diaphoretic.  Cardiovascular:     Rate and Rhythm: Normal rate and regular rhythm.     Heart sounds: Normal heart sounds. No murmur. No friction rub. No gallop.   Pulmonary:     Effort: Pulmonary effort is normal. No respiratory distress.     Breath sounds: Normal breath sounds. No wheezing or rales.  Abdominal:     General: Bowel sounds are normal. There is no distension.     Palpations: Abdomen is soft. There is no mass.     Tenderness: There is abdominal tenderness in the epigastric area. There is no right CVA tenderness, left CVA tenderness, guarding or rebound. Negative  signs include Murphy's sign and McBurney's sign.  Skin:    General: Skin is warm and dry.  Neurological:     Mental Status: She is alert and oriented to person, place, and time.     No results found for any visits on 09/17/18.     Assessment & Plan    1. Gastroesophageal reflux disease without esophagitis Will try omeprazole as below x 14 days. Call if not improving or if she complains of stomach pain more frequently.  - omeprazole (PRILOSEC) 40 MG capsule; Take 1 capsule (40 mg total) by mouth daily.  Dispense: 30 capsule; Refill: 3  2. Chronic idiopathic constipation Will stop Miralax for now. Samples of Linzess 145mcg were given  to patient to try for 8 days. Call if adverse effects or ineffective. I will re-evaluate her in 2 weeks.      Mar Daring, PA-C  [facility]

## 2018-09-17 ENCOUNTER — Other Ambulatory Visit: Payer: Self-pay

## 2018-09-17 ENCOUNTER — Ambulatory Visit (INDEPENDENT_AMBULATORY_CARE_PROVIDER_SITE_OTHER): Payer: Medicare Other | Admitting: Physician Assistant

## 2018-09-17 ENCOUNTER — Encounter: Payer: Self-pay | Admitting: Physician Assistant

## 2018-09-17 VITALS — BP 95/70 | HR 76 | Temp 99.4°F

## 2018-09-17 DIAGNOSIS — K5904 Chronic idiopathic constipation: Secondary | ICD-10-CM

## 2018-09-17 DIAGNOSIS — K219 Gastro-esophageal reflux disease without esophagitis: Secondary | ICD-10-CM

## 2018-09-17 MED ORDER — OMEPRAZOLE 40 MG PO CPDR: 40.0000 mg | DELAYED_RELEASE_CAPSULE | Freq: Every day | ORAL | 3 refills | Status: DC

## 2018-09-17 NOTE — Patient Instructions (Signed)
Omeprazole tablets (OTC) What is this medicine? OMEPRAZOLE (oh ME pray zol) prevents the production of acid in the stomach. It is used to treat the symptoms of heartburn. You can buy this medicine without a prescription. This product is not for long-term use, unless otherwise directed by your doctor or health care professional. This medicine may be used for other purposes; ask your health care provider or pharmacist if you have questions. COMMON BRAND NAME(S): Prilosec OTC What should I tell my health care provider before I take this medicine? They need to know if you have any of these conditions: -black or bloody stools -chest pain -difficulty swallowing -have had heartburn for over 3 months -have heartburn with dizziness, lightheadedness or sweating -liver disease -lupus -stomach pain -unexplained weight loss -vomiting with blood -wheezing -an unusual or allergic reaction to omeprazole, other medicines, foods, dyes, or preservatives -pregnant or trying to get pregnant -breast-feeding How should I use this medicine? Take this medicine by mouth. Follow the directions on the product label. If you are taking this medicine without a prescription, take one tablet every day. Do not use for longer than 14 days or repeat a course of treatment more often than every 4 months unless directed by a doctor or healthcare professional. Take your dose at regular intervals every 24 hours. Swallow the tablet whole with a drink of water. Do not crush, break or chew. This medicine works best if taken on an empty stomach 30 minutes before breakfast. If you are using this medicine with the prescription of your doctor or healthcare professional, follow the directions you were given. Do not take your medicine more often than directed. Talk to your pediatrician regarding the use of this medicine in children. Special care may be needed. Overdosage: If you think you have taken too much of this medicine contact a poison  control center or emergency room at once. NOTE: This medicine is only for you. Do not share this medicine with others. What if I miss a dose? If you miss a dose, take it as soon as you can. If it is almost time for your next dose, take only that dose. Do not take double or extra doses. What may interact with this medicine? Do not take this medicine with any of the following medications: -atazanavir -clopidogrel -nelfinavir This medicine may also interact with the following medications: -ampicillin -certain medicines for anxiety or sleep -certain medicines that treat or prevent blood clots like warfarin -cyclosporine -diazepam -digoxin -disulfiram -iron salts -methotrexate -mycophenolate mofetil -phenytoin -prescription medicine for fungal or yeast infection like itraconazole, ketoconazole, voriconazole -saquinavir -tacrolimus This list may not describe all possible interactions. Give your health care provider a list of all the medicines, herbs, non-prescription drugs, or dietary supplements you use. Also tell them if you smoke, drink alcohol, or use illegal drugs. Some items may interact with your medicine. What should I watch for while using this medicine? It can take several days before your heartburn gets better. Check with your doctor or health care professional if your condition does not start to get better, or if it gets worse. Do not treat diarrhea with over the counter products. Contact your doctor if you have diarrhea that lasts more than 2 days or if it is severe and watery. Do not treat yourself for heartburn with this medicine for more than 14 days in a row. You should only use this medicine for a 2-week treatment period once every 4 months. If your symptoms return shortly after your   therapy is complete, or within the 4 month time frame, call your doctor or health care professional. This medicine may cause a decrease in vitamin B12. You should make sure that you get enough  vitamin B12 while you are taking this medicine. Discuss the foods you eat and the vitamins you take with your health care professional. What side effects may I notice from receiving this medicine? Side effects that you should report to your doctor or health care professional as soon as possible: - allergic reactions like skin rash, itching or hives, swelling of the face, lips, or tongue - bone, muscle or joint pain - breathing problems - chest pain or chest tightness - dark yellow or brown urine - diarrhea - dizziness - fast, irregular heartbeat - feeling faint or lightheaded - fever or sore throat - muscle spasm - palpitations - rash on cheeks or arms that gets worse in the sun - redness, blistering, peeling or loosening of the skin, including inside the mouth - seizures -stomach polyps - tremors - unusual bleeding or bruising - unusually weak or tired - yellowing of the eyes or skin Side effects that usually do not require medical attention (report to your doctor or health care professional if they continue or are bothersome): - constipation - dry mouth - headache - loose stools - nausea This list may not describe all possible side effects. Call your doctor for medical advice about side effects. You may report side effects to FDA at 1-800-FDA-1088. Where should I keep my medicine? Keep out of the reach of children. Store at room temperature between 20 and 25 degrees C (68 and 77 degrees F). Protect from light and moisture. Throw away any unused medicine after the expiration date. NOTE: This sheet is a summary. It may not cover all possible information. If you have questions about this medicine, talk to your doctor, pharmacist, or health care provider.  2019 Elsevier/Gold Standard (2016-10-24 13:29:07) Linaclotide oral capsules What is this medicine? LINACLOTIDE (lin a [name] tide) is used to treat irritable bowel syndrome (IBS) with constipation as the  main problem. It may also be used for relief of chronic constipation. This medicine may be used for other purposes; ask your health care provider or pharmacist if you have questions. COMMON BRAND NAME(S): Linzess What should I tell my health care provider before I take this medicine? They need to know if you have any of these conditions: -history of stool (fecal) impaction -now have diarrhea or have diarrhea often -other medical condition -stomach or intestinal disease, including bowel obstruction or abdominal adhesions -an unusual or allergic reaction to linaclotide, other medicines, foods, dyes, or preservatives -pregnant or trying to get pregnant -breast-feeding How should I use this medicine? Take this medicine by mouth with a glass of water. Follow the directions on the prescription label. Do not cut, crush or chew this medicine. Take on an empty stomach, at least 30 minutes before your first meal of the day. Take your medicine at regular intervals. Do not take your medicine more often than directed. Do not stop taking except on your doctor's advice. A special MedGuide will be given to you by the pharmacist with each prescription and refill. Be sure to read this information carefully each time. Talk to your pediatrician regarding the use of this medicine in children. This medicine is not approved for use in children. Overdosage: If you think you have taken too much of this medicine contact a poison control center or emergency room at once.  NOTE: This medicine is only for you. Do not share this medicine with others. What if I miss a dose? If you miss a dose, just skip that dose. Wait until your next dose, and take only that dose. Do not take double or extra doses. What may interact with this medicine? -certain medicines for bowel problems or bladder incontinence (these can cause constipation) This list may not describe all possible interactions. Give your health care provider a list of all  the medicines, herbs, non-prescription drugs, or dietary supplements you use. Also tell them if you smoke, drink alcohol, or use illegal drugs. Some items may interact with your medicine. What should I watch for while using this medicine? Visit your doctor for regular check ups. Tell your doctor if your symptoms do not get better or if they get worse. Diarrhea is a common side effect of this medicine. It often begins within 2 weeks of starting this medicine. Stop taking this medicine and call your doctor if you get severe diarrhea. Stop taking this medicine and call your doctor or go to the nearest hospital emergency room right away if you develop unusual or severe stomach-area (abdominal) pain, especially if you also have bright red, bloody stools or black stools that look like tar. What side effects may I notice from receiving this medicine? Side effects that you should report to your doctor or health care professional as soon as possible: -allergic reactions like skin rash, itching or hives, swelling of the face, lips, or tongue -black, tarry stools -bloody or watery diarrhea -new or worsening stomach pain -severe or prolonged diarrhea Side effects that usually do not require medical attention (report to your doctor or health care professional if they continue or are bothersome): -bloating -gas -loose stools This list may not describe all possible side effects. Call your doctor for medical advice about side effects. You may report side effects to FDA at 1-800-FDA-1088. Where should I keep my medicine? Keep out of the reach of children. Store at room temperature between 20 and 25 degrees C (68 and 77 degrees F). Keep this medicine in the original container. Keep tightly closed in a dry place. Do not remove the desiccant packet from the bottle, it helps to protect your medicine from moisture. Throw away any unused medicine after the expiration date. NOTE: This sheet is a summary. It may not  cover all possible information. If you have questions about this medicine, talk to your doctor, pharmacist, or health care provider.  2019 Elsevier/Gold Standard (2015-04-12 12:17:04)

## 2018-09-20 ENCOUNTER — Telehealth: Payer: Self-pay | Admitting: Physician Assistant

## 2018-09-20 NOTE — Telephone Encounter (Signed)
pt was in Friday and given Linzess for constipation.  Mom called saying she has still has not had a BM.  She was told to call back if she did not.    CB# [phone]  Thanks Con Memos

## 2018-09-20 NOTE — Telephone Encounter (Signed)
Patient's mother advised as directed below. 

## 2018-09-20 NOTE — Telephone Encounter (Signed)
Take 2 of the linzess daily

## 2018-09-22 ENCOUNTER — Telehealth: Payer: Self-pay

## 2018-09-22 DIAGNOSIS — K5904 Chronic idiopathic constipation: Secondary | ICD-10-CM

## 2018-09-22 MED ORDER — LINACLOTIDE 290 MCG PO CAPS: 290.0000 ug | ORAL_CAPSULE | Freq: Every day | ORAL | 1 refills | Status: DC

## 2018-09-22 NOTE — Telephone Encounter (Signed)
I will send Rx for her. She can come pick up samples of the 225mcg (would only need one then) to last her through the weekend since it is a holiday week. She does need to keep taking this. This is something she should take daily to keep BM coming, unless they become watery then she could hold for a day or two.

## 2018-09-22 NOTE — Telephone Encounter (Signed)
Patient advised as directed below. 

## 2018-09-22 NOTE — Telephone Encounter (Signed)
Patient mother calling that she was able to go to the bathroom after taking 2 of the [place] and was asking if a prescription can be send in for patient or does she needs to continue taking it or if she needs more samples.

## 2018-09-29 ENCOUNTER — Encounter: Payer: Self-pay | Admitting: Physician Assistant

## 2018-09-29 ENCOUNTER — Other Ambulatory Visit: Payer: Self-pay

## 2018-09-29 ENCOUNTER — Ambulatory Visit (INDEPENDENT_AMBULATORY_CARE_PROVIDER_SITE_OTHER): Payer: Medicare Other | Admitting: Physician Assistant

## 2018-09-29 VITALS — BP 104/72 | HR 74 | Temp 99.1°F | Resp 16 | Ht 68.0 in | Wt 188.2 lb

## 2018-09-29 DIAGNOSIS — G40909 Epilepsy, unspecified, not intractable, without status epilepticus: Secondary | ICD-10-CM | POA: Diagnosis not present

## 2018-09-29 DIAGNOSIS — E876 Hypokalemia: Secondary | ICD-10-CM | POA: Diagnosis not present

## 2018-09-29 DIAGNOSIS — Z6828 Body mass index (BMI) 28.0-28.9, adult: Secondary | ICD-10-CM

## 2018-09-29 DIAGNOSIS — E7800 Pure hypercholesterolemia, unspecified: Secondary | ICD-10-CM

## 2018-09-29 DIAGNOSIS — R7889 Finding of other specified substances, not normally found in blood: Secondary | ICD-10-CM

## 2018-09-29 DIAGNOSIS — F840 Autistic disorder: Secondary | ICD-10-CM | POA: Diagnosis not present

## 2018-09-29 DIAGNOSIS — Z0000 Encounter for general adult medical examination without abnormal findings: Secondary | ICD-10-CM | POA: Diagnosis not present

## 2018-09-29 DIAGNOSIS — D709 Neutropenia, unspecified: Secondary | ICD-10-CM | POA: Diagnosis not present

## 2018-09-29 DIAGNOSIS — Z79899 Other long term (current) drug therapy: Secondary | ICD-10-CM | POA: Diagnosis not present

## 2018-09-29 NOTE — Progress Notes (Signed)
Patient: Joanna Robinson, Female    DOB: [birth year], 38 y.o.   MRN: [id number] Visit Date: 09/29/2018  Today's Provider: Margaretann LovelessJennifer M Robinson Signer, PA-C   Chief Complaint  Patient presents with  . Annual Exam   Subjective:     Annual physical exam Joanna Robinson is a 38 y.o. female who presents today for health maintenance and complete physical. She feels well. She reports exercising none. She reports she is sleeping well. -----------------------------------------------------------------   Review of Systems  Constitutional: Negative.   HENT: Positive for sinus pressure and sneezing.   Eyes: Positive for itching.  Respiratory: Negative.   Cardiovascular: Negative.   Gastrointestinal: Negative.   Endocrine: Negative.   Genitourinary: Negative.   Musculoskeletal: Negative.   Skin: Negative.   Allergic/Immunologic: Negative.   Neurological: Negative.   Hematological: Negative.   Psychiatric/Behavioral: Negative.     Social History      She  reports that she has never smoked. She has never used smokeless tobacco. She reports that she does not drink alcohol or use drugs.       Social History   Socioeconomic History  . Marital status: Single    Spouse name: Not on file  . Number of children: Not on file  . Years of education: Not on file  . Highest education level: Not on file  Occupational History  . Not on file  Social Needs  . Financial resource strain: Not on file  . Food insecurity    Worry: Not on file    Inability: Not on file  . Transportation needs    Medical: Not on file    Non-medical: Not on file  Tobacco Use  . Smoking status: Never Smoker  . Smokeless tobacco: Never Used  Substance and Sexual Activity  . Alcohol use: No  . Drug use: No  . Sexual activity: Never  Lifestyle  . Physical activity    Days per week: Not on file    Minutes per session: Not on file  . Stress: Not on file  Relationships  . Social Musicianconnections    Talks on phone:  Not on file    Gets together: Not on file    Attends religious service: Not on file    Active member of club or organization: Not on file    Attends meetings of clubs or organizations: Not on file    Relationship status: Not on file  Other Topics Concern  . Not on file  Social History Narrative   [name] is a high Garment[employer]text[employer] graduate. She has a nurse that comes to her home and they go out on field trips. She enjoys exercise, dance, and riding bike. She lives with her mother.    Past Medical History:  Diagnosis Date  . Autism   . Epilepsy (HCC)   . Hemorrhoid   . Hyperlipidemia   . Seizures [facility](HCC)      Patient Active Problem List   Diagnosis Date Noted  . Abnormal blood chemistry 12/20/2014  . Abscess of axilla 12/20/2014  . Dry eye syndrome 12/20/2014  . Dermoid inclusion cyst 12/20/2014  . Hematoma 12/20/2014  . HLD (hyperlipidemia) 12/20/2014  . Decreased potassium in the blood 12/20/2014  . Irregular bleeding 12/20/2014  . Malaise and fatigue 12/20/2014  . Seizure (HCC) 12/20/2014  . Headache 12/18/2014  . Acne 10/30/2014  . Constipation 09/18/2014  . Neutropenia (HCC) 09/18/2014  . Elevated carbamazepine level 01/25/2014  . Active autistic disorder 01/10/2013  .  Long-term use of high-risk medication 01/10/2013  . Dysmenorrhea 07/10/2008  . Mechanical and motor problems with internal organs 11/08/2007  . Allergic rhinitis 08/20/2007  . External hemorrhoids without complication 11/13/2006  . Edema 12/26/2003  . Epilepsy, not refractory (HCC) 12/26/2003  . Intellectual disability 12/26/2003    Past Surgical History:  Procedure Laterality Date  . NO PAST SURGERIES      Family History        Family Status  Relation Name Status  . Mother  Alive  . Father  Alive  . Sister  Alive       younger  . MGM  Deceased  . MGF  Alive  . PGM  Alive  . PGF  Deceased        Her family history includes Alzheimer's disease in her maternal grandmother and paternal  grandfather.      Allergies  Allergen Reactions  . Codeine   . Corticosteroids Nausea Only     Current Outpatient Medications:  .  acetaZOLAMIDE (DIAMOX) 250 MG tablet, TAKE 1 TABLET BY MOUTH TWICE DAILY, Disp: 60 tablet, Rfl: 5 .  Alpha-D-Galactosidase (BEANO) TABS, Take by mouth as needed (2-3 Tabs as needed prior to meal that may cause bloating.)., Disp: , Rfl:  .  Aluminum & Magnesium Hydroxide (MYLANTA ULTIMATE STRENGTH) 500-500 MG/5ML SUSP, MYLANTA ULTIMATE STRENGTH, 500-500MG /5ML (Oral Suspension)  1 TID as needed for 0 days  Quantity: 0.00;  Refills: 0   Ordered :14-Mar-2010  Denna HaggardAleksandrova, MA, Anastasiya ;  Started 19-October-2008 Active Comments: Medication taken as needed. , Disp: , Rfl:  .  Azelastine-Fluticasone 137-50 MCG/ACT SUSP, USE SPRAY INTO EACH NOSTRIL TWICE DAILY, Disp: 23 g, Rfl: 3 .  benzoyl peroxide-erythromycin (BENZAMYCIN) gel, Apply 2 (two) times daily topically., Disp: 46.6 g, Rfl: 5 .  CARBATROL 200 MG 12 hr capsule, TAKE 2 CAPSULES BY MOUTH ONCE EVERY MORNING AND 1 CAPSULE ONCE EVERY EVENING, Disp: 90 capsule, Rfl: 5 .  CARBATROL 300 MG 12 hr capsule, TAKE 1 CAPSULE BY MOUTH AT BEDTIME, Disp: 30 capsule, Rfl: 5 .  cloNIDine (CATAPRES) 0.2 MG tablet, TAKE 1 TABLET BY MOUTH AT BEDTIME, Disp: 90 tablet, Rfl: 1 .  Dextromethorphan-Menthol (DELSYM COUGH RELIEF MT), Use as directed in the mouth or throat as needed (2 Tsp. by mouth every 12 hours as needed for cough.). Reported on 04/07/2015, Disp: , Rfl:  .  fexofenadine (ALLEGRA ALLERGY) 180 MG tablet, Take 1 tablet (180 mg total) by mouth daily., Disp: 90 tablet, Rfl: 3 .  fluvoxaMINE (LUVOX) 100 MG tablet, TAKE 1 TABLET BY MOUTH TWICE DAILY, Disp: 180 tablet, Rfl: 1 .  furosemide (LASIX) 20 MG tablet, Take 1 tablet by mouth daily., Disp: , Rfl:  .  GAVILAX 17 GM/SCOOP powder, MIX 17 GRAMS IN 4 TO 8 OZ OF FLUID AND DRINK AT BEDTIME, Disp: 527 g, Rfl: 3 .  hydrochlorothiazide (HYDRODIURIL) 25 MG tablet, TAKE 1 TABLET BY  MOUTH ONCE DAILY, Disp: 90 tablet, Rfl: 1 .  hydrocortisone (ANUSOL-HC) 2.5 % rectal cream, Place 1 application rectally 2 (two) times daily., Disp: , Rfl:  .  linaclotide (LINZESS) 290 MCG CAPS capsule, Take 1 capsule (290 mcg total) by mouth daily before breakfast., Disp: 90 capsule, Rfl: 1 .  MAPAP 500 MG tablet, TAKE 2 TABLETS BY MOUTH 3 TIMES DAILY ASNEEDED HEADACHE, Disp: 60 tablet, Rfl: 5 .  montelukast (SINGULAIR) 10 MG tablet, TAKE 1 TABLET BY MOUTH ONCE DAILY, Disp: 90 tablet, Rfl: 3 .  naproxen (NAPROSYN) 500 MG  tablet, TAKE 1 TABLET BY MOUTH TWICE DAILY, Disp: 180 tablet, Rfl: 1 .  omega-3 acid ethyl esters (LOVAZA) 1 g capsule, Take 1 capsule (1 g total) by mouth daily., Disp: 90 capsule, Rfl: 1 .  omeprazole (PRILOSEC) 40 MG capsule, Take 1 capsule (40 mg total) by mouth daily., Disp: 30 capsule, Rfl: 3 .  potassium chloride (KLOR-CON) 20 MEQ packet, DISSOLVE 1 PACKET IN 4 OZ OF LIQUID AND DRINK ONCE DAILY, Disp: 90 packet, Rfl: 1 .  sodium fluoride (DENTA 5000 PLUS) 1.1 % CREA dental cream, Place 1 application onto teeth every evening. Brush teeth daily for 1 minute, rinse and spit., Disp: 1 Tube, Rfl: 5 .  sulfacetamide (BLEPH-10) 10 % ophthalmic solution, Apply two drops to right eye 4 x day., Disp: 15 mL, Rfl: 5 .  Sunscreens (AVEENO CLEAR COMPLEXION BB) CREA, Apply as directed bid, Disp: 75 mL, Rfl: 11 .  TRIAMCINOLONE ACETONIDE, TOP, 0.05 % OINT, Apply to rash as needed TID., Disp: 85 g, Rfl: 5 .  Witch Hazel (TUCKS) 50 % PADS, Apply 50 % topically as needed (As needed for cleansing and hemorrhoid discomfort.)., Disp: , Rfl:  .  brompheniramine-pseudoephedrine-DM 30-2-10 MG/5ML syrup, Take 5 mLs by mouth 4 (four) times daily as needed. (Patient not taking: Reported on 09/29/2018), Disp: 120 mL, Rfl: 0   Patient Care Team: Mar Daring, PA-C as PCP - General (Family Medicine) Christene Lye, MD (General Surgery) Margarita Rana, MD as Referring Physician (Family  Medicine)    Objective:    Vitals: BP 104/72 (BP Location: Left Arm, Patient Position: Sitting, Cuff Size: Large)   Pulse 74   Temp 99.1 F (37.3 C) (Oral)   Resp 16   Ht 5\' 8"  (1.727 m)   Wt 188 lb 3.2 oz (85.4 kg)   LMP 09/09/2018   BMI 28.62 kg/m    Vitals:   09/29/18 1102  BP: 104/72  Pulse: 74  Resp: 16  Temp: 99.1 F (37.3 C)  TempSrc: Oral  Weight: 188 lb 3.2 oz (85.4 kg)  Height: 5\' 8"  (1.727 m)     Physical Exam Vitals signs reviewed.  Constitutional:      General: She is not in acute distress.    Appearance: Normal appearance. She is well-developed and normal weight. She is not ill-appearing or diaphoretic.  HENT:     Head: Normocephalic and atraumatic.     Right Ear: Tympanic membrane, ear canal and external ear normal.     Left Ear: Tympanic membrane, ear canal and external ear normal.     Nose: Nose normal.     Mouth/Throat:     Mouth: Mucous membranes are moist.     Pharynx: No oropharyngeal exudate or posterior oropharyngeal erythema.  Eyes:     General: No scleral icterus.       Right eye: No discharge.        Left eye: No discharge.     Extraocular Movements: Extraocular movements intact.     Conjunctiva/sclera: Conjunctivae normal.     Pupils: Pupils are equal, round, and reactive to light.  Neck:     Musculoskeletal: Normal range of motion and neck supple.     Thyroid: No thyromegaly.     Vascular: No JVD.     Trachea: No tracheal deviation.  Cardiovascular:     Rate and Rhythm: Normal rate and regular rhythm.     Pulses: Normal pulses.     Heart sounds: Normal heart sounds. No murmur. No friction  rub. No gallop.   Pulmonary:     Effort: Pulmonary effort is normal. No respiratory distress.     Breath sounds: Normal breath sounds. No wheezing or rales.  Chest:     Chest wall: No tenderness.  Abdominal:     General: Bowel sounds are normal. There is no distension.     Palpations: Abdomen is soft. There is no mass.     Tenderness: There  is no abdominal tenderness. There is no guarding or rebound.  Musculoskeletal: Normal range of motion.        General: No tenderness.  Lymphadenopathy:     Cervical: No cervical adenopathy.  Skin:    General: Skin is warm and dry.     Capillary Refill: Capillary refill takes less than 2 seconds.     Findings: No rash.  Neurological:     General: No focal deficit present.     Mental Status: She is alert. Mental status is at baseline.  Psychiatric:        Mood and Affect: Mood normal.        Behavior: Behavior normal.        Thought Content: Thought content normal.        Judgment: Judgment normal.      Depression Screen PHQ 2/9 Scores 09/29/2018  PHQ - 2 Score 0       Assessment & Plan:     Routine Health Maintenance and Physical Exam  Exercise Activities and Dietary recommendations Goals   None     Immunization History  Administered Date(s) Administered  . Influenza,inj,Quad PF,6+ Mos 01/07/2013, 01/05/2018  . Td 12/20/2004  . Tdap 08/16/2010    Health Maintenance  Topic Date Due  . HIV Screening  10/15/1997  . PAP SMEAR-Modifier  10/16/2003  . INFLUENZA VACCINE  10/23/2018  . TETANUS/TDAP  08/15/2020     Discussed health benefits of physical activity, and encouraged her to engage in regular exercise appropriate for her age and condition.    1. Annual physical exam Normal exam today. Up to date on screenings and vaccinations.   2. Epilepsy, not refractory (HCC) Stable. Most seizures occur with menstrual cycle, but has not had one in many months per mom. Followed by Neurology. Will check labs as below and f/u pending results. - CBC w/Diff/Platelet - Comprehensive Metabolic Panel (CMET) - Lipid Profile - TSH - HgB A1c - Carbamazepine, Free and Total  3. Active autistic disorder Stable.  - Comprehensive Metabolic Panel (CMET)  4. Decreased potassium in the blood Will check labs as below and f/u pending results. - Comprehensive Metabolic Panel  (CMET)  5. Elevated carbamazepine level H/O this and tegretol dose adjusted. Will check labs as below and f/u pending results.  6. Pure hypercholesterolemia Diet controlled. Will check labs as below and f/u pending results. - Comprehensive Metabolic Panel (CMET) - Lipid Profile - HgB A1c  7. Long-term use of high-risk medication Will check labs as below and f/u pending results. - Comprehensive Metabolic Panel (CMET) - Lipid Profile - TSH - HgB A1c - Carbamazepine, Free and Total  8. Neutropenia, unspecified type (HCC) H/O this. Will check labs as below and f/u pending results. - CBC w/Diff/Platelet  9. BMI 28.0-28.9,adult Counseled patient on healthy lifestyle modifications including dieting and exercise.  - Comprehensive Metabolic Panel (CMET) - Lipid Profile - TSH - HgB A1c  --------------------------------------------------------------------    Joanna LovelessJennifer M Zarayah Lanting, PA-C  [facility]

## 2018-09-29 NOTE — Patient Instructions (Signed)
Health Maintenance, Female Adopting a healthy lifestyle and getting preventive care are important in promoting health and wellness. Ask your health care provider about:  The right schedule for you to have regular tests and exams.  Things you can do on your own to prevent diseases and keep yourself healthy. What should I know about diet, weight, and exercise? Eat a healthy diet   Eat a diet that includes plenty of vegetables, fruits, low-fat dairy products, and lean protein.  Do not eat a lot of foods that are high in solid fats, added sugars, or sodium. Maintain a healthy weight Body mass index (BMI) is used to identify weight problems. It estimates body fat based on height and weight. Your health care provider can help determine your BMI and help you achieve or maintain a healthy weight. Get regular exercise Get regular exercise. This is one of the most important things you can do for your health. Most adults should:  Exercise for at least 150 minutes each week. The exercise should increase your heart rate and make you sweat (moderate-intensity exercise).  Do strengthening exercises at least twice a week. This is in addition to the moderate-intensity exercise.  Spend less time sitting. Even light physical activity can be beneficial. Watch cholesterol and blood lipids Have your blood tested for lipids and cholesterol at 36 years of age, then have this test every 5 years. Have your cholesterol levels checked more often if:  Your lipid or cholesterol levels are high.  You are older than 36 years of age.  You are at high risk for heart disease. What should I know about cancer screening? Depending on your health history and family history, you may need to have cancer screening at various ages. This may include screening for:  Breast cancer.  Cervical cancer.  Colorectal cancer.  Skin cancer.  Lung cancer. What should I know about heart disease, diabetes, and high blood  pressure? Blood pressure and heart disease  High blood pressure causes heart disease and increases the risk of stroke. This is more likely to develop in people who have high blood pressure readings, are of African descent, or are overweight.  Have your blood pressure checked: ? Every 3-5 years if you are 18-39 years of age. ? Every year if you are 40 years old or older. Diabetes Have regular diabetes screenings. This checks your fasting blood sugar level. Have the screening done:  Once every three years after age 40 if you are at a normal weight and have a low risk for diabetes.  More often and at a younger age if you are overweight or have a high risk for diabetes. What should I know about preventing infection? Hepatitis B If you have a higher risk for hepatitis B, you should be screened for this virus. Talk with your health care provider to find out if you are at risk for hepatitis B infection. Hepatitis C Testing is recommended for:  Everyone born from 1945 through 1965.  Anyone with known risk factors for hepatitis C. Sexually transmitted infections (STIs)  Get screened for STIs, including gonorrhea and chlamydia, if: ? You are sexually active and are younger than 36 years of age. ? You are older than 36 years of age and your health care provider tells you that you are at risk for this type of infection. ? Your sexual activity has changed since you were last screened, and you are at increased risk for chlamydia or gonorrhea. Ask your health care provider if   you are at risk.  Ask your health care provider about whether you are at high risk for HIV. Your health care provider may recommend a prescription medicine to help prevent HIV infection. If you choose to take medicine to prevent HIV, you should first get tested for HIV. You should then be tested every 3 months for as long as you are taking the medicine. Pregnancy  If you are about to stop having your period (premenopausal) and  you may become pregnant, seek counseling before you get pregnant.  Take 400 to 800 micrograms (mcg) of folic acid every day if you become pregnant.  Ask for birth control (contraception) if you want to prevent pregnancy. Osteoporosis and menopause Osteoporosis is a disease in which the bones lose minerals and strength with aging. This can result in bone fractures. If you are 65 years old or older, or if you are at risk for osteoporosis and fractures, ask your health care provider if you should:  Be screened for bone loss.  Take a calcium or vitamin D supplement to lower your risk of fractures.  Be given hormone replacement therapy (HRT) to treat symptoms of menopause. Follow these instructions at home: Lifestyle  Do not use any products that contain nicotine or tobacco, such as cigarettes, e-cigarettes, and chewing tobacco. If you need help quitting, ask your health care provider.  Do not use street drugs.  Do not share needles.  Ask your health care provider for help if you need support or information about quitting drugs. Alcohol use  Do not drink alcohol if: ? Your health care provider tells you not to drink. ? You are pregnant, may be pregnant, or are planning to become pregnant.  If you drink alcohol: ? Limit how much you use to 0-1 drink a day. ? Limit intake if you are breastfeeding.  Be aware of how much alcohol is in your drink. In the [place] one drink equals one 12 oz bottle of beer (355 mL), one 5 oz glass of wine (148 mL), or one 1 oz glass of hard liquor (44 mL). General instructions  Schedule regular health, dental, and eye exams.  Stay current with your vaccines.  Tell your health care provider if: ? You often feel depressed. ? You have ever been abused or do not feel safe at home. Summary  Adopting a healthy lifestyle and getting preventive care are important in promoting health and wellness.  Follow your health care provider's instructions about healthy  diet, exercising, and getting tested or screened for diseases.  Follow your health care provider's instructions on monitoring your cholesterol and blood pressure. This information is not intended to replace advice given to you by your health care provider. Make sure you discuss any questions you have with your health care provider. Document Released: 09/23/2010 Document Revised: 03/03/2018 Document Reviewed: 03/03/2018 Elsevier Patient Education  2020 Elsevier Inc.  

## 2018-09-30 NOTE — Addendum Note (Signed)
Addended by: Mar Daring on: 09/30/2018 10:32 AM   Modules accepted: Level of Service

## 2018-10-01 DIAGNOSIS — D709 Neutropenia, unspecified: Secondary | ICD-10-CM | POA: Diagnosis not present

## 2018-10-01 DIAGNOSIS — Z79899 Other long term (current) drug therapy: Secondary | ICD-10-CM | POA: Diagnosis not present

## 2018-10-01 DIAGNOSIS — G40909 Epilepsy, unspecified, not intractable, without status epilepticus: Secondary | ICD-10-CM | POA: Diagnosis not present

## 2018-10-01 DIAGNOSIS — E7800 Pure hypercholesterolemia, unspecified: Secondary | ICD-10-CM | POA: Diagnosis not present

## 2018-10-01 DIAGNOSIS — F840 Autistic disorder: Secondary | ICD-10-CM | POA: Diagnosis not present

## 2018-10-01 DIAGNOSIS — E876 Hypokalemia: Secondary | ICD-10-CM | POA: Diagnosis not present

## 2018-10-01 DIAGNOSIS — Z6828 Body mass index (BMI) 28.0-28.9, adult: Secondary | ICD-10-CM | POA: Diagnosis not present

## 2018-10-04 ENCOUNTER — Telehealth (INDEPENDENT_AMBULATORY_CARE_PROVIDER_SITE_OTHER): Payer: Self-pay | Admitting: Family

## 2018-10-04 NOTE — Telephone Encounter (Signed)
I reviewed your note and agree with your assessment and advice to mother.

## 2018-10-04 NOTE — Telephone Encounter (Signed)
°  Who's calling (name and relationship to patient) : [name] (Mother) Best contact number: [phone] Provider they see: Otila Kluver Reason for call: Mom wanted to know what pt's Tegretol level was.

## 2018-10-04 NOTE — Telephone Encounter (Signed)
I called Mom and left a message that the level was 15.6 mcg/ml. It was 15.0 mcg/ml 6 months ago. When I talked with Mom earlier today she said that the level was drawn as a trough level. She said that Teola's level of alertness and seizure activity was unchanged. I asked Mom to let me know if that changed and recommended continuing the dose without change for now since she was asymptomatic. Mom agreed with this plan. TG

## 2018-10-05 LAB — CBC WITH DIFFERENTIAL/PLATELET
Basophils Absolute: 0 10*3/uL (ref 0.0–0.2)
Basos: 1 %
EOS (ABSOLUTE): 0.1 10*3/uL (ref 0.0–0.4)
Eos: 3 %
Hematocrit: 35.5 % (ref 34.0–46.6)
Hemoglobin: 11.1 g/dL (ref 11.1–15.9)
Immature Grans (Abs): 0 10*3/uL (ref 0.0–0.1)
Immature Granulocytes: 0 %
Lymphocytes Absolute: 0.8 10*3/uL (ref 0.7–3.1)
Lymphs: 27 %
MCH: 26.6 pg (ref 26.6–33.0)
MCHC: 31.3 g/dL — ABNORMAL LOW (ref 31.5–35.7)
MCV: 85 fL (ref 79–97)
Monocytes Absolute: 0.4 10*3/uL (ref 0.1–0.9)
Monocytes: 11 %
Neutrophils Absolute: 1.8 10*3/uL (ref 1.4–7.0)
Neutrophils: 58 %
Platelets: 190 10*3/uL (ref 150–450)
RBC: 4.17 x10E6/uL (ref 3.77–5.28)
RDW: 13 % (ref 11.7–15.4)
WBC: 3.1 10*3/uL — ABNORMAL LOW (ref 3.4–10.8)

## 2018-10-05 LAB — COMPREHENSIVE METABOLIC PANEL
ALT: 8 IU/L (ref 0–32)
AST: 15 IU/L (ref 0–40)
Albumin/Globulin Ratio: 1.4 (ref 1.2–2.2)
Albumin: 4 g/dL (ref 3.8–4.8)
Alkaline Phosphatase: 68 IU/L (ref 39–117)
BUN/Creatinine Ratio: 18 (ref 9–23)
BUN: 12 mg/dL (ref 6–20)
Bilirubin Total: <0.2 mg/dL (ref 0.0–1.2)
CO2: 20 mmol/L (ref 20–29)
Calcium: 9.1 mg/dL (ref 8.7–10.2)
Chloride: 103 mmol/L (ref 96–106)
Creatinine, Ser: 0.68 mg/dL (ref 0.57–1.00)
GFR calc Af Amer: 131 mL/min/{1.73_m2} (ref >59)
GFR calc non Af Amer: 114 mL/min/{1.73_m2} (ref >59)
Globulin, Total: 2.9 g/dL (ref 1.5–4.5)
Glucose: 99 mg/dL (ref 65–99)
Potassium: 3.8 mmol/L (ref 3.5–5.2)
Sodium: 137 mmol/L (ref 134–144)
Total Protein: 6.9 g/dL (ref 6.0–8.5)

## 2018-10-05 LAB — LIPID PANEL
Chol/HDL Ratio: 2.5 ratio (ref 0.0–4.4)
Cholesterol, Total: 249 mg/dL — ABNORMAL HIGH (ref 100–199)
HDL: 98 mg/dL (ref >39)
LDL Calculated: 142 mg/dL — ABNORMAL HIGH (ref 0–99)
Triglycerides: 46 mg/dL (ref 0–149)
VLDL Cholesterol Cal: 9 mg/dL (ref 5–40)

## 2018-10-05 LAB — CARBAMAZEPINE, FREE AND TOTAL
Carbamazepine, Free: 4 ug/mL (ref 0.6–4.2)
Carbamazepine, Total: 15.6 ug/mL (ref 4.0–12.0)

## 2018-10-05 LAB — HEMOGLOBIN A1C
Est. average glucose Bld gHb Est-mCnc: 117 mg/dL
Hgb A1c MFr Bld: 5.7 % — ABNORMAL HIGH (ref 4.8–5.6)

## 2018-10-05 LAB — TSH: TSH: 2.9 u[IU]/mL (ref 0.450–4.500)

## 2018-10-07 ENCOUNTER — Telehealth: Payer: Self-pay | Admitting: Physician Assistant

## 2018-10-07 ENCOUNTER — Other Ambulatory Visit: Payer: Self-pay | Admitting: Physician Assistant

## 2018-10-07 DIAGNOSIS — N946 Dysmenorrhea, unspecified: Secondary | ICD-10-CM

## 2018-10-07 DIAGNOSIS — J0140 Acute pansinusitis, unspecified: Secondary | ICD-10-CM

## 2018-10-07 MED ORDER — AMOXICILLIN 875 MG PO TABS: 875.0000 mg | ORAL_TABLET | Freq: Two times a day (BID) | ORAL | 0 refills | Status: DC

## 2018-10-07 NOTE — Telephone Encounter (Signed)
°  Pt's mother called wanting something sent to the pharmacy for congestion.  Still having some constipation too.  CB# [phone]  teri

## 2018-10-07 NOTE — Telephone Encounter (Signed)
Please advise 

## 2018-10-07 NOTE — Telephone Encounter (Signed)
Sent in amoxicillin for congestion. Make sure to take linzess daily before breakfast. Can take stool softener and/or Miralax with Linzess as well. Also do not store [place] in the bathroom. The medication can become ineffective from humidity.

## 2018-10-08 NOTE — Telephone Encounter (Signed)
Patient advised as directed below. 

## 2018-10-11 ENCOUNTER — Telehealth (INDEPENDENT_AMBULATORY_CARE_PROVIDER_SITE_OTHER): Payer: Self-pay | Admitting: Family

## 2018-10-11 DIAGNOSIS — G40209 Localization-related (focal) (partial) symptomatic epilepsy and epileptic syndromes with complex partial seizures, not intractable, without status epilepticus: Secondary | ICD-10-CM

## 2018-10-11 NOTE — Telephone Encounter (Signed)
Spoke with pharmacist (the number in the chart) and everything went through

## 2018-10-11 NOTE — Telephone Encounter (Signed)
She needed PA not a refill. The PA has been approved by [place] (her Medicare Part D insurance).  Please let Mom know. Thanks, Otila Kluver

## 2018-10-11 NOTE — Telephone Encounter (Signed)
°  Who's calling (name and relationship to patient) : [name] (mom)  Best contact number: [phone]  Provider they see: Lolita Lenz  Reason for call: LVM for medication refill, patient will be out tomorrow.  Need PA for brand name medication.     PRESCRIPTION REFILL ONLY  Name of prescription: Carbatrol 200mg  and Carbatrol 300mg    Pharmacy:

## 2018-10-11 NOTE — Telephone Encounter (Signed)
Please send to the pharmacy °

## 2018-11-10 ENCOUNTER — Other Ambulatory Visit: Payer: Self-pay | Admitting: Physician Assistant

## 2018-11-10 DIAGNOSIS — H10401 Unspecified chronic conjunctivitis, right eye: Secondary | ICD-10-CM

## 2018-11-29 ENCOUNTER — Other Ambulatory Visit: Payer: Self-pay | Admitting: Physician Assistant

## 2018-11-29 DIAGNOSIS — K5904 Chronic idiopathic constipation: Secondary | ICD-10-CM

## 2018-12-07 ENCOUNTER — Other Ambulatory Visit (INDEPENDENT_AMBULATORY_CARE_PROVIDER_SITE_OTHER): Payer: Self-pay | Admitting: Pediatrics

## 2018-12-07 DIAGNOSIS — G40209 Localization-related (focal) (partial) symptomatic epilepsy and epileptic syndromes with complex partial seizures, not intractable, without status epilepticus: Secondary | ICD-10-CM

## 2018-12-07 NOTE — Telephone Encounter (Signed)
Please send to the pharmacy °

## 2018-12-08 ENCOUNTER — Other Ambulatory Visit: Payer: Self-pay | Admitting: Physician Assistant

## 2018-12-08 DIAGNOSIS — E876 Hypokalemia: Secondary | ICD-10-CM

## 2018-12-10 ENCOUNTER — Telehealth: Payer: Self-pay | Admitting: Physician Assistant

## 2018-12-10 DIAGNOSIS — L2082 Flexural eczema: Secondary | ICD-10-CM

## 2018-12-10 DIAGNOSIS — R059 Cough, unspecified: Secondary | ICD-10-CM

## 2018-12-10 DIAGNOSIS — J0140 Acute pansinusitis, unspecified: Secondary | ICD-10-CM

## 2018-12-10 DIAGNOSIS — R05 Cough: Secondary | ICD-10-CM

## 2018-12-10 MED ORDER — TRIAMCINOLONE ACETONIDE 0.05 % EX OINT: TOPICAL_OINTMENT | CUTANEOUS | 5 refills | Status: DC

## 2018-12-10 MED ORDER — AMOXICILLIN 875 MG PO TABS: 875.0000 mg | ORAL_TABLET | Freq: Two times a day (BID) | ORAL | 0 refills | Status: DC

## 2018-12-10 MED ORDER — PSEUDOEPH-BROMPHEN-DM 30-2-10 MG/5ML PO SYRP: 5.0000 mL | ORAL_SOLUTION | Freq: Four times a day (QID) | ORAL | 0 refills | Status: DC | PRN

## 2018-12-10 NOTE — Telephone Encounter (Signed)
Mom called for pt needing something for her sore throat and neck hurting. Cough syrup  antibiotic  Needing a refill on: TRIAMCINOLONE ACETONIDE, TOP, 0.05 % OINT  Please call into: [place] [place] [facility] [place] [phone] (Phone) [phone] (Fax)   Thanks, [employer]

## 2018-12-10 NOTE — Telephone Encounter (Signed)
meds all sent to [facility]

## 2018-12-10 NOTE — Telephone Encounter (Signed)
Please advise 

## 2018-12-15 ENCOUNTER — Ambulatory Visit (INDEPENDENT_AMBULATORY_CARE_PROVIDER_SITE_OTHER): Payer: Medicare Other | Admitting: Family

## 2018-12-16 ENCOUNTER — Telehealth: Payer: Self-pay | Admitting: Physician Assistant

## 2018-12-16 NOTE — Progress Notes (Signed)
Patient: Joanna Robinson Female    DOB: [birth year]   38 y.o.   MRN: [id number] Visit Date: 12/17/2018  Today's Provider: Margaretann Loveless, PA-C   Chief Complaint  Patient presents with  . Pre-op Exam   Subjective:     HPI  Patient is here for pre-op exam. Patient has an upcoming dental procedure that requires a pre-op visit with pcp. She has 3 cavities per mother that are needing to be filled.  She currently has no complaints per mother. She has been doing well, just gaining weight from not having her adult daycare to go to and be active due to covid.   Allergies  Allergen Reactions  . Codeine   . Corticosteroids Nausea Only     Current Outpatient Medications:  .  acetaZOLAMIDE (DIAMOX) 250 MG tablet, TAKE 1 TABLET BY MOUTH TWICE DAILY, Disp: 60 tablet, Rfl: 5 .  Alpha-D-Galactosidase (BEANO) TABS, Take by mouth as needed (2-3 Tabs as needed prior to meal that may cause bloating.)., Disp: , Rfl:  .  Aluminum & Magnesium Hydroxide (MYLANTA ULTIMATE STRENGTH) 500-500 MG/5ML SUSP, MYLANTA ULTIMATE STRENGTH, 500-500MG /5ML (Oral Suspension)  1 TID as needed for 0 days  Quantity: 0.00;  Refills: 0   Ordered :14-Mar-2010  Denna Haggard, MA, Anastasiya ;  Started 19-October-2008 Active Comments: Medication taken as needed. , Disp: , Rfl:  .  Azelastine-Fluticasone 137-50 MCG/ACT SUSP, USE 1 SPRAY INTO EACH NOSTRIL TWICE DAILY, Disp: 23 g, Rfl: 3 .  benzoyl peroxide-erythromycin (BENZAMYCIN) gel, Apply 2 (two) times daily topically., Disp: 46.6 g, Rfl: 5 .  CARBATROL 200 MG 12 hr capsule, TAKE 2 CAPSULES BY MOUTH ONCE EVERY MORNING AND 1 CAPSULE ONCE EVERY EVENING, Disp: 90 capsule, Rfl: 5 .  CARBATROL 300 MG 12 hr capsule, TAKE 1 CAPSULE BY MOUTH AT BEDTIME, Disp: 30 capsule, Rfl: 5 .  cloNIDine (CATAPRES) 0.2 MG tablet, TAKE 1 TABLET BY MOUTH AT BEDTIME, Disp: 90 tablet, Rfl: 1 .  Dextromethorphan-Menthol (DELSYM COUGH RELIEF MT), Use as directed in the mouth or throat as needed  (2 Tsp. by mouth every 12 hours as needed for cough.). Reported on 04/07/2015, Disp: , Rfl:  .  fexofenadine (ALLEGRA ALLERGY) 180 MG tablet, Take 1 tablet (180 mg total) by mouth daily., Disp: 90 tablet, Rfl: 3 .  fluvoxaMINE (LUVOX) 100 MG tablet, TAKE 1 TABLET BY MOUTH TWICE DAILY, Disp: 180 tablet, Rfl: 1 .  furosemide (LASIX) 20 MG tablet, Take 1 tablet by mouth daily., Disp: , Rfl:  .  GAVILAX 17 GM/SCOOP powder, MIX 17 GRAMS IN 4 TO 8 OZ OF FLUID AND DRINK AT BEDTIME, Disp: 527 g, Rfl: 3 .  hydrochlorothiazide (HYDRODIURIL) 25 MG tablet, TAKE 1 TABLET BY MOUTH ONCE DAILY, Disp: 90 tablet, Rfl: 1 .  hydrocortisone (ANUSOL-HC) 2.5 % rectal cream, Place 1 application rectally 2 (two) times daily., Disp: , Rfl:  .  LINZESS 290 MCG CAPS capsule, TAKE 1 CAPSULE BY MOUTH ONCE DAILY BEFORE BREAKFAST, Disp: 90 capsule, Rfl: 1 .  MAPAP 500 MG tablet, TAKE 2 TABLETS BY MOUTH 3 TIMES DAILY ASNEEDED HEADACHE, Disp: 60 tablet, Rfl: 5 .  montelukast (SINGULAIR) 10 MG tablet, TAKE 1 TABLET BY MOUTH ONCE DAILY, Disp: 90 tablet, Rfl: 3 .  naproxen (NAPROSYN) 500 MG tablet, TAKE 1 TABLET BY MOUTH TWICE DAILY, Disp: 180 tablet, Rfl: 1 .  omega-3 acid ethyl esters (LOVAZA) 1 g capsule, Take 1 capsule (1 g total) by mouth daily., Disp: 90 capsule,  Rfl: 1 .  omeprazole (PRILOSEC) 40 MG capsule, Take 1 capsule (40 mg total) by mouth daily., Disp: 30 capsule, Rfl: 3 .  potassium chloride (KLOR-CON) 20 MEQ packet, DISSOLVE 1 PACKET IN 4 OUNCES OF LIQUID AND DRINK ONCE DAILY., Disp: 90 each, Rfl: 1 .  sodium fluoride (DENTA 5000 PLUS) 1.1 % CREA dental cream, Place 1 application onto teeth every evening. Brush teeth daily for 1 minute, rinse and spit., Disp: 1 Tube, Rfl: 5 .  sulfacetamide (BLEPH-10) 10 % ophthalmic solution, PLACE 2 DROPS INTO RIGHT EYE 4 TIMES DAILY, Disp: 15 mL, Rfl: 5 .  Sunscreens (AVEENO CLEAR COMPLEXION BB) CREA, Apply as directed bid, Disp: 75 mL, Rfl: 11 .  TRIAMCINOLONE ACETONIDE, TOP, 0.05 %  OINT, Apply to rash as needed TID., Disp: 85 g, Rfl: 5 .  Witch Hazel (TUCKS) 50 % PADS, Apply 50 % topically as needed (As needed for cleansing and hemorrhoid discomfort.)., Disp: , Rfl:   Review of Systems  Constitutional: Negative for appetite change, chills, fatigue and fever.  Respiratory: Negative for chest tightness and shortness of breath.   Cardiovascular: Negative for chest pain and palpitations.  Gastrointestinal: Negative for abdominal pain, nausea and vomiting.  Neurological: Negative for dizziness and weakness.    Social History   Tobacco Use  . Smoking status: Never Smoker  . Smokeless tobacco: Never Used  Substance Use Topics  . Alcohol use: No      Objective:   BP 97/73 (BP Location: Right Arm, Patient Position: Sitting, Cuff Size: Large)   Pulse 77   Temp (!) 97.2 F (36.2 C) (Other (Comment)) Comment (Src): forehead  Resp 16   Ht 5\' 8"  (1.727 m)   Wt 194 lb 3.2 oz (88.1 kg)   SpO2 97%   BMI 29.53 kg/m  Vitals:   12/17/18 1118  BP: 97/73  Pulse: 77  Resp: 16  Temp: (!) 97.2 F (36.2 C)  TempSrc: Other (Comment)  SpO2: 97%  Weight: 194 lb 3.2 oz (88.1 kg)  Height: 5\' 8"  (1.727 m)  Body mass index is 29.53 kg/m.   Physical Exam Vitals signs reviewed.  Constitutional:      General: She is not in acute distress.    Appearance: Normal appearance. She is well-developed. She is not ill-appearing or diaphoretic.  HENT:     Head: Normocephalic and atraumatic.     Right Ear: Tympanic membrane, ear canal and external ear normal.     Left Ear: Tympanic membrane, ear canal and external ear normal.     Nose: Nose normal.     Mouth/Throat:     Mouth: Mucous membranes are moist.     Pharynx: No oropharyngeal exudate.  Eyes:     General: No scleral icterus.       Right eye: No discharge.        Left eye: No discharge.     Extraocular Movements: Extraocular movements intact.     Conjunctiva/sclera: Conjunctivae normal.     Pupils: Pupils are equal,  round, and reactive to light.  Neck:     Musculoskeletal: Normal range of motion and neck supple.     Thyroid: No thyromegaly.     Vascular: No JVD.     Trachea: No tracheal deviation.  Cardiovascular:     Rate and Rhythm: Normal rate and regular rhythm.     Pulses: Normal pulses.     Heart sounds: Normal heart sounds. No murmur. No friction rub. No gallop.   Pulmonary:  Effort: Pulmonary effort is normal. No respiratory distress.     Breath sounds: Normal breath sounds. No wheezing or rales.  Chest:     Chest wall: No tenderness.  Abdominal:     General: Abdomen is flat. Bowel sounds are normal. There is no distension.     Palpations: Abdomen is soft. There is no mass.     Tenderness: There is no abdominal tenderness. There is no guarding or rebound.  Musculoskeletal: Normal range of motion.        General: No tenderness.     Right lower leg: No edema.     Left lower leg: No edema.  Lymphadenopathy:     Cervical: No cervical adenopathy.  Skin:    General: Skin is warm and dry.     Capillary Refill: Capillary refill takes less than 2 seconds.     Findings: No rash.  Neurological:     General: No focal deficit present.     Mental Status: She is alert. Mental status is at baseline.  Psychiatric:        Mood and Affect: Mood normal.        Behavior: Behavior normal.        Thought Content: Thought content normal.        Judgment: Judgment normal.      No results found for any visits on 12/17/18.     Assessment & Plan    1. Pre-op examination Normal exam today. Patient at baseline. EKG today shows NSR rate of 72 with no ST segment changes personally reviewed by me. Will check labs as below and f/u pending results. Using the SORT calculator she is considered very low risk for post operative complications with a 0.07% of complications, including mortality, to occur within 30 days post operatively.  - EKG 12-Lead - CBC w/Diff/Platelet - Basic Metabolic Panel (BMET) -  Carbamazepine, Free and Total - HgB A1c  2. Epilepsy, not refractory (HCC) Stable. Followed by Neurology. Will check labs as below and f/u pending results. - CBC w/Diff/Platelet - Basic Metabolic Panel (BMET) - Carbamazepine, Free and Total - HgB A1c  3. Neutropenia, unspecified type ([place]) H/O this. Normal Hematology work up. Will check labs as below and f/u pending results. - CBC w/Diff/Platelet - Basic Metabolic Panel (BMET) - Carbamazepine, Free and Total - HgB A1c  4. Long-term use of high-risk medication Tegretol. Will check labs as below and f/u pending results. - CBC w/Diff/Platelet - Basic Metabolic Panel (BMET) - Carbamazepine, Free and Total - HgB A1c  5. Elevated carbamazepine level H/O elevated levels. Will check labs as below and f/u pending results. - CBC w/Diff/Platelet - Basic Metabolic Panel (BMET) - Carbamazepine, Free and Total - HgB A1c  6. Active autistic disorder Stable.  - CBC w/Diff/Platelet - Basic Metabolic Panel (BMET) - Carbamazepine, Free and Total - HgB A1c     Mar Daring, PA-C  [facility]

## 2018-12-17 ENCOUNTER — Encounter: Payer: Self-pay | Admitting: Physician Assistant

## 2018-12-17 ENCOUNTER — Other Ambulatory Visit: Payer: Self-pay

## 2018-12-17 ENCOUNTER — Ambulatory Visit (INDEPENDENT_AMBULATORY_CARE_PROVIDER_SITE_OTHER): Payer: Medicare Other | Admitting: Physician Assistant

## 2018-12-17 VITALS — BP 97/73 | HR 77 | Temp 97.2°F | Resp 16 | Ht 68.0 in | Wt 194.2 lb

## 2018-12-17 DIAGNOSIS — R7889 Finding of other specified substances, not normally found in blood: Secondary | ICD-10-CM | POA: Diagnosis not present

## 2018-12-17 DIAGNOSIS — G40909 Epilepsy, unspecified, not intractable, without status epilepticus: Secondary | ICD-10-CM | POA: Diagnosis not present

## 2018-12-17 DIAGNOSIS — Z79899 Other long term (current) drug therapy: Secondary | ICD-10-CM

## 2018-12-17 DIAGNOSIS — F840 Autistic disorder: Secondary | ICD-10-CM

## 2018-12-17 DIAGNOSIS — D709 Neutropenia, unspecified: Secondary | ICD-10-CM | POA: Diagnosis not present

## 2018-12-17 DIAGNOSIS — Z01818 Encounter for other preprocedural examination: Secondary | ICD-10-CM | POA: Diagnosis not present

## 2018-12-17 NOTE — Patient Instructions (Signed)
Diet and Dental Disease What you eat affects the health of your teeth. In addition to good oral hygiene, choosing the right foods can help to keep your teeth healthy and free from dental problems such as tooth decay and gum disease. It is important to know which foods can help to keep your teeth strong and which foods can cause problems for your teeth. If you do not get the proper nutrients in your diet, your body may be less able to prevent dental disease. What general guidelines do I need to follow? Food guidelines      Eat a healthy, well-balanced diet that includes a variety of foods. Your diet should include: ? Fiber-rich fruits and vegetables. ? Lean sources of protein. These include eggs, lean meat, chicken, fish, and low-fat or fat-free dairy products. ? Whole grains. These include brown rice, whole-wheat bread, and pasta. ? Fresh, whole, and unprocessed foods. These foods help you produce more saliva, which is important for good dental health.  Try to wait at least 2 hours between meals, snacks, and drinks.  Avoid foods that contain high amounts of sugar or easily digested (refined) simple carbohydrates, such as candies, cakes, cookies, and syrups. Eating these foods often over a long period of time can increase your risk of developing cavities (dental caries).  Avoid eating sticky or acidic foods by themselves, such as caramel, dried fruit, jams, or citrus fruits. If you eat these types of foods, eat them with meals rather than between meals.  Rinse your mouth with water after eating sugary snacks.  Brush and floss your teeth after meals and snacks if possible.  Chew sugar-free gum right after a meal or snack if you cannot brush your teeth. Beverage guidelines  Avoid sipping drinks that are sweetened with sugar, such as soda. Sipping these drinks for prolonged periods can increase your risk of cavities.  Avoid holding or swishing acidic or sugary drinks in your mouth.  Keep  yourself hydrated by drinking adequate amounts of water. Drink enough fluid to keep your urine clear or pale yellow. Recommended foods  Foods that are high in vitamins C and A, such as fresh fruits and vegetables. These vitamins are important for keeping gums healthy and for building tooth enamel.  High-quality protein foods. These include lean meats, eggs, cheese, milk, yogurt, fish, beans, and legumes.  Whole-grain breads and cereals that are low in sugar.  Sugar-free chewing gum and sugar-free mints.  Foods that are good sources of calcium. These include cheese, milk, plain yogurt, calcium-fortified tofu, leafy greens, and almonds.  Water, especially fluoridated water. The items listed above may not be a complete list of recommended foods and beverages. Contact a dietitian for more options. Foods to avoid  Any food or drink that contains sugar or is sweetened with sugar. These should be avoided in general or only consumed in moderation. This list includes fruit drinks, carbonated beverages, sport or energy drinks, as well as sweetened coffees and teas.  Sticky foods. These include raisins and other dried fruits, and candies that are sticky, hard, or slow to melt. It is best to avoid these.  Foods that are high in refined carbohydrates or higher in sugar. These include cookies, cakes, muffins, chips, and crackers.  Acidic foods and drinks. These include tomatoes, citrus fruits, coffee, and fruit juice. These foods and drinks can weaken tooth enamel, which can cause tooth sensitivity, erosion, and pitting. The items listed above may not be a complete list of foods and beverages  to avoid. Contact a dietitian for more information. Summary  Choosing the right foods can help to keep your teeth healthy and free from dental problems such as tooth decay and gum disease.  Recommended foods include fresh fruits and vegetables, high-quality protein, whole-grain breads and cereals, and foods that  are high in calcium.  Avoid foods or drinks that are high in refined carbohydrates or sugar. Avoid acidic foods and drinks. This information is not intended to replace advice given to you by your health care provider. Make sure you discuss any questions you have with your health care provider. Document Released: 11/06/2010 Document Revised: 03/10/2017 Document Reviewed: 03/10/2017 Elsevier Patient Education  2020 [employer].

## 2018-12-20 DIAGNOSIS — D709 Neutropenia, unspecified: Secondary | ICD-10-CM | POA: Diagnosis not present

## 2018-12-20 DIAGNOSIS — Z79899 Other long term (current) drug therapy: Secondary | ICD-10-CM | POA: Diagnosis not present

## 2018-12-20 DIAGNOSIS — G40909 Epilepsy, unspecified, not intractable, without status epilepticus: Secondary | ICD-10-CM | POA: Diagnosis not present

## 2018-12-20 DIAGNOSIS — R7889 Finding of other specified substances, not normally found in blood: Secondary | ICD-10-CM | POA: Diagnosis not present

## 2018-12-20 DIAGNOSIS — F840 Autistic disorder: Secondary | ICD-10-CM | POA: Diagnosis not present

## 2018-12-20 DIAGNOSIS — Z01818 Encounter for other preprocedural examination: Secondary | ICD-10-CM | POA: Diagnosis not present

## 2018-12-22 ENCOUNTER — Telehealth: Payer: Self-pay

## 2018-12-22 ENCOUNTER — Ambulatory Visit (INDEPENDENT_AMBULATORY_CARE_PROVIDER_SITE_OTHER): Payer: Medicare Other | Admitting: Family

## 2018-12-22 ENCOUNTER — Encounter (INDEPENDENT_AMBULATORY_CARE_PROVIDER_SITE_OTHER): Payer: Self-pay | Admitting: Family

## 2018-12-22 DIAGNOSIS — G40909 Epilepsy, unspecified, not intractable, without status epilepticus: Secondary | ICD-10-CM | POA: Diagnosis not present

## 2018-12-22 DIAGNOSIS — F79 Unspecified intellectual disabilities: Secondary | ICD-10-CM | POA: Diagnosis not present

## 2018-12-22 DIAGNOSIS — F840 Autistic disorder: Secondary | ICD-10-CM | POA: Diagnosis not present

## 2018-12-22 DIAGNOSIS — D72819 Decreased white blood cell count, unspecified: Secondary | ICD-10-CM

## 2018-12-22 LAB — CBC WITH DIFFERENTIAL/PLATELET
Basophils Absolute: 0 10*3/uL (ref 0.0–0.2)
Basos: 1 %
EOS (ABSOLUTE): 0.1 10*3/uL (ref 0.0–0.4)
Eos: 2 %
Hematocrit: 34.4 % (ref 34.0–46.6)
Hemoglobin: 11 g/dL — ABNORMAL LOW (ref 11.1–15.9)
Immature Grans (Abs): 0 10*3/uL (ref 0.0–0.1)
Immature Granulocytes: 0 %
Lymphocytes Absolute: 0.7 10*3/uL (ref 0.7–3.1)
Lymphs: 26 %
MCH: 25.9 pg — ABNORMAL LOW (ref 26.6–33.0)
MCHC: 32 g/dL (ref 31.5–35.7)
MCV: 81 fL (ref 79–97)
Monocytes Absolute: 0.5 10*3/uL (ref 0.1–0.9)
Monocytes: 18 %
Neutrophils Absolute: 1.3 10*3/uL — ABNORMAL LOW (ref 1.4–7.0)
Neutrophils: 53 %
Platelets: 214 10*3/uL (ref 150–450)
RBC: 4.24 x10E6/uL (ref 3.77–5.28)
RDW: 14.2 % (ref 11.7–15.4)
WBC: 2.5 10*3/uL — CL (ref 3.4–10.8)

## 2018-12-22 LAB — BASIC METABOLIC PANEL
BUN/Creatinine Ratio: 15 (ref 9–23)
BUN: 12 mg/dL (ref 6–20)
CO2: 19 mmol/L — ABNORMAL LOW (ref 20–29)
Calcium: 9.3 mg/dL (ref 8.7–10.2)
Chloride: 106 mmol/L (ref 96–106)
Creatinine, Ser: 0.8 mg/dL (ref 0.57–1.00)
GFR calc Af Amer: 110 mL/min/{1.73_m2} (ref >59)
GFR calc non Af Amer: 95 mL/min/{1.73_m2} (ref >59)
Glucose: 99 mg/dL (ref 65–99)
Potassium: 3.6 mmol/L (ref 3.5–5.2)
Sodium: 136 mmol/L (ref 134–144)

## 2018-12-22 LAB — CARBAMAZEPINE, FREE AND TOTAL
Carbamazepine, Free: 3.6 ug/mL (ref 0.6–4.2)
Carbamazepine, Total: 15.2 ug/mL (ref 4.0–12.0)

## 2018-12-22 LAB — HEMOGLOBIN A1C
Est. average glucose Bld gHb Est-mCnc: 114 mg/dL
Hgb A1c MFr Bld: 5.6 % (ref 4.8–5.6)

## 2018-12-22 NOTE — Telephone Encounter (Signed)
-----   Message from Mar Daring, PA-C sent at 12/22/2018  8:53 AM EDT ----- Labs are essentially stable for her. Her WBC count is slightly lower than normal. Would recommend antibiotics after her dental surgery. Tegretol levels 15.2, essentially stable for her.

## 2018-12-22 NOTE — Telephone Encounter (Signed)
Joanna Robinson advised.  She wanted to know if you will prescribed the antibiotic or if the surgeon should prescribe it?  Also she is concern that the last time her WBC was low Joanna Robinson had to go to hematology.    Thanks,    -[name]

## 2018-12-22 NOTE — Progress Notes (Signed)
This is a Pediatric Specialist E-Visit follow up consult provided via Telephone Joanna Robinson and their parent/guardian Joanna Robinson consented to an E-Visit consult today.  Location of patient: Joanna Robinson is at home Location of provider:Britni Driscoll, NP is in office Patient was referred by Suella GroveBurnette, Jennifer M, P*   The following participants were involved in this E-Visit: mom, patient, CMA, provider  Chief Complain/ Reason for E-Visit today: Epilepsy Total time on call: 10 min Follow up: 6 months     Joanna Robinson   MRN:  [id number]  1980/08/20   Provider: Elveria Risingina Clare Casto NP-C [facility]  Visit type: Telehealth visit  Last visit: 04/01/2018  Referral source: Lorie PhenixNancy Maloney, MD History from: mother, patient, and chcn chart  Brief history:  History of undifferentiated autism and complex partial seizures   Today's concerns:  Mom reports that Joanna Robinson has been seizure free and doing well. She tends to have small, brief seizures around her menstrual cycle when they occur. Joanna Robinson has been at home in her mother's care due to Covid 19 pandemic. Mom says that Joanna Robinson has gained some weight from being less active and that she is going to work on that. She had a dental appointment recently that revealed a cavity and will that repaired under anesthesia next week. Joanna Robinson has lab studies last week that revealed Carbamazepine of 15.582mcg/ml, which is typical for her. She has no evidence of toxicity with this level. She also had a WBC of 2.5 and Mom says that her PCP plans to recheck that in 2 weeks.   Joanna Robinson has been otherwise generally healthy since she was last seen. Mom has no other health concerns for Joanna Robinson today other than previously mentioned.    Review of systems: Please see HPI for neurologic and other pertinent review of systems. Otherwise all other systems were reviewed and were negative.  Problem List: Patient Active  Problem List   Diagnosis Date Noted  . Abnormal blood chemistry 12/20/2014  . Abscess of axilla 12/20/2014  . Dry eye syndrome 12/20/2014  . Dermoid inclusion cyst 12/20/2014  . Hematoma 12/20/2014  . HLD (hyperlipidemia) 12/20/2014  . Decreased potassium in the blood 12/20/2014  . Irregular bleeding 12/20/2014  . Malaise and fatigue 12/20/2014  . Headache 12/18/2014  . Acne 10/30/2014  . Constipation 09/18/2014  . Neutropenia (HCC) 09/18/2014  . Elevated carbamazepine level 01/25/2014  . Active autistic disorder 01/10/2013  . Long-term use of high-risk medication 01/10/2013  . Dysmenorrhea 07/10/2008  . Mechanical and motor problems with internal organs 11/08/2007  . Allergic rhinitis 08/20/2007  . External hemorrhoids without complication 11/13/2006  . Edema 12/26/2003  . Epilepsy, not refractory (HCC) 12/26/2003  . Intellectual disability 12/26/2003     Past Medical History:  Diagnosis Date  . Autism   . Epilepsy (HCC)   . Hemorrhoid   . Hyperlipidemia   . Seizures (HCC)     Past medical history comments: See HPI   Surgical history: Past Surgical History:  Procedure Laterality Date  . NO PAST SURGERIES       Family history: family history includes Alzheimer's disease in her maternal grandmother and paternal grandfather.   Social history: Social History   Socioeconomic History  . Marital status: Single    Spouse name: Not on file  . Number of children: Not on file  . Years of education: Not on file  . Highest education level: Not on file  Occupational History  . Not on file  Social Needs  . Financial resource strain: Not on file  . Food insecurity    Worry: Not on file    Inability: Not on file  . Transportation needs    Medical: Not on file    Non-medical: Not on file  Tobacco Use  . Smoking status: Never Smoker  . Smokeless tobacco: Never Used  Substance and Sexual Activity  . Alcohol use: No  . Drug use: No  . Sexual activity: Never   Lifestyle  . Physical activity    Days per week: Not on file    Minutes per session: Not on file  . Stress: Not on file  Relationships  . Social Herbalist on phone: Not on file    Gets together: Not on file    Attends religious service: Not on file    Active member of club or organization: Not on file    Attends meetings of clubs or organizations: Not on file    Relationship status: Not on file  . Intimate partner violence    Fear of current or ex partner: Not on file    Emotionally abused: Not on file    Physically abused: Not on file    Forced sexual activity: Not on file  Other Topics Concern  . 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 She has a nurse that comes to her home and they go out on field trips. She enjoys exercise, dance, and riding bike. She lives with her mother.      Past/failed meds:   Allergies: Allergies  Allergen Reactions  . Codeine   . Corticosteroids Nausea Only      Immunizations: Immunization History  Administered Date(s) Administered  . Influenza,inj,Quad PF,6+ Mos 01/07/2013, 01/05/2018  . Td 12/20/2004  . Tdap 08/16/2010      Diagnostics/Screenings:    Physical Exam: There were no vitals taken for this visit. There was no physical examination as it was a telephone visit.  Impression: 1. Autism spectrum disorder 2. Complex partial seizures with secondary generalization  Recommendations for plan of care: The patient's previous [facility] records were reviewed. Joanna Robinson has neither had nor required imaging studies since the last visit. She has had lab studies and Mom is aware of those results. Joanna Robinson is a 38 year old woman with history of undifferentiated autism and complex partial seizures with secondary generalization. She is taking and tolerating brand Carbatrol for her seizure disorder. She had a recent level of 15.67mcg/ml which is typical for Joanna Robinson. The WBC count was lower than  expected at 2.5 and will be rechecked by her PCP in 2 weeks. Mom says that Joanna Robinson has gained some weight during the pandemic quarantine and she plans to work on reducing calories and trying to get her to be more active. I will see Joanna Robinson back in follow up in 6 months or sooner if needed. Mom agreed with the plans made today.   The medication list was reviewed and reconciled. No changes were made in the prescribed medications today. A complete medication list was provided to the patient.  Allergies as of 12/22/2018      Reactions   Codeine    Corticosteroids Nausea Only      Medication List       Accurate as of December 22, 2018 10:31 AM. If you have any questions, ask your nurse or doctor.        acetaZOLAMIDE 250 MG tablet Commonly  known as: DIAMOX TAKE 1 TABLET BY MOUTH TWICE DAILY   Aveeno Clear Complexion BB Crea Apply as directed bid   Azelastine-Fluticasone 137-50 MCG/ACT Susp USE 1 SPRAY INTO EACH NOSTRIL TWICE DAILY   Beano Tabs Take by mouth as needed (2-3 Tabs as needed prior to meal that may cause bloating.).   benzoyl peroxide-erythromycin gel Commonly known as: BENZAMYCIN Apply 2 (two) times daily topically.   Carbatrol 200 MG 12 hr capsule Generic drug: carbamazepine TAKE 2 CAPSULES BY MOUTH ONCE EVERY MORNING AND 1 CAPSULE ONCE EVERY EVENING   Carbatrol 300 MG 12 hr capsule Generic drug: carbamazepine TAKE 1 CAPSULE BY MOUTH AT BEDTIME   cloNIDine 0.2 MG tablet Commonly known as: CATAPRES TAKE 1 TABLET BY MOUTH AT BEDTIME   DELSYM COUGH RELIEF MT Use as directed in the mouth or throat as needed (2 Tsp. by mouth every 12 hours as needed for cough.). Reported on 04/07/2015   fexofenadine 180 MG tablet Commonly known as: Allegra Allergy Take 1 tablet (180 mg total) by mouth daily.   fluvoxaMINE 100 MG tablet Commonly known as: LUVOX TAKE 1 TABLET BY MOUTH TWICE DAILY   GaviLAX 17 GM/SCOOP powder Generic drug: polyethylene glycol powder MIX 17  GRAMS IN 4 TO 8 OZ OF FLUID AND DRINK AT BEDTIME   hydrochlorothiazide 25 MG tablet Commonly known as: HYDRODIURIL TAKE 1 TABLET BY MOUTH ONCE DAILY   hydrocortisone 2.5 % rectal cream Commonly known as: ANUSOL-HC Place 1 application rectally 2 (two) times daily.   Lasix 20 MG tablet Generic drug: furosemide Take 1 tablet by mouth daily.   Linzess 290 MCG Caps capsule Generic drug: linaclotide TAKE 1 CAPSULE BY MOUTH ONCE DAILY BEFORE BREAKFAST   Mapap 500 MG tablet Generic drug: acetaminophen TAKE 2 TABLETS BY MOUTH 3 TIMES DAILY ASNEEDED HEADACHE   montelukast 10 MG tablet Commonly known as: SINGULAIR TAKE 1 TABLET BY MOUTH ONCE DAILY   Mylanta Ultimate Strength 500-500 MG/5ML Susp Generic drug: Aluminum & Magnesium Hydroxide MYLANTA ULTIMATE STRENGTH, 500-500MG /5ML (Oral Suspension)  1 TID as needed for 0 days  Quantity: 0.00;  Refills: 0   Ordered :14-Mar-2010  Denna Haggard, MA, Anastasiya ;  Started 19-October-2008 Active Comments: Medication taken as needed.   naproxen 500 MG tablet Commonly known as: NAPROSYN TAKE 1 TABLET BY MOUTH TWICE DAILY   omega-3 acid ethyl esters 1 g capsule Commonly known as: LOVAZA Take 1 capsule (1 g total) by mouth daily.   omeprazole 40 MG capsule Commonly known as: PRILOSEC Take 1 capsule (40 mg total) by mouth daily.   potassium chloride 20 MEQ packet Commonly known as: KLOR-CON DISSOLVE 1 PACKET IN 4 OUNCES OF LIQUID AND DRINK ONCE DAILY.   sodium fluoride 1.1 % Crea dental cream Commonly known as: Denta 5000 Plus Place 1 application onto teeth every evening. Brush teeth daily for 1 minute, rinse and spit.   sulfacetamide 10 % ophthalmic solution Commonly known as: BLEPH-10 PLACE 2 DROPS INTO RIGHT EYE 4 TIMES DAILY   TRIAMCINOLONE ACETONIDE (TOP) 0.05 % Oint Apply to rash as needed TID.   Tucks 50 % Pads Apply 50 % topically as needed (As needed for cleansing and hemorrhoid discomfort.).       Total time spent  on the phone with the patient's mother was 10 minutes, of which 50% or more was spent in counseling and coordination of care.  Elveria Rising NP-C [facility] Ph. [phone] Fax [phone]

## 2018-12-22 NOTE — Telephone Encounter (Signed)
Ms. Joanna Robinson advised as directed below. She is requesting for the labs to be fax to the dentist. She asked about the WBC number and we got disconnected.

## 2018-12-22 NOTE — Telephone Encounter (Signed)
LMTCB

## 2018-12-22 NOTE — Telephone Encounter (Signed)
The dentist should, but if they dont she can call me and I will send in.   I dont think she needs referral yet. Will recheck in 4 weeks.

## 2018-12-22 NOTE — Patient Instructions (Signed)
Thank you for meeting with me by phone today.   Instructions for you until your next appointment are as follows: 1. Continue Joanna Robinson's medications as you have been giving them.  2. Let me know if she has seizures 3. Work on reducing calorie intake and finding ways for Joanna Robinson to be more active.  4. Please sign up for MyChart if you have not done so 5. Please plan to return for follow up in 6 months or sooner if needed.

## 2018-12-22 NOTE — Telephone Encounter (Signed)
Patient mother advised as directed below.

## 2018-12-22 NOTE — Telephone Encounter (Signed)
Pt's mom called back saying she was disconnected form her call  CB#  [phone]  Joanna Robinson

## 2019-01-05 ENCOUNTER — Other Ambulatory Visit: Payer: Self-pay | Admitting: Physician Assistant

## 2019-01-05 DIAGNOSIS — K219 Gastro-esophageal reflux disease without esophagitis: Secondary | ICD-10-CM

## 2019-01-05 DIAGNOSIS — R609 Edema, unspecified: Secondary | ICD-10-CM

## 2019-01-05 DIAGNOSIS — G479 Sleep disorder, unspecified: Secondary | ICD-10-CM

## 2019-01-06 ENCOUNTER — Other Ambulatory Visit: Payer: Self-pay | Admitting: Physician Assistant

## 2019-01-06 DIAGNOSIS — K029 Dental caries, unspecified: Secondary | ICD-10-CM | POA: Diagnosis not present

## 2019-01-06 DIAGNOSIS — R569 Unspecified convulsions: Secondary | ICD-10-CM | POA: Diagnosis not present

## 2019-01-06 DIAGNOSIS — F840 Autistic disorder: Secondary | ICD-10-CM | POA: Diagnosis not present

## 2019-01-06 DIAGNOSIS — K054 Periodontosis: Secondary | ICD-10-CM | POA: Diagnosis not present

## 2019-01-06 DIAGNOSIS — F419 Anxiety disorder, unspecified: Secondary | ICD-10-CM | POA: Diagnosis not present

## 2019-01-07 NOTE — Telephone Encounter (Signed)
Spoke with Patients mother and she was made aware that the script has been sent to the pharmacy.

## 2019-02-04 NOTE — Progress Notes (Signed)
Patient: Joanna Robinson Female    DOB: Mar 16, 1981   38 y.o.   MRN: [id number] Visit Date: 02/07/2019  Today's Provider: Mar Daring, PA-C   Chief Complaint  Patient presents with  . Follow-up   Subjective:     HPI  Patient is here to recheck her labs. Patient overall well.  CBC Latest Ref Rng & Units 12/20/2018 10/01/2018 10/28/2017  WBC 3.4 - 10.8 x10E3/uL 2.5(LL) 3.1(L) 3.4  Hemoglobin 11.1 - 15.9 g/dL 11.0(L) 11.1 11.7  Hematocrit 34.0 - 46.6 % 34.4 35.5 37.8  Platelets 150 - 450 x10E3/uL 214 190 185    Allergies  Allergen Reactions  . Codeine   . Corticosteroids Nausea Only     Current Outpatient Medications:  .  acetaZOLAMIDE (DIAMOX) 250 MG tablet, TAKE 1 TABLET BY MOUTH TWICE DAILY, Disp: 60 tablet, Rfl: 5 .  Alpha-D-Galactosidase (BEANO) TABS, Take by mouth as needed (2-3 Tabs as needed prior to meal that may cause bloating.)., Disp: , Rfl:  .  Aluminum & Magnesium Hydroxide (MYLANTA ULTIMATE STRENGTH) 500-500 MG/5ML SUSP, MYLANTA ULTIMATE STRENGTH, 500-500MG /5ML (Oral Suspension)  1 TID as needed for 0 days  Quantity: 0.00;  Refills: 0   Ordered :14-Mar-2010  Celene Kras, MA, Anastasiya ;  Started 19-October-2008 Active Comments: Medication taken as needed. , Disp: , Rfl:  .  Azelastine-Fluticasone 137-50 MCG/ACT SUSP, USE 1 SPRAY INTO EACH NOSTRIL TWICE DAILY, Disp: 23 g, Rfl: 3 .  benzoyl peroxide-erythromycin (BENZAMYCIN) gel, Apply 2 (two) times daily topically., Disp: 46.6 g, Rfl: 5 .  CARBATROL 200 MG 12 hr capsule, TAKE 2 CAPSULES BY MOUTH ONCE EVERY MORNING AND 1 CAPSULE ONCE EVERY EVENING, Disp: 90 capsule, Rfl: 5 .  CARBATROL 300 MG 12 hr capsule, TAKE 1 CAPSULE BY MOUTH AT BEDTIME, Disp: 30 capsule, Rfl: 5 .  cloNIDine (CATAPRES) 0.2 MG tablet, TAKE 1 TABLET BY MOUTH AT BEDTIME, Disp: 90 tablet, Rfl: 1 .  Dextromethorphan-Menthol (DELSYM COUGH RELIEF MT), Use as directed in the mouth or throat as needed (2 Tsp. by mouth every 12 hours as needed  for cough.). Reported on 04/07/2015, Disp: , Rfl:  .  fexofenadine (ALLEGRA ALLERGY) 180 MG tablet, Take 1 tablet (180 mg total) by mouth daily., Disp: 90 tablet, Rfl: 3 .  fluvoxaMINE (LUVOX) 100 MG tablet, TAKE 1 TABLET BY MOUTH TWICE DAILY, Disp: 180 tablet, Rfl: 1 .  furosemide (LASIX) 20 MG tablet, Take 1 tablet by mouth daily., Disp: , Rfl:  .  GAVILAX 17 GM/SCOOP powder, MIX 17 GRAMS IN 4 TO 8 OZ OF FLUID AND DRINK AT BEDTIME, Disp: 527 g, Rfl: 3 .  hydrochlorothiazide (HYDRODIURIL) 25 MG tablet, TAKE 1 TABLET BY MOUTH ONCE DAILY, Disp: 90 tablet, Rfl: 1 .  hydrocortisone (ANUSOL-HC) 2.5 % rectal cream, Place 1 application rectally 2 (two) times daily., Disp: , Rfl:  .  LINZESS 290 MCG CAPS capsule, TAKE 1 CAPSULE BY MOUTH ONCE DAILY BEFORE BREAKFAST, Disp: 90 capsule, Rfl: 1 .  MAPAP 500 MG tablet, TAKE 2 TABLETS BY MOUTH 3 TIMES DAILY ASNEEDED HEADACHE, Disp: 60 tablet, Rfl: 5 .  montelukast (SINGULAIR) 10 MG tablet, TAKE 1 TABLET BY MOUTH ONCE DAILY, Disp: 90 tablet, Rfl: 3 .  naproxen (NAPROSYN) 500 MG tablet, TAKE 1 TABLET BY MOUTH TWICE DAILY, Disp: 180 tablet, Rfl: 1 .  omega-3 acid ethyl esters (LOVAZA) 1 g capsule, Take 1 capsule (1 g total) by mouth daily., Disp: 90 capsule, Rfl: 1 .  omeprazole (PRILOSEC)  40 MG capsule, TAKE 1 CAPSULE BY MOUTH ONCE DAILY, Disp: 90 capsule, Rfl: 1 .  potassium chloride (KLOR-CON) 20 MEQ packet, DISSOLVE 1 PACKET IN 4 OUNCES OF LIQUID AND DRINK ONCE DAILY., Disp: 90 each, Rfl: 1 .  sodium fluoride (DENTA 5000 PLUS) 1.1 % CREA dental cream, Place 1 application onto teeth every evening. Brush teeth daily for 1 minute, rinse and spit., Disp: 1 Tube, Rfl: 5 .  sulfacetamide (BLEPH-10) 10 % ophthalmic solution, PLACE 2 DROPS INTO RIGHT EYE 4 TIMES DAILY, Disp: 15 mL, Rfl: 5 .  Sunscreens (AVEENO CLEAR COMPLEXION BB) CREA, Apply as directed bid, Disp: 75 mL, Rfl: 11 .  TRIAMCINOLONE ACETONIDE, TOP, 0.05 % OINT, Apply to rash as needed TID., Disp: 85 g, Rfl:  5 .  Witch Hazel (TUCKS) 50 % PADS, Apply 50 % topically as needed (As needed for cleansing and hemorrhoid discomfort.)., Disp: , Rfl:   Review of Systems  Constitutional: Negative.   Respiratory: Negative.   Cardiovascular: Negative.   Neurological: Negative.     Social History   Tobacco Use  . Smoking status: Never Smoker  . Smokeless tobacco: Never Used  Substance Use Topics  . Alcohol use: No      Objective:   BP 100/70 (BP Location: Right Arm, Patient Position: Sitting, Cuff Size: Large)   Pulse 60   Temp (!) 97.3 F (36.3 C) (Temporal)   Resp 16  Vitals:   02/07/19 0901  BP: 100/70  Pulse: 60  Resp: 16  Temp: (!) 97.3 F (36.3 C)  TempSrc: Temporal  There is no height or weight on file to calculate BMI.   Physical Exam Vitals signs reviewed.  Constitutional:      General: She is not in acute distress.    Appearance: Normal appearance. She is well-developed. She is not ill-appearing or diaphoretic.  HENT:     Head: Normocephalic and atraumatic.  Neck:     Musculoskeletal: Normal range of motion and neck supple.  Cardiovascular:     Rate and Rhythm: Normal rate and regular rhythm.     Pulses: Normal pulses.     Heart sounds: Normal heart sounds. No murmur. No friction rub. No gallop.   Pulmonary:     Effort: Pulmonary effort is normal. No respiratory distress.     Breath sounds: Normal breath sounds. No wheezing or rales.      No results found for any visits on 02/07/19.     Assessment & Plan    1. Other decreased white blood cell (WBC) count Was decreased at last check. Will check labs as below and f/u pending results. - CBC w/Diff/Platelet  2. Flexural eczema Stable. Diagnosis pulled for medication refill. Continue current medical treatment plan. - TRIAMCINOLONE ACETONIDE, TOP, 0.05 % OINT; Apply to rash as needed TID.  Dispense: 85 g; Refill: 5  3. Poor dental hygiene Stable. Diagnosis pulled for medication refill. Continue current medical  treatment plan. - sodium fluoride (DENTA 5000 PLUS) 1.1 % CREA dental cream; Place 1 application onto teeth every evening. Brush teeth daily for 1 minute, rinse and spit.  Dispense: 1 Tube; Refill: 5  4. Acne vulgaris Stable. Diagnosis pulled for medication refill. Continue current medical treatment plan. - benzoyl peroxide-erythromycin (BENZAMYCIN) gel; Apply topically 2 (two) times daily.  Dispense: 46.6 g; Refill: 5  5. Elevated carbamazepine level Monitoring.  - Carbamazepine Level (Tegretol), total  6. Need for influenza vaccination Flu vaccine given today without complication. Patient sat upright for 15 minutes  to check for adverse reaction before being released.     Margaretann LovelessJennifer M Burnette, PA-C  [facility]

## 2019-02-07 ENCOUNTER — Ambulatory Visit (INDEPENDENT_AMBULATORY_CARE_PROVIDER_SITE_OTHER): Payer: Medicare Other | Admitting: Physician Assistant

## 2019-02-07 ENCOUNTER — Other Ambulatory Visit: Payer: Self-pay

## 2019-02-07 ENCOUNTER — Other Ambulatory Visit: Payer: Self-pay | Admitting: Physician Assistant

## 2019-02-07 ENCOUNTER — Telehealth: Payer: Self-pay | Admitting: Physician Assistant

## 2019-02-07 ENCOUNTER — Encounter: Payer: Self-pay | Admitting: Physician Assistant

## 2019-02-07 VITALS — BP 100/70 | HR 60 | Temp 97.3°F | Resp 16

## 2019-02-07 DIAGNOSIS — L700 Acne vulgaris: Secondary | ICD-10-CM | POA: Diagnosis not present

## 2019-02-07 DIAGNOSIS — Z23 Encounter for immunization: Secondary | ICD-10-CM | POA: Diagnosis not present

## 2019-02-07 DIAGNOSIS — Z9189 Other specified personal risk factors, not elsewhere classified: Secondary | ICD-10-CM | POA: Diagnosis not present

## 2019-02-07 DIAGNOSIS — J302 Other seasonal allergic rhinitis: Secondary | ICD-10-CM

## 2019-02-07 DIAGNOSIS — D72818 Other decreased white blood cell count: Secondary | ICD-10-CM | POA: Diagnosis not present

## 2019-02-07 DIAGNOSIS — R7889 Finding of other specified substances, not normally found in blood: Secondary | ICD-10-CM

## 2019-02-07 DIAGNOSIS — L2082 Flexural eczema: Secondary | ICD-10-CM

## 2019-02-07 MED ORDER — TRIAMCINOLONE ACETONIDE 0.1 % EX CREA: 1.0000 "application " | TOPICAL_CREAM | Freq: Two times a day (BID) | CUTANEOUS | 0 refills | Status: DC

## 2019-02-07 MED ORDER — TRIAMCINOLONE ACETONIDE 0.05 % EX OINT: TOPICAL_OINTMENT | CUTANEOUS | 5 refills | Status: DC

## 2019-02-07 MED ORDER — BENZOYL PEROXIDE-ERYTHROMYCIN 5-3 % EX GEL: Freq: Two times a day (BID) | CUTANEOUS | 5 refills | Status: DC

## 2019-02-07 MED ORDER — SODIUM FLUORIDE 1.1 % DT CREA: 1.0000 "application " | TOPICAL_CREAM | Freq: Every evening | DENTAL | 5 refills | Status: AC

## 2019-02-07 NOTE — Telephone Encounter (Signed)
Joanna Robinson w/ [facility] calling regarding the refill for:  TRIAMCINOLONE ACETONIDE, TOP, 0.05 % OINT  The pharmacy doesn't carry the strength at the quantity. Asking if it could be changed to another strength?  Please advise.  Thanks, [employer]

## 2019-02-07 NOTE — Addendum Note (Signed)
Addended by: Doristine Devoid on: 02/07/2019 11:04 AM   Modules accepted: Orders

## 2019-02-07 NOTE — Telephone Encounter (Signed)
Changed dose

## 2019-02-08 LAB — CBC WITH DIFFERENTIAL/PLATELET
Basophils Absolute: 0 10*3/uL (ref 0.0–0.2)
Basos: 1 %
EOS (ABSOLUTE): 0.1 10*3/uL (ref 0.0–0.4)
Eos: 2 %
Hematocrit: 35 % (ref 34.0–46.6)
Hemoglobin: 10.6 g/dL — ABNORMAL LOW (ref 11.1–15.9)
Immature Grans (Abs): 0 10*3/uL (ref 0.0–0.1)
Immature Granulocytes: 0 %
Lymphocytes Absolute: 0.9 10*3/uL (ref 0.7–3.1)
Lymphs: 25 %
MCH: 24.8 pg — ABNORMAL LOW (ref 26.6–33.0)
MCHC: 30.3 g/dL — ABNORMAL LOW (ref 31.5–35.7)
MCV: 82 fL (ref 79–97)
Monocytes Absolute: 0.5 10*3/uL (ref 0.1–0.9)
Monocytes: 15 %
Neutrophils Absolute: 2 10*3/uL (ref 1.4–7.0)
Neutrophils: 57 %
Platelets: 200 10*3/uL (ref 150–450)
RBC: 4.27 x10E6/uL (ref 3.77–5.28)
RDW: 14.5 % (ref 11.7–15.4)
WBC: 3.4 10*3/uL (ref 3.4–10.8)

## 2019-02-08 LAB — CARBAMAZEPINE LEVEL, TOTAL: Carbamazepine (Tegretol), S: 15.1 ug/mL (ref 4.0–12.0)

## 2019-02-09 ENCOUNTER — Telehealth: Payer: Self-pay

## 2019-02-09 NOTE — Telephone Encounter (Signed)
-----   Message from Mar Daring, PA-C sent at 02/08/2019  1:20 PM EST ----- WBC count back to her baseline. Rest of blood count stable. Tegretol level is stable for her at 15.1.

## 2019-02-09 NOTE — Telephone Encounter (Signed)
Patient's mother/guardian advised as directed below.

## 2019-03-05 ENCOUNTER — Other Ambulatory Visit: Payer: Self-pay | Admitting: Physician Assistant

## 2019-03-05 ENCOUNTER — Other Ambulatory Visit (INDEPENDENT_AMBULATORY_CARE_PROVIDER_SITE_OTHER): Payer: Self-pay | Admitting: Family

## 2019-03-05 DIAGNOSIS — G40209 Localization-related (focal) (partial) symptomatic epilepsy and epileptic syndromes with complex partial seizures, not intractable, without status epilepticus: Secondary | ICD-10-CM

## 2019-03-05 DIAGNOSIS — K5900 Constipation, unspecified: Secondary | ICD-10-CM

## 2019-03-05 DIAGNOSIS — J302 Other seasonal allergic rhinitis: Secondary | ICD-10-CM

## 2019-03-05 DIAGNOSIS — N946 Dysmenorrhea, unspecified: Secondary | ICD-10-CM

## 2019-04-04 ENCOUNTER — Telehealth (INDEPENDENT_AMBULATORY_CARE_PROVIDER_SITE_OTHER): Payer: Self-pay | Admitting: Family

## 2019-04-04 NOTE — Telephone Encounter (Signed)
Who's calling (name and relationship to patient) : Mother/  Joanna Robinson  Best contact number: [phone]  Provider they see: Dr. [name]   Reason for call: mom was hoping to get a letter to work from home to care for the patient.   Call ID:      PRESCRIPTION REFILL ONLY  Name of prescription:  Pharmacy:

## 2019-04-04 NOTE — Telephone Encounter (Signed)
I called and talked to Mom. She said that since school is reopening that she needs a letter to continue to work from home or that she will need FMLA to care for Jacquelinne because of the ongoing Covid 19 pandemic. She wants the letter emailed to her as well as Hayden Pedro, Trixie Rude and [employer]. I told Mom that I would write the letter as requested TG

## 2019-04-06 ENCOUNTER — Other Ambulatory Visit: Payer: Self-pay | Admitting: Physician Assistant

## 2019-04-06 DIAGNOSIS — K5904 Chronic idiopathic constipation: Secondary | ICD-10-CM

## 2019-04-06 DIAGNOSIS — G479 Sleep disorder, unspecified: Secondary | ICD-10-CM

## 2019-04-06 DIAGNOSIS — R609 Edema, unspecified: Secondary | ICD-10-CM

## 2019-04-08 ENCOUNTER — Other Ambulatory Visit: Payer: Self-pay | Admitting: Physician Assistant

## 2019-05-06 ENCOUNTER — Other Ambulatory Visit: Payer: Self-pay | Admitting: Physician Assistant

## 2019-05-06 ENCOUNTER — Other Ambulatory Visit (INDEPENDENT_AMBULATORY_CARE_PROVIDER_SITE_OTHER): Payer: Self-pay | Admitting: Pediatrics

## 2019-05-06 DIAGNOSIS — G40209 Localization-related (focal) (partial) symptomatic epilepsy and epileptic syndromes with complex partial seizures, not intractable, without status epilepticus: Secondary | ICD-10-CM

## 2019-05-06 DIAGNOSIS — H10401 Unspecified chronic conjunctivitis, right eye: Secondary | ICD-10-CM

## 2019-05-06 DIAGNOSIS — K5904 Chronic idiopathic constipation: Secondary | ICD-10-CM

## 2019-05-06 DIAGNOSIS — K5900 Constipation, unspecified: Secondary | ICD-10-CM

## 2019-05-06 DIAGNOSIS — J302 Other seasonal allergic rhinitis: Secondary | ICD-10-CM

## 2019-05-06 DIAGNOSIS — G479 Sleep disorder, unspecified: Secondary | ICD-10-CM

## 2019-05-06 DIAGNOSIS — R609 Edema, unspecified: Secondary | ICD-10-CM

## 2019-05-06 NOTE — Telephone Encounter (Signed)
Please send to the pharmacy °

## 2019-05-06 NOTE — Telephone Encounter (Signed)
Requested Prescriptions  Pending Prescriptions Disp Refills  . hydrochlorothiazide (HYDRODIURIL) 25 MG tablet [Pharmacy Med Name: HYDROCHLOROTHIAZIDE 25 MG TAB] 90 tablet 1    Sig: TAKE 1 TABLET BY MOUTH ONCE DAILY     Cardiovascular: Diuretics - Thiazide Passed - 05/06/2019  8:19 AM      Passed - Ca in normal range and within 360 days    Calcium  Date Value Ref Range Status  12/20/2018 9.3 8.7 - 10.2 mg/dL Final         Passed - Cr in normal range and within 360 days    Creatinine, Ser  Date Value Ref Range Status  12/20/2018 0.80 0.57 - 1.00 mg/dL Final         Passed - K in normal range and within 360 days    Potassium  Date Value Ref Range Status  12/20/2018 3.6 3.5 - 5.2 mmol/L Final         Passed - Na in normal range and within 360 days    Sodium  Date Value Ref Range Status  12/20/2018 136 134 - 144 mmol/L Final         Passed - Last BP in normal range    BP Readings from Last 1 Encounters:  02/07/19 100/70         Passed - Valid encounter within last 6 months    Recent Outpatient Visits          2 months ago Need for influenza vaccination   [facility] [place], Alessandra Bevels, [place]   4 months ago Pre-op examination   [facility] [place], [place]andra [place], [place]   7 months ago Annual physical exam   [facility] [name], [place]   7 months ago Gastroesophageal reflux disease without esophagitis   [facility] [place], [place]andra [place], [place]   1 year ago Sore neck   [facility] [place], [place], [place]      Future Appointments            In 1 week Burnette, Alessandra [place], PA-C [employer], [facility]           . cloNIDine (CATAPRES) 0.2 MG tablet [Pharmacy Med Name: CLONIDINE HCL 0.2 MG TAB] 90 tablet 1    Sig: TAKE 1 TABLET BY MOUTH AT BEDTIME     Cardiovascular:  Alpha-2 Agonists Passed - 05/06/2019  8:19 AM      Passed - Last BP in normal range    BP Readings from Last  1 Encounters:  02/07/19 100/70         Passed - Last Heart Rate in normal range    Pulse Readings from Last 1 Encounters:  02/07/19 60         Passed - Valid encounter within last 6 months    Recent Outpatient Visits          2 months ago Need for influenza vaccination   [facility] [place], Alessandra Be[place], [place]   4 months ago Pre-op examination   [facility] [place], Alessandra Bevel[place], [place]   7 months ago Annual physical exam   [facility] [name] [place], [place]   7 months ago Gastroesophageal reflux disease without esophagitis   [facility], Alessandra [place]vel[place], [place]   1 year ago Sore neck   [facility] [place], Alessandra Bevel[place], [place]      Future Appointments  In 1 week Burnette, Clearnce [place], PA-C [employer], [facility]

## 2019-05-06 NOTE — Telephone Encounter (Signed)
Requested Prescriptions  Pending Prescriptions Disp Refills  . montelukast (SINGULAIR) 10 MG tablet [Pharmacy Med Name: MONTELUKAST SODIUM 10 MG TAB] 90 tablet 1    Sig: TAKE 1 TABLET BY MOUTH ONCE DAILY     Pulmonology:  Leukotriene Inhibitors Passed - 05/06/2019  8:25 AM      Passed - Valid encounter within last 12 months    Recent Outpatient Visits          2 months ago Need for influenza vaccination   [facility] [place], Alessandra Bevels, [place]   4 months ago Pre-op examination   [facility] [place], Alessandra Bevel[place], [place]   7 months ago Annual physical exam   [facility] [name], [place]   7 months ago Gastroesophageal reflux disease without esophagitis   [facility], [place]andra [place], [place]   1 year ago Sore neck   [facility] [place], Alessandra Bevels, [place]      Future Appointments            In 1 week Rosezetta Schlatter, Alessandra [place]vel[place], PA-C [employer], [facility]

## 2019-05-06 NOTE — Telephone Encounter (Signed)
Requested medication (s) are due for refill today: yes  Requested medication (s) are on the active medication list: yes  Last refill:  04/06/2019  Future visit scheduled: yes  Notes to clinic: medication not assigned to a protocol   Requested Prescriptions  Pending Prescriptions Disp Refills   sulfacetamide (BLEPH-10) 10 % ophthalmic solution [Pharmacy Med Name: SULFACETAMIDE SODIUM 10% EYE SOLN M] 15 mL 5    Sig: PLACE 2 DROPS INTO RIGHT EYE 4 TIMES DAILY      Off-Protocol Failed - 05/06/2019  8:23 AM      Failed - Medication not assigned to a protocol, review manually.      Passed - Valid encounter within last 12 months    Recent Outpatient Visits           2 months ago Need for influenza vaccination   [facility], Alessandra Bevel[place], [place]   4 months ago Pre-op examination   [facility] [name], [place]   7 months ago Annual physical exam   [facility] [name], [place]   7 months ago Gastroesophageal reflux disease without esophagitis   [facility], Alessandra [place]vel[place], [place]   1 year ago Sore neck   [facility] [place], Alessandra Bevels, [place]       Future Appointments             In 1 week Rosezetta Schlatter, Alessandra Bevels, PA-C [employer], [facility]             Signed Prescriptions Disp Refills   LINZESS 290 MCG CAPS capsule 90 capsule 0    Sig: TAKE 1 CAPSULE BY MOUTH ONCE DAILY BEFORE BREAFAST      Gastroenterology: Irritable Bowel Syndrome Passed - 05/06/2019  8:23 AM      Passed - Valid encounter within last 12 months    Recent Outpatient Visits           2 months ago Need for influenza vaccination   [facility] [place], Alessandra Bevel[place], [place]   4 months ago Pre-op examination   [facility] [place], Alessandra Bevel[place], [place]   7 months ago Annual physical exam   [facility] [name], [place]   7 months ago Gastroesophageal  reflux disease without esophagitis   [facility], [place]andra [place], [place]   1 year ago Sore neck   [facility] [place], Alessandra Be[place], [place]       Future Appointments             In 1 week Burnette, Alessandra [place]vel[place], PA-C [facility], [facility]              polyethylene glycol powder (GLYCOLAX/MIRALAX) 17 GM/SCOOP powder 510 g 0    Sig: MIX 17 GRAMS IN 4 TO 8 OZ OF FLUID AND DRINK AT BEDTIME      Gastroenterology:  Laxatives Passed - 05/06/2019  8:23 AM      Passed - Valid encounter within last 12 months    Recent Outpatient Visits           2 months ago Need for influenza vaccination   [facility] [place], Alessandra Bevels, [place]   4 months ago Pre-op examination   [facility] [place], Alessandra Be[place], [place]   7 months ago Annual physical exam   [facility] [name], [place]   7 months ago Gastroesophageal reflux disease without esophagitis   [facility]  Mar Daring, PA-C   1 year ago Sore neck   [facility] [place], Clearnce Sorrel, [place]       Future Appointments             In 1 week Marlyn Corporal, Clearnce Sorrel, PA-C [employer], [facility]

## 2019-05-13 ENCOUNTER — Ambulatory Visit: Payer: Self-pay | Admitting: Physician Assistant

## 2019-05-19 ENCOUNTER — Ambulatory Visit: Payer: Self-pay | Admitting: Physician Assistant

## 2019-05-19 ENCOUNTER — Telehealth: Payer: Self-pay

## 2019-05-19 NOTE — Progress Notes (Deleted)
Patient: Joanna Robinson Female    DOB: February 07, 1981   39 y.o.   MRN: [id number] Visit Date: 05/19/2019  Today's Provider: Mar Daring, PA-C   No chief complaint on file.  Subjective:     HPI   Follow up for ***  The patient was last seen for this {NUMBERS 1-12:18279} {days/wks/mos/yrs:310907} ago. Changes made at last visit include ***.  She reports {excellent/good/fair/poor:19665} compliance with treatment. She feels that condition is {improved/worse/unchanged:3041574}. She {ACTION; IS/IS MCN:[id number]} having side effects. ***  ------------------------------------------------------------------------------------    Allergies  Allergen Reactions  . Codeine   . Corticosteroids Nausea Only     Current Outpatient Medications:  .  acetaZOLAMIDE (DIAMOX) 250 MG tablet, TAKE 1 TABLET BY MOUTH TWICE DAILY, Disp: 60 tablet, Rfl: 5 .  Alpha-D-Galactosidase (BEANO) TABS, Take by mouth as needed (2-3 Tabs as needed prior to meal that may cause bloating.)., Disp: , Rfl:  .  Aluminum & Magnesium Hydroxide (MYLANTA ULTIMATE STRENGTH) 500-500 MG/5ML SUSP, MYLANTA ULTIMATE STRENGTH, 500-500MG /5ML (Oral Suspension)  1 TID as needed for 0 days  Quantity: 0.00;  Refills: 0   Ordered :14-Mar-2010  Celene Kras, MA, Anastasiya ;  Started 19-October-2008 Active Comments: Medication taken as needed. , Disp: , Rfl:  .  Azelastine-Fluticasone 137-50 MCG/ACT SUSP, USE 1 SPRAY INTO EACH NOSTRIL TWICE DAILY, Disp: 23 g, Rfl: 3 .  benzoyl peroxide-erythromycin (BENZAMYCIN) gel, Apply topically 2 (two) times daily., Disp: 46.6 g, Rfl: 5 .  CARBATROL 200 MG 12 hr capsule, TAKE 2 CAPSULES BY MOUTH ONCE EVERY MORNING AND 1 CAPSULE ONCE EVERY EVENING, Disp: 90 capsule, Rfl: 5 .  CARBATROL 300 MG 12 hr capsule, TAKE 1 CAPSULE BY MOUTH AT BEDTIME, Disp: 30 capsule, Rfl: 5 .  cloNIDine (CATAPRES) 0.2 MG tablet, TAKE 1 TABLET BY MOUTH AT BEDTIME, Disp: 90 tablet, Rfl: 1 .  Dextromethorphan-Menthol  (DELSYM COUGH RELIEF MT), Use as directed in the mouth or throat as needed (2 Tsp. by mouth every 12 hours as needed for cough.). Reported on 04/07/2015, Disp: , Rfl:  .  fexofenadine (ALLEGRA) 180 MG tablet, TAKE 1 TABLET BY MOUTH ONCE DAILY, Disp: 90 tablet, Rfl: 1 .  fluvoxaMINE (LUVOX) 100 MG tablet, TAKE 1 TABLET BY MOUTH TWICE DAILY, Disp: 180 tablet, Rfl: 1 .  furosemide (LASIX) 20 MG tablet, Take 1 tablet by mouth daily., Disp: , Rfl:  .  hydrochlorothiazide (HYDRODIURIL) 25 MG tablet, TAKE 1 TABLET BY MOUTH ONCE DAILY, Disp: 90 tablet, Rfl: 1 .  hydrocortisone (ANUSOL-HC) 2.5 % rectal cream, Place 1 application rectally 2 (two) times daily., Disp: , Rfl:  .  LINZESS 290 MCG CAPS capsule, TAKE 1 CAPSULE BY MOUTH ONCE DAILY BEFORE BREAFAST, Disp: 90 capsule, Rfl: 0 .  MAPAP 500 MG tablet, TAKE 2 TABLETS BY MOUTH 3 TIMES DAILY ASNEEDED HEADACHE, Disp: 60 tablet, Rfl: 5 .  montelukast (SINGULAIR) 10 MG tablet, TAKE 1 TABLET BY MOUTH ONCE DAILY, Disp: 90 tablet, Rfl: 1 .  naproxen (NAPROSYN) 500 MG tablet, TAKE 1 TABLET BY MOUTH TWICE DAILY, Disp: 180 tablet, Rfl: 1 .  omega-3 acid ethyl esters (LOVAZA) 1 g capsule, Take 1 capsule (1 g total) by mouth daily., Disp: 90 capsule, Rfl: 1 .  omeprazole (PRILOSEC) 40 MG capsule, TAKE 1 CAPSULE BY MOUTH ONCE DAILY, Disp: 90 capsule, Rfl: 1 .  polyethylene glycol powder (GLYCOLAX/MIRALAX) 17 GM/SCOOP powder, MIX 17 GRAMS IN 4 TO 8 OZ OF FLUID AND DRINK AT BEDTIME, Disp: 510 g, Rfl:  0 .  potassium chloride (KLOR-CON) 20 MEQ packet, DISSOLVE 1 PACKET IN 4 OUNCES OF LIQUID AND DRINK ONCE DAILY., Disp: 90 each, Rfl: 1 .  sodium fluoride (DENTA 5000 PLUS) 1.1 % CREA dental cream, Place 1 application onto teeth every evening. Brush teeth daily for 1 minute, rinse and spit., Disp: 1 Tube, Rfl: 5 .  sulfacetamide (BLEPH-10) 10 % ophthalmic solution, PLACE 2 DROPS INTO RIGHT EYE 4 TIMES DAILY, Disp: 15 mL, Rfl: 5 .  Sunscreens (AVEENO CLEAR COMPLEXION BB) CREA,  Apply as directed bid, Disp: 75 mL, Rfl: 11 .  triamcinolone cream (KENALOG) 0.1 %, Apply 1 application topically 2 (two) times daily., Disp: 30 g, Rfl: 0 .  Witch Hazel (TUCKS) 50 % PADS, Apply 50 % topically as needed (As needed for cleansing and hemorrhoid discomfort.)., Disp: , Rfl:   Review of Systems  Social History   Tobacco Use  . Smoking status: Never Smoker  . Smokeless tobacco: Never Used  Substance Use Topics  . Alcohol use: No      Objective:   There were no vitals taken for this visit. There were no vitals filed for this visit.There is no height or weight on file to calculate BMI.   Physical Exam   No results found for any visits on 05/19/19.     Assessment & Plan        Margaretann Loveless, PA-C  [facility]

## 2019-05-19 NOTE — Telephone Encounter (Signed)
Copied from CRM [id number]. Topic: Quick Communication - Appointment Cancellation >> May 19, 2019  7:58 AM Leafy Ro wrote: Patient called to cancel appointment scheduled for jenni . Patient HAS  rescheduled their appointment. Pt mom is having a hard time this morning  Route to department's PEC pool.

## 2019-05-26 ENCOUNTER — Encounter: Payer: Self-pay | Admitting: Physician Assistant

## 2019-05-26 ENCOUNTER — Other Ambulatory Visit: Payer: Self-pay

## 2019-05-26 ENCOUNTER — Other Ambulatory Visit: Payer: Self-pay | Admitting: Physician Assistant

## 2019-05-26 ENCOUNTER — Ambulatory Visit (INDEPENDENT_AMBULATORY_CARE_PROVIDER_SITE_OTHER): Payer: Medicare Other | Admitting: Physician Assistant

## 2019-05-26 VITALS — BP 108/73 | HR 85 | Temp 97.2°F | Wt 198.0 lb

## 2019-05-26 DIAGNOSIS — R059 Cough, unspecified: Secondary | ICD-10-CM

## 2019-05-26 DIAGNOSIS — G40909 Epilepsy, unspecified, not intractable, without status epilepticus: Secondary | ICD-10-CM | POA: Diagnosis not present

## 2019-05-26 DIAGNOSIS — D508 Other iron deficiency anemias: Secondary | ICD-10-CM

## 2019-05-26 DIAGNOSIS — R05 Cough: Secondary | ICD-10-CM | POA: Diagnosis not present

## 2019-05-26 DIAGNOSIS — R7889 Finding of other specified substances, not normally found in blood: Secondary | ICD-10-CM

## 2019-05-26 DIAGNOSIS — D709 Neutropenia, unspecified: Secondary | ICD-10-CM

## 2019-05-26 DIAGNOSIS — E876 Hypokalemia: Secondary | ICD-10-CM

## 2019-05-26 DIAGNOSIS — Z79899 Other long term (current) drug therapy: Secondary | ICD-10-CM | POA: Diagnosis not present

## 2019-05-26 MED ORDER — PSEUDOEPH-BROMPHEN-DM 30-2-10 MG/5ML PO SYRP: 5.0000 mL | ORAL_SOLUTION | Freq: Four times a day (QID) | ORAL | 0 refills | Status: DC | PRN

## 2019-05-26 NOTE — Patient Instructions (Signed)

## 2019-05-26 NOTE — Progress Notes (Signed)
Patient: Joanna Robinson Female    DOB: [birth year]   39 y.o.   MRN: [id number] Visit Date: 05/26/2019  Today's Provider: Margaretann Loveless, PA-C   Chief Complaint  Patient presents with  . Anemia   Subjective:     HPI    Follow up for Anemia  The patient was last seen for this 3 months ago. Changes made at last visit include No changes.  She reports excellent compliance with treatment. She feels that condition is Unchanged. She is not having side effects.   ------------------------------------------------------------------------------------    Allergies  Allergen Reactions  . Codeine   . Corticosteroids Nausea Only     Current Outpatient Medications:  .  acetaZOLAMIDE (DIAMOX) 250 MG tablet, TAKE 1 TABLET BY MOUTH TWICE DAILY, Disp: 60 tablet, Rfl: 5 .  Alpha-D-Galactosidase (BEANO) TABS, Take by mouth as needed (2-3 Tabs as needed prior to meal that may cause bloating.)., Disp: , Rfl:  .  Aluminum & Magnesium Hydroxide (MYLANTA ULTIMATE STRENGTH) 500-500 MG/5ML SUSP, MYLANTA ULTIMATE STRENGTH, 500-500MG /5ML (Oral Suspension)  1 TID as needed for 0 days  Quantity: 0.00;  Refills: 0   Ordered :14-Mar-2010  Denna Haggard, MA, Anastasiya ;  Started 19-October-2008 Active Comments: Medication taken as needed. , Disp: , Rfl:  .  Azelastine-Fluticasone 137-50 MCG/ACT SUSP, USE 1 SPRAY INTO EACH NOSTRIL TWICE DAILY, Disp: 23 g, Rfl: 3 .  benzoyl peroxide-erythromycin (BENZAMYCIN) gel, Apply topically 2 (two) times daily., Disp: 46.6 g, Rfl: 5 .  CARBATROL 200 MG 12 hr capsule, TAKE 2 CAPSULES BY MOUTH ONCE EVERY MORNING AND 1 CAPSULE ONCE EVERY EVENING, Disp: 90 capsule, Rfl: 5 .  CARBATROL 300 MG 12 hr capsule, TAKE 1 CAPSULE BY MOUTH AT BEDTIME, Disp: 30 capsule, Rfl: 5 .  cloNIDine (CATAPRES) 0.2 MG tablet, TAKE 1 TABLET BY MOUTH AT BEDTIME, Disp: 90 tablet, Rfl: 1 .  Dextromethorphan-Menthol (DELSYM COUGH RELIEF MT), Use as directed in the mouth or throat as needed (2  Tsp. by mouth every 12 hours as needed for cough.). Reported on 04/07/2015, Disp: , Rfl:  .  fexofenadine (ALLEGRA) 180 MG tablet, TAKE 1 TABLET BY MOUTH ONCE DAILY, Disp: 90 tablet, Rfl: 1 .  fluvoxaMINE (LUVOX) 100 MG tablet, TAKE 1 TABLET BY MOUTH TWICE DAILY, Disp: 180 tablet, Rfl: 1 .  furosemide (LASIX) 20 MG tablet, Take 1 tablet by mouth daily., Disp: , Rfl:  .  hydrochlorothiazide (HYDRODIURIL) 25 MG tablet, TAKE 1 TABLET BY MOUTH ONCE DAILY, Disp: 90 tablet, Rfl: 1 .  hydrocortisone (ANUSOL-HC) 2.5 % rectal cream, Place 1 application rectally 2 (two) times daily., Disp: , Rfl:  .  LINZESS 290 MCG CAPS capsule, TAKE 1 CAPSULE BY MOUTH ONCE DAILY BEFORE BREAFAST, Disp: 90 capsule, Rfl: 0 .  MAPAP 500 MG tablet, TAKE 2 TABLETS BY MOUTH 3 TIMES DAILY ASNEEDED HEADACHE, Disp: 60 tablet, Rfl: 5 .  montelukast (SINGULAIR) 10 MG tablet, TAKE 1 TABLET BY MOUTH ONCE DAILY, Disp: 90 tablet, Rfl: 1 .  naproxen (NAPROSYN) 500 MG tablet, TAKE 1 TABLET BY MOUTH TWICE DAILY, Disp: 180 tablet, Rfl: 1 .  omega-3 acid ethyl esters (LOVAZA) 1 g capsule, Take 1 capsule (1 g total) by mouth daily., Disp: 90 capsule, Rfl: 1 .  omeprazole (PRILOSEC) 40 MG capsule, TAKE 1 CAPSULE BY MOUTH ONCE DAILY, Disp: 90 capsule, Rfl: 1 .  polyethylene glycol powder (GLYCOLAX/MIRALAX) 17 GM/SCOOP powder, MIX 17 GRAMS IN 4 TO 8 OZ OF FLUID AND DRINK AT  BEDTIME, Disp: 510 g, Rfl: 0 .  potassium chloride (KLOR-CON) 20 MEQ packet, DISSOLVE 1 PACKET IN 4 OUNCES OF LIQUID AND DRINK ONCE DAILY., Disp: 90 each, Rfl: 1 .  sodium fluoride (DENTA 5000 PLUS) 1.1 % CREA dental cream, Place 1 application onto teeth every evening. Brush teeth daily for 1 minute, rinse and spit., Disp: 1 Tube, Rfl: 5 .  sulfacetamide (BLEPH-10) 10 % ophthalmic solution, PLACE 2 DROPS INTO RIGHT EYE 4 TIMES DAILY, Disp: 15 mL, Rfl: 5 .  Sunscreens (AVEENO CLEAR COMPLEXION BB) CREA, Apply as directed bid, Disp: 75 mL, Rfl: 11 .  triamcinolone cream (KENALOG)  0.1 %, Apply 1 application topically 2 (two) times daily., Disp: 30 g, Rfl: 0 .  Witch Hazel (TUCKS) 50 % PADS, Apply 50 % topically as needed (As needed for cleansing and hemorrhoid discomfort.)., Disp: , Rfl:   Review of Systems  Constitutional: Negative.   Respiratory: Negative.   Cardiovascular: Negative.   Gastrointestinal: Negative.   Neurological: Negative for dizziness, light-headedness and headaches.    Social History   Tobacco Use  . Smoking status: Never Smoker  . Smokeless tobacco: Never Used  Substance Use Topics  . Alcohol use: No      Objective:   BP 108/73 (BP Location: Left Arm, Patient Position: Sitting, Cuff Size: Large)   Pulse 85   Temp (!) 97.2 F (36.2 C) (Temporal)   Wt 198 lb (89.8 kg)   BMI 30.11 kg/m  Vitals:   05/26/19 1557  BP: 108/73  Pulse: 85  Temp: (!) 97.2 F (36.2 C)  TempSrc: Temporal  Weight: 198 lb (89.8 kg)  Body mass index is 30.11 kg/m.   Physical Exam Vitals reviewed.  Constitutional:      General: She is not in acute distress.    Appearance: Normal appearance. She is well-developed and normal weight. She is not ill-appearing or diaphoretic.  HENT:     Head: Normocephalic and atraumatic.     Right Ear: Hearing, tympanic membrane, ear canal and external ear normal.     Left Ear: Hearing, tympanic membrane, ear canal and external ear normal.     Nose: Nose normal.     Mouth/Throat:     Mouth: Mucous membranes are moist.     Pharynx: Oropharynx is clear. Uvula midline. No oropharyngeal exudate or posterior oropharyngeal erythema.  Eyes:     General: No scleral icterus.       Right eye: No discharge.        Left eye: No discharge.     Extraocular Movements: Extraocular movements intact.     Conjunctiva/sclera: Conjunctivae normal.     Pupils: Pupils are equal, round, and reactive to light.  Neck:     Thyroid: No thyromegaly.     Trachea: No tracheal deviation.  Cardiovascular:     Rate and Rhythm: Normal rate and  regular rhythm.     Pulses: Normal pulses.     Heart sounds: Normal heart sounds. No murmur. No friction rub. No gallop.   Pulmonary:     Effort: Pulmonary effort is normal. No respiratory distress.     Breath sounds: Normal breath sounds. No stridor. No wheezing or rales.  Musculoskeletal:     Cervical back: Normal range of motion and neck supple.  Lymphadenopathy:     Cervical: No cervical adenopathy.  Skin:    General: Skin is warm and dry.  Neurological:     Mental Status: She is alert.  No results found for any visits on 05/26/19.     Assessment & Plan    1. Iron deficiency anemia secondary to inadequate dietary iron intake H/O this. Will check labs as below and f/u pending results. - CBC w/Diff/Platelet - Fe+TIBC+Fer  2. Epilepsy, not refractory ([place]) Due for lab check for high risk medication, tegretol. Will forward labs to Rockwell Germany, NP, Neurology.  - Carbamazepine, Free and Total - Comprehensive Metabolic Panel (CMET)  3. Long-term use of high-risk medication Will check labs as below and f/u pending results. - Carbamazepine, Free and Total - Comprehensive Metabolic Panel (CMET)  4. Elevated carbamazepine level H/O this but patient asymptomatic and levels stable.  - Carbamazepine, Free and Total - Comprehensive Metabolic Panel (CMET)  5. Neutropenia, unspecified type ([place]) H/O this. Will check labs as below and f/u pending results. - CBC w/Diff/Platelet - Fe+TIBC+Fer  6. Cough With seasonal allergies. Stable. Diagnosis pulled for medication refill. Continue current medical treatment plan. - brompheniramine-pseudoephedrine-DM 30-2-10 MG/5ML syrup; Take 5 mLs by mouth 4 (four) times daily as needed.  Dispense: 120 mL; Refill: 0     Mar Daring, PA-C  [place]

## 2019-05-30 DIAGNOSIS — R7889 Finding of other specified substances, not normally found in blood: Secondary | ICD-10-CM | POA: Diagnosis not present

## 2019-05-30 DIAGNOSIS — Z79899 Other long term (current) drug therapy: Secondary | ICD-10-CM | POA: Diagnosis not present

## 2019-05-30 DIAGNOSIS — D508 Other iron deficiency anemias: Secondary | ICD-10-CM | POA: Diagnosis not present

## 2019-05-30 DIAGNOSIS — G40909 Epilepsy, unspecified, not intractable, without status epilepticus: Secondary | ICD-10-CM | POA: Diagnosis not present

## 2019-05-30 DIAGNOSIS — D709 Neutropenia, unspecified: Secondary | ICD-10-CM | POA: Diagnosis not present

## 2019-05-31 LAB — COMPREHENSIVE METABOLIC PANEL
ALT: 9 IU/L (ref 0–32)
AST: 14 IU/L (ref 0–40)
Albumin/Globulin Ratio: 1.2 (ref 1.2–2.2)
Albumin: 3.7 g/dL — ABNORMAL LOW (ref 3.8–4.8)
Alkaline Phosphatase: 100 IU/L (ref 39–117)
BUN/Creatinine Ratio: 15 (ref 9–23)
BUN: 12 mg/dL (ref 6–20)
Bilirubin Total: <0.2 mg/dL (ref 0.0–1.2)
CO2: 19 mmol/L — ABNORMAL LOW (ref 20–29)
Calcium: 9.2 mg/dL (ref 8.7–10.2)
Chloride: 104 mmol/L (ref 96–106)
Creatinine, Ser: 0.79 mg/dL (ref 0.57–1.00)
GFR calc Af Amer: 111 mL/min/{1.73_m2} (ref >59)
GFR calc non Af Amer: 97 mL/min/{1.73_m2} (ref >59)
Globulin, Total: 3.2 g/dL (ref 1.5–4.5)
Glucose: 89 mg/dL (ref 65–99)
Potassium: 3.4 mmol/L — ABNORMAL LOW (ref 3.5–5.2)
Sodium: 134 mmol/L (ref 134–144)
Total Protein: 6.9 g/dL (ref 6.0–8.5)

## 2019-05-31 LAB — CBC WITH DIFFERENTIAL/PLATELET
Basophils Absolute: 0 10*3/uL (ref 0.0–0.2)
Basos: 0 %
EOS (ABSOLUTE): 0 10*3/uL (ref 0.0–0.4)
Eos: 2 %
Hematocrit: 32.3 % — ABNORMAL LOW (ref 34.0–46.6)
Hemoglobin: 9.9 g/dL — ABNORMAL LOW (ref 11.1–15.9)
Immature Grans (Abs): 0 10*3/uL (ref 0.0–0.1)
Immature Granulocytes: 0 %
Lymphocytes Absolute: 1 10*3/uL (ref 0.7–3.1)
Lymphs: 38 %
MCH: 24.1 pg — ABNORMAL LOW (ref 26.6–33.0)
MCHC: 30.7 g/dL — ABNORMAL LOW (ref 31.5–35.7)
MCV: 79 fL (ref 79–97)
Monocytes Absolute: 0.4 10*3/uL (ref 0.1–0.9)
Monocytes: 14 %
Neutrophils Absolute: 1.3 10*3/uL — ABNORMAL LOW (ref 1.4–7.0)
Neutrophils: 46 %
Platelets: 194 10*3/uL (ref 150–450)
RBC: 4.11 x10E6/uL (ref 3.77–5.28)
RDW: 15.3 % (ref 11.7–15.4)
WBC: 2.8 10*3/uL — ABNORMAL LOW (ref 3.4–10.8)

## 2019-05-31 LAB — IRON,TIBC AND FERRITIN PANEL
Ferritin: 7 ng/mL — ABNORMAL LOW (ref 15–150)
Iron Saturation: 8 % — CL (ref 15–55)
Iron: 22 ug/dL — ABNORMAL LOW (ref 27–159)
Total Iron Binding Capacity: 274 ug/dL (ref 250–450)
UIBC: 252 ug/dL (ref 131–425)

## 2019-05-31 LAB — CARBAMAZEPINE, FREE AND TOTAL
Carbamazepine, Free: 3.9 ug/mL (ref 0.6–4.2)
Carbamazepine, Total: 15.3 ug/mL (ref 4.0–12.0)

## 2019-06-01 ENCOUNTER — Telehealth: Payer: Self-pay

## 2019-06-01 ENCOUNTER — Telehealth: Payer: Self-pay | Admitting: Physician Assistant

## 2019-06-01 DIAGNOSIS — D508 Other iron deficiency anemias: Secondary | ICD-10-CM

## 2019-06-01 NOTE — Telephone Encounter (Signed)
-----   Message from Margaretann Loveless, PA-C sent at 05/31/2019  6:34 PM EST ----- Hemoglobin is decreasing again and iron stores are low. Recommend to start iron supplementation with either ferrous sulfate or ferrous gluconate (easier on the stomach) twice daily. Tegretol level is stable. Kidney and liver function is normal. Potassium is borderline low. Could increase by adding potassium rich foods in diet such as bananas, sweet potatoes, broccoli, leafy greens, etc. Also her protein level is also just under normal. Again this is not a concerning low, just something we will monitor.

## 2019-06-01 NOTE — Telephone Encounter (Signed)
Mother called and was read lab note By [name] PA. She is requesting RX for ferrous sulfate or ferrous gluconate.  She has no preference but would like you to decide which is best with her daughters other medications.

## 2019-06-01 NOTE — Telephone Encounter (Signed)
Tried calling; no answer.  PEC please advise Pt's mother Sharion Dove of lab results below.   Thanks,   -Vernona Rieger

## 2019-06-02 ENCOUNTER — Other Ambulatory Visit: Payer: Self-pay | Admitting: Physician Assistant

## 2019-06-02 DIAGNOSIS — K219 Gastro-esophageal reflux disease without esophagitis: Secondary | ICD-10-CM

## 2019-06-02 DIAGNOSIS — K5900 Constipation, unspecified: Secondary | ICD-10-CM

## 2019-06-02 MED ORDER — FERROUS GLUCONATE 324 (38 FE) MG PO TABS: 324.0000 mg | ORAL_TABLET | Freq: Two times a day (BID) | ORAL | 0 refills | Status: DC

## 2019-06-02 NOTE — Addendum Note (Signed)
Addended by: Margaretann Loveless on: 06/02/2019 12:30 PM   Modules accepted: Orders

## 2019-06-02 NOTE — Telephone Encounter (Signed)
ferrous gluconate sent to [facility]

## 2019-06-02 NOTE — Telephone Encounter (Signed)
Patient returned call and would like rx sent to   [facility] - [place], [place] - [address] Phone:  [phone]  Fax:  [phone]      Mother would like a follow up call to discuss the changes, if unable to answer please leave a detail message.

## 2019-06-02 NOTE — Telephone Encounter (Signed)
Patient's mother [name] advised.

## 2019-07-01 ENCOUNTER — Other Ambulatory Visit: Payer: Self-pay | Admitting: Physician Assistant

## 2019-07-01 DIAGNOSIS — K219 Gastro-esophageal reflux disease without esophagitis: Secondary | ICD-10-CM

## 2019-07-11 ENCOUNTER — Telehealth: Payer: Self-pay

## 2019-07-11 DIAGNOSIS — J0140 Acute pansinusitis, unspecified: Secondary | ICD-10-CM

## 2019-07-11 MED ORDER — AMOXICILLIN-POT CLAVULANATE 875-125 MG PO TABS: 1.0000 | ORAL_TABLET | Freq: Two times a day (BID) | ORAL | 0 refills | Status: DC

## 2019-07-11 NOTE — Addendum Note (Signed)
Addended by: Margaretann Loveless on: 07/11/2019 03:39 PM   Modules accepted: Orders

## 2019-07-11 NOTE — Telephone Encounter (Signed)
Augmentin sent in.  

## 2019-07-11 NOTE — Telephone Encounter (Signed)
Copied from CRM [id number]. Topic: General - Other >> Jul 11, 2019  8:55 AM Elliot Gault wrote: As per mother stating PCP is aware of the ongoing concern of patient experiencing neck pain and sore throat requesting antibiotics. Mother states PCP does not require an appt. Please advise

## 2019-07-12 ENCOUNTER — Telehealth: Payer: Self-pay

## 2019-07-12 NOTE — Progress Notes (Deleted)
Established patient visit    Patient: Joanna Robinson   DOB: 03-Dec-1980   39 y.o. Female  MRN: [id number] Visit Date: 07/12/2019  Today's healthcare provider: Mar Daring, PA-C   No chief complaint on file.  Subjective    HPI ***  {Show patient history (optional):23778::" "}   Medications: Outpatient Medications Prior to Visit  Medication Sig  . acetaZOLAMIDE (DIAMOX) 250 MG tablet TAKE 1 TABLET BY MOUTH TWICE DAILY  . Alpha-D-Galactosidase (BEANO) TABS Take by mouth as needed (2-3 Tabs as needed prior to meal that may cause bloating.).  Marland Kitchen Aluminum & Magnesium Hydroxide (MYLANTA ULTIMATE STRENGTH) 500-500 MG/5ML SUSP MYLANTA ULTIMATE STRENGTH, 500-500MG /5ML (Oral Suspension)  1 TID as needed for 0 days  Quantity: 0.00;  Refills: 0   Ordered :14-Mar-2010  Celene Kras, MA, Anastasiya ;  Started 19-October-2008 Active Comments: Medication taken as needed.   Marland Kitchen amoxicillin-clavulanate (AUGMENTIN) 875-125 MG tablet Take 1 tablet by mouth 2 (two) times daily.  . Azelastine-Fluticasone 137-50 MCG/ACT SUSP USE 1 SPRAY INTO EACH NOSTRIL TWICE DAILY  . benzoyl peroxide-erythromycin (BENZAMYCIN) gel Apply topically 2 (two) times daily.  . brompheniramine-pseudoephedrine-DM 30-2-10 MG/5ML syrup Take 5 mLs by mouth 4 (four) times daily as needed.  . CARBATROL 200 MG 12 hr capsule TAKE 2 CAPSULES BY MOUTH ONCE EVERY MORNING AND 1 CAPSULE ONCE EVERY EVENING  . CARBATROL 300 MG 12 hr capsule TAKE 1 CAPSULE BY MOUTH AT BEDTIME  . cloNIDine (CATAPRES) 0.2 MG tablet TAKE 1 TABLET BY MOUTH AT BEDTIME  . Dextromethorphan-Menthol (DELSYM COUGH RELIEF MT) Use as directed in the mouth or throat as needed (2 Tsp. by mouth every 12 hours as needed for cough.). Reported on 04/07/2015  . ferrous gluconate (FERGON) 324 MG tablet Take 1 tablet (324 mg total) by mouth 2 (two) times daily with a meal.  . fexofenadine (ALLEGRA) 180 MG tablet TAKE 1 TABLET BY MOUTH ONCE DAILY  . fluvoxaMINE (LUVOX)  100 MG tablet TAKE 1 TABLET BY MOUTH TWICE DAILY  . furosemide (LASIX) 20 MG tablet Take 1 tablet by mouth daily.  . hydrochlorothiazide (HYDRODIURIL) 25 MG tablet TAKE 1 TABLET BY MOUTH ONCE DAILY  . hydrocortisone (ANUSOL-HC) 2.5 % rectal cream Place 1 application rectally 2 (two) times daily.  Marland Kitchen LINZESS 290 MCG CAPS capsule TAKE 1 CAPSULE BY MOUTH ONCE DAILY BEFORE BREAFAST  . MAPAP 500 MG tablet TAKE 2 TABLETS BY MOUTH 3 TIMES DAILY ASNEEDED HEADACHE  . montelukast (SINGULAIR) 10 MG tablet TAKE 1 TABLET BY MOUTH ONCE DAILY  . naproxen (NAPROSYN) 500 MG tablet TAKE 1 TABLET BY MOUTH TWICE DAILY  . omega-3 acid ethyl esters (LOVAZA) 1 g capsule Take 1 capsule (1 g total) by mouth daily.  Marland Kitchen omeprazole (PRILOSEC) 40 MG capsule TAKE 1 CAPSULE BY MOUTH ONCE DAILY  . polyethylene glycol powder (GLYCOLAX/MIRALAX) 17 GM/SCOOP powder MIX 17 GRAMS IN 4 TO 8 OZ OF FLUID AND DRINK AT BEDTIME  . potassium chloride (KLOR-CON) 20 MEQ packet DISSOLVE 1 PACKET IN 4 OUNCES OF LIQUID AND DRINK ONCE DAILY.  . sodium fluoride (DENTA 5000 PLUS) 1.1 % CREA dental cream Place 1 application onto teeth every evening. Brush teeth daily for 1 minute, rinse and spit.  Marland Kitchen sulfacetamide (BLEPH-10) 10 % ophthalmic solution PLACE 2 DROPS INTO RIGHT EYE 4 TIMES DAILY  . Sunscreens (AVEENO CLEAR COMPLEXION BB) CREA Apply as directed bid  . triamcinolone cream (KENALOG) 0.1 % Apply 1 application topically 2 (two) times daily.  Verlee Monte (  TUCKS) 50 % PADS Apply 50 % topically as needed (As needed for cleansing and hemorrhoid discomfort.).   No facility-administered medications prior to visit.    Review of Systems  {Show previous labs (optional):23779::" "}   Objective    There were no vitals taken for this visit. {Show previous vital signs (optional):23777::" "}  Physical Exam  ***  No results found for any visits on 07/15/19.   Assessment & Plan    ***  No follow-ups on file.      {provider  attestation***:1}   Reine Just  [facility] [phone] (phone) [phone] (fax)  [facility]

## 2019-07-12 NOTE — Telephone Encounter (Signed)
Copied from CRM [id number]. Topic: General - Call Back - No Documentation >> Jul 12, 2019  9:07 AM Randol Kern wrote: [name], pt's mother is requesting a call back from Clinic/PCP. Pt has black stools, pt's mother is concerned that her iron levels are off. Wonders if she needs an Ultrasound. Pt had a runny bowel movement yesterday with black stools.  [phone]

## 2019-07-13 ENCOUNTER — Ambulatory Visit
Admission: RE | Admit: 2019-07-13 | Discharge: 2019-07-13 | Disposition: A | Discharge status: Home / Self Care | Payer: Medicare Other | Attending: Family Medicine | Admitting: Family Medicine

## 2019-07-13 ENCOUNTER — Ambulatory Visit
Admission: RE | Admit: 2019-07-13 | Discharge: 2019-07-13 | Disposition: A | Discharge status: Home / Self Care | Payer: Medicare Other | Source: Ambulatory Visit | Attending: Family Medicine | Admitting: Family Medicine

## 2019-07-13 ENCOUNTER — Ambulatory Visit (INDEPENDENT_AMBULATORY_CARE_PROVIDER_SITE_OTHER): Payer: Medicare Other | Admitting: Family Medicine

## 2019-07-13 ENCOUNTER — Other Ambulatory Visit: Payer: Self-pay

## 2019-07-13 VITALS — BP 122/68 | HR 72 | Temp 96.2°F | Wt 194.0 lb

## 2019-07-13 DIAGNOSIS — K5900 Constipation, unspecified: Secondary | ICD-10-CM | POA: Insufficient documentation

## 2019-07-13 DIAGNOSIS — R195 Other fecal abnormalities: Secondary | ICD-10-CM

## 2019-07-13 MED ORDER — LINACLOTIDE 145 MCG PO CAPS: 145.0000 ug | ORAL_CAPSULE | Freq: Every day | ORAL | 1 refills | Status: DC

## 2019-07-13 NOTE — Telephone Encounter (Signed)
Pt needs appt for this as it is new

## 2019-07-13 NOTE — Assessment & Plan Note (Signed)
Pt is having trouble with loose bowels.  Will decrease linzess to a day Order Abdominal x-ray Hemoccult card negative for blood.  Reassured mom that the dark colored stools are due to the iron supplement.

## 2019-07-13 NOTE — Progress Notes (Signed)
I,[name],acting as a scribe for Shirlee Latch, MD.,have documented all relevant documentation on the behalf of Shirlee Latch, MD,as directed by  Shirlee Latch, MD while in the presence of Shirlee Latch, MD.    Established patient visit    Patient: Joanna Robinson   DOB: 1980-12-20   39 y.o. Female  MRN: [id number] Visit Date: 07/13/2019  Today's healthcare provider: Shirlee Latch, MD   Chief Complaint  Patient presents with  . Melena   Subjective    HPI Pt's [name]) reports that Joanna Robinson has been having "Black" stools since Monday (07/11/2019).  She states Nioka had a small hard bowel movement at first then had another one the same day and described it as loose and black.  Joanna Robinson stated that pt also had two more loose bowel movements today and were also black.   Not complaining of belly pain.  No fevers.    Mother brought in stool sample for eval today  Loose stools after taking Linzess and Miralax.  Also on Augmentin  Social History   Tobacco Use  . Smoking status: Never Smoker  . Smokeless tobacco: Never Used  Substance Use Topics  . Alcohol use: No  . Drug use: No       Medications: Outpatient Medications Prior to Visit  Medication Sig  . acetaZOLAMIDE (DIAMOX) 250 MG tablet TAKE 1 TABLET BY MOUTH TWICE DAILY  . Alpha-D-Galactosidase (BEANO) TABS Take by mouth as needed (2-3 Tabs as needed prior to meal that may cause bloating.).  Marland Kitchen Aluminum & Magnesium Hydroxide (MYLANTA ULTIMATE STRENGTH) 500-500 MG/5ML SUSP MYLANTA ULTIMATE STRENGTH, 500-500MG /5ML (Oral Suspension)  1 TID as needed for 0 days  Quantity: 0.00;  Refills: 0   Ordered :14-Mar-2010  Denna Haggard, MA, Anastasiya ;  Started 19-October-2008 Active Comments: Medication taken as needed.   Marland Kitchen amoxicillin-clavulanate (AUGMENTIN) 875-125 MG tablet Take 1 tablet by mouth 2 (two) times daily.  . Azelastine-Fluticasone 137-50 MCG/ACT SUSP USE 1 SPRAY INTO EACH NOSTRIL  TWICE DAILY  . benzoyl peroxide-erythromycin (BENZAMYCIN) gel Apply topically 2 (two) times daily.  . brompheniramine-pseudoephedrine-DM 30-2-10 MG/5ML syrup Take 5 mLs by mouth 4 (four) times daily as needed.  . CARBATROL 200 MG 12 hr capsule TAKE 2 CAPSULES BY MOUTH ONCE EVERY MORNING AND 1 CAPSULE ONCE EVERY EVENING  . CARBATROL 300 MG 12 hr capsule TAKE 1 CAPSULE BY MOUTH AT BEDTIME  . cloNIDine (CATAPRES) 0.2 MG tablet TAKE 1 TABLET BY MOUTH AT BEDTIME  . Dextromethorphan-Menthol (DELSYM COUGH RELIEF MT) Use as directed in the mouth or throat as needed (2 Tsp. by mouth every 12 hours as needed for cough.). Reported on 04/07/2015  . ferrous gluconate (FERGON) 324 MG tablet Take 1 tablet (324 mg total) by mouth 2 (two) times daily with a meal.  . fexofenadine (ALLEGRA) 180 MG tablet TAKE 1 TABLET BY MOUTH ONCE DAILY  . fluvoxaMINE (LUVOX) 100 MG tablet TAKE 1 TABLET BY MOUTH TWICE DAILY  . furosemide (LASIX) 20 MG tablet Take 1 tablet by mouth daily.  . hydrochlorothiazide (HYDRODIURIL) 25 MG tablet TAKE 1 TABLET BY MOUTH ONCE DAILY  . hydrocortisone (ANUSOL-HC) 2.5 % rectal cream Place 1 application rectally 2 (two) times daily.  Marland Kitchen MAPAP 500 MG tablet TAKE 2 TABLETS BY MOUTH 3 TIMES DAILY ASNEEDED HEADACHE  . montelukast (SINGULAIR) 10 MG tablet TAKE 1 TABLET BY MOUTH ONCE DAILY  . naproxen (NAPROSYN) 500 MG tablet TAKE 1 TABLET BY MOUTH TWICE DAILY  . omega-3 acid ethyl  esters (LOVAZA) 1 g capsule Take 1 capsule (1 g total) by mouth daily.  Marland Kitchen omeprazole (PRILOSEC) 40 MG capsule TAKE 1 CAPSULE BY MOUTH ONCE DAILY  . polyethylene glycol powder (GLYCOLAX/MIRALAX) 17 GM/SCOOP powder MIX 17 GRAMS IN 4 TO 8 OZ OF FLUID AND DRINK AT BEDTIME  . potassium chloride (KLOR-CON) 20 MEQ packet DISSOLVE 1 PACKET IN 4 OUNCES OF LIQUID AND DRINK ONCE DAILY.  . sodium fluoride (DENTA 5000 PLUS) 1.1 % CREA dental cream Place 1 application onto teeth every evening. Brush teeth daily for 1 minute, rinse and spit.   Marland Kitchen sulfacetamide (BLEPH-10) 10 % ophthalmic solution PLACE 2 DROPS INTO RIGHT EYE 4 TIMES DAILY  . Sunscreens (AVEENO CLEAR COMPLEXION BB) CREA Apply as directed bid  . triamcinolone cream (KENALOG) 0.1 % Apply 1 application topically 2 (two) times daily.  . [DISCONTINUED] LINZESS 290 MCG CAPS capsule TAKE 1 CAPSULE BY MOUTH ONCE DAILY BEFORE BREAFAST  . Witch Hazel (TUCKS) 50 % PADS Apply 50 % topically as needed (As needed for cleansing and hemorrhoid discomfort.).   No facility-administered medications prior to visit.    Review of Systems  Constitutional: Negative.   Gastrointestinal: Negative for abdominal distention, abdominal pain, anal bleeding, blood in stool, constipation, diarrhea, nausea, rectal pain and vomiting.    Last CBC Lab Results  Component Value Date   WBC 2.8 (L) 05/30/2019   HGB 9.9 (L) 05/30/2019   HCT 32.3 (L) 05/30/2019   MCV 79 05/30/2019   MCH 24.1 (L) 05/30/2019   RDW 15.3 05/30/2019   PLT 194 05/30/2019       Objective    BP 122/68 (BP Location: Left Arm, Patient Position: Sitting, Cuff Size: Large)   Pulse 72   Temp (!) 96.2 F (35.7 C) (Temporal)   Wt 194 lb (88 kg)   LMP 06/22/2019   SpO2 99%   BMI 29.50 kg/m  BP Readings from Last 3 Encounters:  07/13/19 122/68  05/26/19 108/73  02/07/19 100/70   Wt Readings from Last 3 Encounters:  07/13/19 194 lb (88 kg)  05/26/19 198 lb (89.8 kg)  12/17/18 194 lb 3.2 oz (88.1 kg)      Physical Exam Vitals reviewed.  Constitutional:      General: She is not in acute distress.    Appearance: Normal appearance. She is not diaphoretic.  HENT:     Head: Normocephalic and atraumatic.  Eyes:     General: No scleral icterus.    Conjunctiva/sclera: Conjunctivae normal.  Cardiovascular:     Rate and Rhythm: Normal rate and regular rhythm.     Pulses: Normal pulses.     Heart sounds: Normal heart sounds. No murmur.  Pulmonary:     Effort: Pulmonary effort is normal. No respiratory distress.      Breath sounds: Normal breath sounds. No wheezing.  Abdominal:     General: Bowel sounds are normal. There is no distension.     Palpations: Abdomen is soft. There is no mass.     Tenderness: There is no abdominal tenderness. There is no guarding or rebound.  Musculoskeletal:     Right lower leg: No edema.     Left lower leg: No edema.  Skin:    General: Skin is warm and dry.     Findings: No rash.  Neurological:     Mental Status: She is alert and oriented to person, place, and time. Mental status is at baseline.     Gait: Gait abnormal.  Psychiatric:  Mood and Affect: Affect is flat.        Speech: Speech is delayed.        Behavior: Behavior is slowed.      No results found for any visits on 07/13/19.   Assessment & Plan     Problem List Items Addressed This Visit      Other   Constipation - Primary    Pt is having trouble with loose bowels.  Will decrease linzess to a day Order Abdominal x-ray Hemoccult card negative for blood.  Reassured mom that the dark colored stools are due to the iron supplement.        Relevant Orders   DG Abd 2 Views   Dark stools    - reviewed stool sample and no black stools noted, dark brown and loose, hemocult negative - may need to increase linzess again when stopping Augmentin   Return for as scheduled.      I, Shirlee Latch, MD, have reviewed all documentation for this visit. The documentation on 07/13/19 for the exam, diagnosis, procedures, and orders are all accurate and complete.   Gianluca Chhim, Marzella Schlein, MD, MPH [facility]

## 2019-07-14 ENCOUNTER — Telehealth: Payer: Self-pay | Admitting: Physician Assistant

## 2019-07-14 ENCOUNTER — Ambulatory Visit: Payer: Self-pay | Admitting: Physician Assistant

## 2019-07-14 ENCOUNTER — Telehealth: Payer: Self-pay

## 2019-07-14 ENCOUNTER — Ambulatory Visit: Payer: Self-pay | Admitting: Family Medicine

## 2019-07-14 NOTE — Telephone Encounter (Signed)
LMTCB, P[facility] Triage Nurse may give patient results  

## 2019-07-14 NOTE — Telephone Encounter (Signed)
-----   Message from Erasmo Downer, MD sent at 07/14/2019  9:44 AM EDT ----- Normal XRay

## 2019-07-14 NOTE — Telephone Encounter (Signed)
Patients mother returned call and was give xray result done 07/13/19 by Dr Donzetta Kohut .  She verbalized understanding.  She has no questions.

## 2019-07-15 ENCOUNTER — Ambulatory Visit: Payer: Medicare Other | Admitting: Physician Assistant

## 2019-07-28 ENCOUNTER — Other Ambulatory Visit: Payer: Self-pay | Admitting: Physician Assistant

## 2019-07-28 DIAGNOSIS — D508 Other iron deficiency anemias: Secondary | ICD-10-CM

## 2019-07-28 DIAGNOSIS — K219 Gastro-esophageal reflux disease without esophagitis: Secondary | ICD-10-CM

## 2019-08-01 ENCOUNTER — Other Ambulatory Visit: Payer: Self-pay | Admitting: Family Medicine

## 2019-08-11 ENCOUNTER — Telehealth: Payer: Self-pay

## 2019-08-11 NOTE — Telephone Encounter (Signed)
Copied from CRM [id number]. Topic: General - Other >> Aug 11, 2019  1:08 PM Lyn Hollingshead D wrote: Reason for CRM: PT mother call asking if is ok per PT to take the covid vaccine / please advise

## 2019-08-12 NOTE — Telephone Encounter (Signed)
Please advise 

## 2019-08-15 NOTE — Telephone Encounter (Signed)
LMTCB if patient calls back ok for the [facility] nurse to relay the message. Thanks.

## 2019-08-15 NOTE — Telephone Encounter (Signed)
Patient mom called again asking Dr Rosezetta Schlatter if it will be ok for her to get the covid vaccine. She is asking for a call back with an answer if possible today please Ph# [phone]

## 2019-08-15 NOTE — Telephone Encounter (Addendum)
Ms Joanna Robinson called and she was read note by [name] written 5/24 21. Mom states that her daughter has had flu and pneumonia vaccines without problems. She will have her daughter vaccinated for COVID-19 and will follow with tylenol for 24-48 hours per instructions.

## 2019-08-15 NOTE — Telephone Encounter (Signed)
Has she ever had any issues, specifically any exacerbation of her seizures with previous vaccines?  To my knowledge she has tolerated other vaccines.   The biggest risk is if she develops a fever after the vaccine, which could be common, could increase her risk to have a seizure. I think it would be safe to take and may just benefit to keep tylenol in her for the first 24-48 hrs after the vaccine to hopefully prevent fever and prevent a seizure.

## 2019-08-16 ENCOUNTER — Ambulatory Visit: Payer: Medicare Other | Attending: Internal Medicine

## 2019-08-16 DIAGNOSIS — Z23 Encounter for immunization: Secondary | ICD-10-CM

## 2019-08-16 NOTE — Progress Notes (Signed)
   Covid-19 Vaccination Clinic  Name:  Joanna Robinson    MRN: [id number] DOB: Nov 18, 1980  08/16/2019  [name] was observed post Covid-19 immunization for 15 minutes without incident. She was provided with Vaccine Information Sheet and instruction to access the V-Safe system.   [name] was instructed to call 911 with any severe reactions post vaccine: Marland Kitchen Difficulty breathing  . Swelling of face and throat  . A fast heartbeat  . A bad rash all over body  . Dizziness and weakness   Immunizations Administered    Name Date Dose VIS Date Route   Pfizer COVID-19 Vaccine 08/16/2019 12:04 PM 0.3 mL 05/18/2018 Intramuscular   Manufacturer: [employer], [employer]   Lot: [id number]   NDC: [address]-1

## 2019-08-24 ENCOUNTER — Other Ambulatory Visit: Payer: Self-pay | Admitting: Physician Assistant

## 2019-08-24 DIAGNOSIS — E876 Hypokalemia: Secondary | ICD-10-CM

## 2019-08-30 ENCOUNTER — Other Ambulatory Visit: Payer: Self-pay | Admitting: Family Medicine

## 2019-08-30 NOTE — Telephone Encounter (Signed)
Requested Prescriptions  Pending Prescriptions Disp Refills  . LINZESS 145 MCG CAPS capsule [Pharmacy Med Name: LINZESS 145 MCG CAP] 30 capsule 1    Sig: TAKE 1 CAPSULE BY MOUTH ONCE DAILY     Gastroenterology: Irritable Bowel Syndrome Passed - 08/30/2019  1:09 PM      Passed - Valid encounter within last 12 months    Recent Outpatient Visits          1 month ago Constipation, unspecified constipation type   [facility], Marzella Schlein, MD   3 months ago Iron deficiency anemia secondary to inadequate dietary iron intake   [facility], [place], [place]   6 months ago Need for influenza vaccination   [facility] [place], Alessandra Bevels, [place]   8 months ago Pre-op examination   [facility] [name] M, [place]   11 months ago Annual physical exam   [facility] [place], Alessandra Bevels, [place]      Future Appointments            In 1 week Rosezetta Schlatter, Alessandra Be[place], PA-C [facility]

## 2019-09-06 ENCOUNTER — Ambulatory Visit: Payer: Medicare Other | Attending: Internal Medicine

## 2019-09-06 DIAGNOSIS — Z23 Encounter for immunization: Secondary | ICD-10-CM

## 2019-09-06 NOTE — Progress Notes (Signed)
   Covid-19 Vaccination Clinic  Name:  NEVAEH KORTE    MRN: [id number] DOB: 07-Sep-1980  09/06/2019  Ms. Leisure was observed post Covid-19 immunization for 15 minutes without incident. She was provided with Vaccine Information Sheet and instruction to access the V-Safe system.   Ms. Cedar was instructed to call 911 with any severe reactions post vaccine: Marland Kitchen Difficulty breathing  . Swelling of face and throat  . A fast heartbeat  . A bad rash all over body  . Dizziness and weakness   Immunizations Administered    Name Date Dose VIS Date Route   Pfizer COVID-19 Vaccine 09/06/2019 11:22 AM 0.3 mL 05/18/2018 Intramuscular   Manufacturer: [employer], [employer]   Lot: [id number]   NDC: [address]-6

## 2019-09-07 NOTE — Progress Notes (Deleted)
Established patient visit   Patient: Joanna Robinson   DOB: 1980/04/07   39 y.o. Female  MRN: [id number] Visit Date: 09/08/2019  Today's healthcare provider: Mar Daring, PA-C   No chief complaint on file.  Subjective    HPI Follow up for anemia  The patient was last seen for this 3 months ago. Changes made at last visit include start ferrous gluconate 324 tablet daily.  She reports {excellent/good/fair/poor:19665} compliance with treatment. She feels that condition is {improved/worse/unchanged:3041574}. She {is/is not:21021397} having side effects. ***  -----------------------------------------------------------------------------------------    Patient Active Problem List   Diagnosis Date Noted   Dark stools 07/13/2019   Abnormal blood chemistry 12/20/2014   Abscess of axilla 12/20/2014   Dry eye syndrome 12/20/2014   Dermoid inclusion cyst 12/20/2014   Hematoma 12/20/2014   HLD (hyperlipidemia) 12/20/2014   Decreased potassium in the blood 12/20/2014   Irregular bleeding 12/20/2014   Leukopenia 12/20/2014   Malaise and fatigue 12/20/2014   Headache 12/18/2014   Acne 10/30/2014   Constipation 09/18/2014   Neutropenia ([place]) 09/18/2014   Elevated carbamazepine level 01/25/2014   Active autistic disorder 01/10/2013   Long-term use of high-risk medication 01/10/2013   Dysmenorrhea 07/10/2008   Mechanical and motor problems with internal organs 11/08/2007   Allergic rhinitis 08/20/2007   External hemorrhoids without complication 21/30/8657   Edema 12/26/2003   Epilepsy, not refractory ([place]) 12/26/2003   Intellectual disability 12/26/2003   Social History   Tobacco Use   Smoking status: Never Smoker   Smokeless tobacco: Never Used  Substance Use Topics   Alcohol use: No   Drug use: No       Medications: Outpatient Medications Prior to Visit  Medication Sig   acetaZOLAMIDE (DIAMOX) 250 MG tablet TAKE 1 TABLET  BY MOUTH TWICE DAILY   Alpha-D-Galactosidase (BEANO) TABS Take by mouth as needed (2-3 Tabs as needed prior to meal that may cause bloating.).   Aluminum & Magnesium Hydroxide (MYLANTA ULTIMATE STRENGTH) 500-500 MG/5ML SUSP MYLANTA ULTIMATE STRENGTH, 500-500MG /5ML (Oral Suspension)  1 TID as needed for 0 days  Quantity: 0.00;  Refills: 0   Ordered :14-Mar-2010  Celene Kras, MA, Anastasiya ;  Started 19-October-2008 Active Comments: Medication taken as needed.    amoxicillin-clavulanate (AUGMENTIN) 875-125 MG tablet Take 1 tablet by mouth 2 (two) times daily.   Azelastine-Fluticasone 137-50 MCG/ACT SUSP USE 1 SPRAY INTO EACH NOSTRIL TWICE DAILY   benzoyl peroxide-erythromycin (BENZAMYCIN) gel Apply topically 2 (two) times daily.   brompheniramine-pseudoephedrine-DM 30-2-10 MG/5ML syrup Take 5 mLs by mouth 4 (four) times daily as needed.   CARBATROL 200 MG 12 hr capsule TAKE 2 CAPSULES BY MOUTH ONCE EVERY MORNING AND 1 CAPSULE ONCE EVERY EVENING   CARBATROL 300 MG 12 hr capsule TAKE 1 CAPSULE BY MOUTH AT BEDTIME   cloNIDine (CATAPRES) 0.2 MG tablet TAKE 1 TABLET BY MOUTH AT BEDTIME   Dextromethorphan-Menthol (DELSYM COUGH RELIEF MT) Use as directed in the mouth or throat as needed (2 Tsp. by mouth every 12 hours as needed for cough.). Reported on 04/07/2015   ferrous gluconate (FERGON) 324 MG tablet TAKE 1 TABLET BY MOUTH TWICE DAILY WITH MEALS   fexofenadine (ALLEGRA) 180 MG tablet TAKE 1 TABLET BY MOUTH ONCE DAILY   fluvoxaMINE (LUVOX) 100 MG tablet TAKE 1 TABLET BY MOUTH TWICE DAILY   furosemide (LASIX) 20 MG tablet Take 1 tablet by mouth daily.   hydrochlorothiazide (HYDRODIURIL) 25 MG tablet TAKE 1 TABLET BY MOUTH ONCE DAILY  hydrocortisone (ANUSOL-HC) 2.5 % rectal cream Place 1 application rectally 2 (two) times daily.   LINZESS 145 MCG CAPS capsule TAKE 1 CAPSULE BY MOUTH ONCE DAILY   MAPAP 500 MG tablet TAKE 2 TABLETS BY MOUTH 3 TIMES DAILY ASNEEDED HEADACHE    montelukast (SINGULAIR) 10 MG tablet TAKE 1 TABLET BY MOUTH ONCE DAILY   naproxen (NAPROSYN) 500 MG tablet TAKE 1 TABLET BY MOUTH TWICE DAILY   omega-3 acid ethyl esters (LOVAZA) 1 g capsule Take 1 capsule (1 g total) by mouth daily.   omeprazole (PRILOSEC) 40 MG capsule TAKE 1 CAPSULE BY MOUTH ONCE DAILY   polyethylene glycol powder (GLYCOLAX/MIRALAX) 17 GM/SCOOP powder MIX 17 GRAMS IN 4 TO 8 OZ OF FLUID AND DRINK AT BEDTIME   potassium chloride (KLOR-CON) 20 MEQ packet DISSOLVE 1 PACKET IN 4 OUNCES OF LIQUID AND DRINK ONCE DAILY.   sodium fluoride (DENTA 5000 PLUS) 1.1 % CREA dental cream Place 1 application onto teeth every evening. Brush teeth daily for 1 minute, rinse and spit.   sulfacetamide (BLEPH-10) 10 % ophthalmic solution PLACE 2 DROPS INTO RIGHT EYE 4 TIMES DAILY   Sunscreens (AVEENO CLEAR COMPLEXION BB) CREA Apply as directed bid   triamcinolone cream (KENALOG) 0.1 % Apply 1 application topically 2 (two) times daily.   Witch Hazel (TUCKS) 50 % PADS Apply 50 % topically as needed (As needed for cleansing and hemorrhoid discomfort.).   No facility-administered medications prior to visit.    Review of Systems  Last CBC Lab Results  Component Value Date   WBC 2.8 (L) 05/30/2019   HGB 9.9 (L) 05/30/2019   HCT 32.3 (L) 05/30/2019   MCV 79 05/30/2019   MCH 24.1 (L) 05/30/2019   RDW 15.3 05/30/2019   PLT 194 05/30/2019      Objective    There were no vitals taken for this visit. BP Readings from Last 3 Encounters:  07/13/19 122/68  05/26/19 108/73  02/07/19 100/70   Wt Readings from Last 3 Encounters:  07/13/19 194 lb (88 kg)  05/26/19 198 lb (89.8 kg)  12/17/18 194 lb 3.2 oz (88.1 kg)      Physical Exam  ***  No results found for any visits on 09/08/19.  Assessment & Plan     ***  No follow-ups on file.      {provider attestation***:1}   Reine Just  [facility] [phone] (phone) [phone] (fax)  [facility]

## 2019-09-08 ENCOUNTER — Ambulatory Visit: Payer: Medicare Other | Admitting: Physician Assistant

## 2019-09-19 ENCOUNTER — Other Ambulatory Visit: Payer: Self-pay | Admitting: Physician Assistant

## 2019-09-19 ENCOUNTER — Ambulatory Visit: Payer: Medicare Other | Admitting: Physician Assistant

## 2019-09-19 DIAGNOSIS — K5900 Constipation, unspecified: Secondary | ICD-10-CM

## 2019-09-19 NOTE — Telephone Encounter (Signed)
Requested Prescriptions  Pending Prescriptions Disp Refills  . polyethylene glycol powder (GLYCOLAX/MIRALAX) 17 GM/SCOOP powder [Pharmacy Med Name: POLYETHYLENE GLYCOL 3350 17 GM/SCOO] 510 g 1    Sig: MIX 17 GRAMS IN 4 TO 8 OZ OF FLUID AND DRINK AT BEDTIME     Gastroenterology:  Laxatives Passed - 09/19/2019  4:52 PM      Passed - Valid encounter within last 12 months    Recent Outpatient Visits          2 months ago Constipation, unspecified constipation type   [facility], Marzella Schlein, [place]   3 months ago Iron deficiency anemia secondary to inadequate dietary iron intake   [facility], [place], [place]   7 months ago Need for influenza vaccination   [facility] [name] M, [place]   9 months ago Pre-op examination   [facility] [name] M, [place]   11 months ago Annual physical exam   [facility] [place], Alessandra Bevels, [place]      Future Appointments            In 4 days Rosezetta Schlatter, [place], PA-C [facility]

## 2019-09-23 ENCOUNTER — Ambulatory Visit: Payer: Medicare Other | Admitting: Physician Assistant

## 2019-09-23 ENCOUNTER — Other Ambulatory Visit: Payer: Self-pay

## 2019-09-28 ENCOUNTER — Encounter: Payer: Self-pay | Admitting: Physician Assistant

## 2019-09-28 ENCOUNTER — Other Ambulatory Visit: Payer: Self-pay | Admitting: Physician Assistant

## 2019-09-28 ENCOUNTER — Ambulatory Visit (INDEPENDENT_AMBULATORY_CARE_PROVIDER_SITE_OTHER): Payer: Medicare Other | Admitting: Physician Assistant

## 2019-09-28 ENCOUNTER — Other Ambulatory Visit: Payer: Self-pay

## 2019-09-28 VITALS — BP 104/62 | HR 74 | Temp 98.4°F | Resp 16 | Wt 194.0 lb

## 2019-09-28 DIAGNOSIS — J0140 Acute pansinusitis, unspecified: Secondary | ICD-10-CM

## 2019-09-28 DIAGNOSIS — K5900 Constipation, unspecified: Secondary | ICD-10-CM

## 2019-09-28 DIAGNOSIS — Z79899 Other long term (current) drug therapy: Secondary | ICD-10-CM | POA: Diagnosis not present

## 2019-09-28 DIAGNOSIS — E876 Hypokalemia: Secondary | ICD-10-CM

## 2019-09-28 DIAGNOSIS — G479 Sleep disorder, unspecified: Secondary | ICD-10-CM

## 2019-09-28 DIAGNOSIS — R609 Edema, unspecified: Secondary | ICD-10-CM

## 2019-09-28 DIAGNOSIS — D508 Other iron deficiency anemias: Secondary | ICD-10-CM

## 2019-09-28 DIAGNOSIS — K921 Melena: Secondary | ICD-10-CM

## 2019-09-28 MED ORDER — AMOXICILLIN-POT CLAVULANATE 875-125 MG PO TABS: 1.0000 | ORAL_TABLET | Freq: Two times a day (BID) | ORAL | 0 refills | Status: DC

## 2019-09-28 NOTE — Progress Notes (Signed)
Established patient visit   Patient: Joanna Robinson   DOB: 1980/10/05   39 y.o. Female  MRN: [id number] Visit Date: 09/28/2019  Today's healthcare provider: Margaretann Loveless, PA-C   Chief Complaint  Patient presents with  . Follow-up   Subjective    HPI  Follow up for constipation and dark stools:  The patient was last seen for this 3 months ago. Changes made at last visit include decreasing linzess to a day.  She reports good compliance with treatment. She feels that condition is Unchanged.  Patient is still having dark stools She is not having side effects.   -----------------------------------------------------------------------------------------   Patient Active Problem List   Diagnosis Date Noted  . Dark stools 07/13/2019  . Abnormal blood chemistry 12/20/2014  . Abscess of axilla 12/20/2014  . Dry eye syndrome 12/20/2014  . Dermoid inclusion cyst 12/20/2014  . Hematoma 12/20/2014  . HLD (hyperlipidemia) 12/20/2014  . Decreased potassium in the blood 12/20/2014  . Irregular bleeding 12/20/2014  . Leukopenia 12/20/2014  . Malaise and fatigue 12/20/2014  . Headache 12/18/2014  . Acne 10/30/2014  . Constipation 09/18/2014  . Neutropenia (HCC) 09/18/2014  . Elevated carbamazepine level 01/25/2014  . Active autistic disorder 01/10/2013  . Long-term use of high-risk medication 01/10/2013  . Dysmenorrhea 07/10/2008  . Mechanical and motor problems with internal organs 11/08/2007  . Allergic rhinitis 08/20/2007  . External hemorrhoids without complication 11/13/2006  . Edema 12/26/2003  . Epilepsy, not refractory (HCC) 12/26/2003  . Intellectual disability 12/26/2003   Past Medical History:  Diagnosis Date  . Autism   . Epilepsy (HCC)   . Hemorrhoid   . Hyperlipidemia   . Seizures (HCC)        Medications: Outpatient Medications Prior to Visit  Medication Sig  . acetaZOLAMIDE (DIAMOX) 250 MG tablet TAKE 1 TABLET BY MOUTH TWICE DAILY    . Alpha-D-Galactosidase (BEANO) TABS Take by mouth as needed (2-3 Tabs as needed prior to meal that may cause bloating.).  Marland Kitchen Aluminum & Magnesium Hydroxide (MYLANTA ULTIMATE STRENGTH) 500-500 MG/5ML SUSP MYLANTA ULTIMATE STRENGTH, 500-500MG /5ML (Oral Suspension)  1 TID as needed for 0 days  Quantity: 0.00;  Refills: 0   Ordered :14-Mar-2010  Denna Haggard, MA, Anastasiya ;  Started 19-October-2008 Active Comments: Medication taken as needed.   Marland Kitchen amoxicillin-clavulanate (AUGMENTIN) 875-125 MG tablet Take 1 tablet by mouth 2 (two) times daily.  . Azelastine-Fluticasone 137-50 MCG/ACT SUSP USE 1 SPRAY INTO EACH NOSTRIL TWICE DAILY  . benzoyl peroxide-erythromycin (BENZAMYCIN) gel Apply topically 2 (two) times daily.  . brompheniramine-pseudoephedrine-DM 30-2-10 MG/5ML syrup Take 5 mLs by mouth 4 (four) times daily as needed.  . CARBATROL 200 MG 12 hr capsule TAKE 2 CAPSULES BY MOUTH ONCE EVERY MORNING AND 1 CAPSULE ONCE EVERY EVENING  . CARBATROL 300 MG 12 hr capsule TAKE 1 CAPSULE BY MOUTH AT BEDTIME  . cloNIDine (CATAPRES) 0.2 MG tablet TAKE 1 TABLET BY MOUTH AT BEDTIME  . Dextromethorphan-Menthol (DELSYM COUGH RELIEF MT) Use as directed in the mouth or throat as needed (2 Tsp. by mouth every 12 hours as needed for cough.). Reported on 04/07/2015  . ferrous gluconate (FERGON) 324 MG tablet TAKE 1 TABLET BY MOUTH TWICE DAILY WITH MEALS  . fexofenadine (ALLEGRA) 180 MG tablet TAKE 1 TABLET BY MOUTH ONCE DAILY  . fluvoxaMINE (LUVOX) 100 MG tablet TAKE 1 TABLET BY MOUTH TWICE DAILY  . furosemide (LASIX) 20 MG tablet Take 1 tablet by mouth daily.  . hydrochlorothiazide (HYDRODIURIL)  25 MG tablet TAKE 1 TABLET BY MOUTH ONCE DAILY  . hydrocortisone (ANUSOL-HC) 2.5 % rectal cream Place 1 application rectally 2 (two) times daily.  Marland Kitchen LINZESS 145 MCG CAPS capsule TAKE 1 CAPSULE BY MOUTH ONCE DAILY  . MAPAP 500 MG tablet TAKE 2 TABLETS BY MOUTH 3 TIMES DAILY ASNEEDED HEADACHE  . montelukast (SINGULAIR) 10 MG  tablet TAKE 1 TABLET BY MOUTH ONCE DAILY  . naproxen (NAPROSYN) 500 MG tablet TAKE 1 TABLET BY MOUTH TWICE DAILY  . omega-3 acid ethyl esters (LOVAZA) 1 g capsule Take 1 capsule (1 g total) by mouth daily.  Marland Kitchen omeprazole (PRILOSEC) 40 MG capsule TAKE 1 CAPSULE BY MOUTH ONCE DAILY  . polyethylene glycol powder (GLYCOLAX/MIRALAX) 17 GM/SCOOP powder MIX 17 GRAMS IN 4 TO 8 OZ OF FLUID AND DRINK AT BEDTIME  . potassium chloride (KLOR-CON) 20 MEQ packet DISSOLVE 1 PACKET IN 4 OUNCES OF LIQUID AND DRINK ONCE DAILY.  . sodium fluoride (DENTA 5000 PLUS) 1.1 % CREA dental cream Place 1 application onto teeth every evening. Brush teeth daily for 1 minute, rinse and spit.  Marland Kitchen sulfacetamide (BLEPH-10) 10 % ophthalmic solution PLACE 2 DROPS INTO RIGHT EYE 4 TIMES DAILY  . Sunscreens (AVEENO CLEAR COMPLEXION BB) CREA Apply as directed bid  . triamcinolone cream (KENALOG) 0.1 % Apply 1 application topically 2 (two) times daily.  We[place] Croon Hazel (TUCKS) 50 % PADS Apply 50 % topically as needed (As needed for cleansing and hemorrhoid discomfort.).   No facility-administered medications prior to visit.    Review of Systems  Constitutional: Negative for appetite change, chills, fatigue and fever.  HENT: Positive for congestion, postnasal drip, rhinorrhea, sinus pressure and sore throat.   Respiratory: Negative for chest tightness and shortness of breath.   Cardiovascular: Negative for chest pain and palpitations.  Gastrointestinal: Positive for abdominal pain. Negative for nausea and vomiting.       Black stools  Neurological: Negative for dizziness and weakness.    Last CBC Lab Results  Component Value Date   WBC 2.8 (L) 05/30/2019   HGB 9.9 (L) 05/30/2019   HCT 32.3 (L) 05/30/2019   MCV 79 05/30/2019   MCH 24.1 (L) 05/30/2019   RDW 15.3 05/30/2019   PLT 194 05/30/2019   Last metabolic panel Lab Results  Component Value Date   GLUCOSE 89 05/30/2019   NA 134 05/30/2019   K 3.4 (L) 05/30/2019   CL  104 05/30/2019   CO2 19 (L) 05/30/2019   BUN 12 05/30/2019   CREATININE 0.79 05/30/2019   GFRNONAA 97 05/30/2019   GFRAA 111 05/30/2019   CALCIUM 9.2 05/30/2019   PROT 6.9 05/30/2019   ALBUMIN 3.7 (L) 05/30/2019   LABGLOB 3.2 05/30/2019   AGRATIO 1.2 05/30/2019   BILITOT <0.2 05/30/2019   ALKPHOS 100 05/30/2019   AST 14 05/30/2019   ALT 9 05/30/2019      Objective    BP 104/62 (BP Location: Right Arm, Patient Position: Sitting, Cuff Size: Large)   Pulse 74   Temp 98.4 F (36.9 C) (Oral)   Resp 16   Wt 194 lb (88 kg)   SpO2 99% Comment: room air  BMI 29.50 kg/m  BP Readings from Last 3 Encounters:  09/28/19 104/62  07/13/19 122/68  05/26/19 108/73   Wt Readings from Last 3 Encounters:  09/28/19 194 lb (88 kg)  07/13/19 194 lb (88 kg)  05/26/19 198 lb (89.8 kg)      Physical Exam Vitals reviewed.  Constitutional:  General: She is not in acute distress.    Appearance: Normal appearance. She is well-developed. She is not ill-appearing or diaphoretic.  HENT:     Head: Normocephalic and atraumatic.     Right Ear: Hearing, tympanic membrane, ear canal and external ear normal.     Left Ear: Hearing, tympanic membrane, ear canal and external ear normal.     Nose: Congestion present.     Right Sinus: Maxillary sinus tenderness and frontal sinus tenderness present.     Left Sinus: Maxillary sinus tenderness and frontal sinus tenderness present.     Mouth/Throat:     Mouth: Mucous membranes are moist.     Pharynx: Oropharynx is clear. Uvula midline. Posterior oropharyngeal erythema (cobblestoning) present. No oropharyngeal exudate.  Eyes:     General: No scleral icterus.    Extraocular Movements: Extraocular movements intact.  Neck:     Thyroid: No thyromegaly.     Trachea: No tracheal deviation.  Cardiovascular:     Rate and Rhythm: Normal rate and regular rhythm.     Heart sounds: Normal heart sounds. No murmur heard.  No friction rub. No gallop.     Pulmonary:     Effort: Pulmonary effort is normal. No respiratory distress.     Breath sounds: Normal breath sounds. No stridor. No wheezing or rales.  Musculoskeletal:     Cervical back: Normal range of motion and neck supple.  Lymphadenopathy:     Cervical: Cervical adenopathy (right anterior cervical) present.  Neurological:     Mental Status: She is alert.       No results found for any visits on 09/28/19.  Assessment & Plan     1. Acute non-recurrent pansinusitis Worsening symptoms that have not responded to OTC medications. Will give augmentin as below. Continue allergy medications. Stay well hydrated and get plenty of rest. Call if no symptom improvement or if symptoms worsen. - amoxicillin-clavulanate (AUGMENTIN) 875-125 MG tablet; Take 1 tablet by mouth 2 (two) times daily.  Dispense: 20 tablet; Refill: 0  2. High risk medication use Will check labs as below and f/u pending results. - Carbamazepine Level (Tegretol), total  3. Hypokalemia Was borderline low last check. Will check labs as below and f/u pending results. - Basic Metabolic Panel (BMET)  4. Iron deficiency anemia secondary to inadequate dietary iron intake H/O this and on ferrous gluconate BID. Suspect contributing to some abdominal pain and the black stools. Will check labs as below to see if can decrease to once daily. Will f/u pending labs. - CBC - Fe+TIBC+Fer  5. Black stool See above medical treatment plan.   No follow-ups on file.      Delmer Islam, PA-C, have reviewed all documentation for this visit. The documentation on 09/28/19 for the exam, diagnosis, procedures, and orders are all accurate and complete.   Reine Just  [facility] [phone] (phone) [phone] (fax)  [facility]

## 2019-09-29 ENCOUNTER — Telehealth: Payer: Self-pay | Admitting: Physician Assistant

## 2019-09-29 ENCOUNTER — Telehealth: Payer: Self-pay

## 2019-09-29 DIAGNOSIS — R609 Edema, unspecified: Secondary | ICD-10-CM

## 2019-09-29 DIAGNOSIS — D508 Other iron deficiency anemias: Secondary | ICD-10-CM

## 2019-09-29 LAB — BASIC METABOLIC PANEL
BUN/Creatinine Ratio: 16 (ref 9–23)
BUN: 11 mg/dL (ref 6–20)
CO2: 22 mmol/L (ref 20–29)
Calcium: 9.5 mg/dL (ref 8.7–10.2)
Chloride: 100 mmol/L (ref 96–106)
Creatinine, Ser: 0.68 mg/dL (ref 0.57–1.00)
GFR calc Af Amer: 130 mL/min/{1.73_m2} (ref >59)
GFR calc non Af Amer: 113 mL/min/{1.73_m2} (ref >59)
Glucose: 85 mg/dL (ref 65–99)
Potassium: 3.4 mmol/L — ABNORMAL LOW (ref 3.5–5.2)
Sodium: 132 mmol/L — ABNORMAL LOW (ref 134–144)

## 2019-09-29 LAB — CBC
Hematocrit: 41.8 % (ref 34.0–46.6)
Hemoglobin: 13.3 g/dL (ref 11.1–15.9)
MCH: 30 pg (ref 26.6–33.0)
MCHC: 31.8 g/dL (ref 31.5–35.7)
MCV: 94 fL (ref 79–97)
Platelets: 201 10*3/uL (ref 150–450)
RBC: 4.44 x10E6/uL (ref 3.77–5.28)
RDW: 12.6 % (ref 11.7–15.4)
WBC: 3.6 10*3/uL (ref 3.4–10.8)

## 2019-09-29 LAB — IRON,TIBC AND FERRITIN PANEL
Ferritin: 32 ng/mL (ref 15–150)
Iron Saturation: 36 % (ref 15–55)
Iron: 76 ug/dL (ref 27–159)
Total Iron Binding Capacity: 214 ug/dL — ABNORMAL LOW (ref 250–450)
UIBC: 138 ug/dL (ref 131–425)

## 2019-09-29 LAB — CARBAMAZEPINE LEVEL, TOTAL: Carbamazepine (Tegretol), S: 14.9 ug/mL (ref 4.0–12.0)

## 2019-09-29 NOTE — Telephone Encounter (Signed)
Patient's mother Joanna Robinson advised as directed below.

## 2019-09-29 NOTE — Telephone Encounter (Signed)
Medication Refill - Medication: Has the HCTZ and Iron dosages changed if so need a new script with correct dose.  Has the patient contacted their pharmacy? Yes.   (Agent: If no, request that the patient contact the pharmacy for the refill.) (Agent: If yes, when and what did the pharmacy advise?)  Preferred Pharmacy (with phone number or street name): [facility] - [place], [place] - [address]  Agent: Please be advised that RX refills may take up to 3 business days. We ask that you follow-up with your pharmacy.

## 2019-09-29 NOTE — Telephone Encounter (Signed)
Patient's mother advised advised as directed below. She wants to know if patient is in danger with her potassium being low. States" I have been increasing the rich foods in her diet already and if provider decides to increase her potassium to please call the pharmacy to let them know to increase the potassium packet"

## 2019-09-29 NOTE — Telephone Encounter (Signed)
It is only very borderline low, only by one tenth of a point. Normal is 3.5 and she is 3.4. Not in a dangerous level.  I dont want to increase the potassium supplement just yet as I am worried it will cause her levels to become too high. Will monitor at next visit

## 2019-09-30 ENCOUNTER — Other Ambulatory Visit: Payer: Self-pay | Admitting: Physician Assistant

## 2019-09-30 DIAGNOSIS — J302 Other seasonal allergic rhinitis: Secondary | ICD-10-CM

## 2019-09-30 DIAGNOSIS — N946 Dysmenorrhea, unspecified: Secondary | ICD-10-CM

## 2019-09-30 MED ORDER — HYDROCHLOROTHIAZIDE 25 MG PO TABS: 12.5000 mg | ORAL_TABLET | Freq: Every day | ORAL | 1 refills | Status: DC

## 2019-09-30 MED ORDER — FERROUS GLUCONATE 324 (38 FE) MG PO TABS: 324.0000 mg | ORAL_TABLET | Freq: Every day | ORAL | 1 refills | Status: DC

## 2019-09-30 NOTE — Telephone Encounter (Signed)
Updated meds sent to [facility]

## 2019-10-11 ENCOUNTER — Other Ambulatory Visit: Payer: Self-pay | Admitting: Physician Assistant

## 2019-10-11 DIAGNOSIS — J0140 Acute pansinusitis, unspecified: Secondary | ICD-10-CM

## 2019-10-11 NOTE — Telephone Encounter (Signed)
Pts mother called stating that the pt is still experiencing neck pain and a sore throat and is requesting to have a refill of the pts Augmentin refilled. Please advise.      [facility] [place], [place] - [address]  [address] [place] [address]  Phone: [phone] Fax: [phone]  Hours: Not open 24 hours

## 2019-10-11 NOTE — Telephone Encounter (Signed)
Attempted to reach guardian to see if there are any other symptoms. Left message. Requesting refill of Augmentin. Please advise pt.'s mother.

## 2019-10-12 MED ORDER — AMOXICILLIN-POT CLAVULANATE 875-125 MG PO TABS: 1.0000 | ORAL_TABLET | Freq: Two times a day (BID) | ORAL | 0 refills | Status: DC

## 2019-10-12 NOTE — Telephone Encounter (Signed)
Ms. Laural Benes advised.   Thanks,   -Vernona Rieger

## 2019-10-12 NOTE — Telephone Encounter (Signed)
Will defer to her PCP, may need to be re-evaluated. Joanna Robinson is checking messages.

## 2019-10-12 NOTE — Telephone Encounter (Signed)
Will do one more round. If not improving still would recommend referral to ENT

## 2019-10-12 NOTE — Addendum Note (Signed)
Addended by: Margaretann Loveless on: 10/12/2019 09:59 AM   Modules accepted: Orders

## 2019-10-24 ENCOUNTER — Other Ambulatory Visit: Payer: Self-pay | Admitting: Physician Assistant

## 2019-10-24 DIAGNOSIS — K5900 Constipation, unspecified: Secondary | ICD-10-CM

## 2019-10-24 DIAGNOSIS — K219 Gastro-esophageal reflux disease without esophagitis: Secondary | ICD-10-CM

## 2019-11-16 ENCOUNTER — Other Ambulatory Visit: Payer: Self-pay | Admitting: Physician Assistant

## 2019-11-16 DIAGNOSIS — L700 Acne vulgaris: Secondary | ICD-10-CM

## 2019-11-22 ENCOUNTER — Other Ambulatory Visit: Payer: Self-pay | Admitting: Physician Assistant

## 2019-11-22 DIAGNOSIS — E876 Hypokalemia: Secondary | ICD-10-CM

## 2019-11-24 ENCOUNTER — Other Ambulatory Visit: Payer: Self-pay | Admitting: Physician Assistant

## 2019-11-24 ENCOUNTER — Other Ambulatory Visit (INDEPENDENT_AMBULATORY_CARE_PROVIDER_SITE_OTHER): Payer: Self-pay | Admitting: Pediatrics

## 2019-11-24 ENCOUNTER — Other Ambulatory Visit (INDEPENDENT_AMBULATORY_CARE_PROVIDER_SITE_OTHER): Payer: Self-pay | Admitting: Family

## 2019-11-24 DIAGNOSIS — G40209 Localization-related (focal) (partial) symptomatic epilepsy and epileptic syndromes with complex partial seizures, not intractable, without status epilepticus: Secondary | ICD-10-CM

## 2019-11-24 DIAGNOSIS — J302 Other seasonal allergic rhinitis: Secondary | ICD-10-CM

## 2019-11-24 DIAGNOSIS — K5900 Constipation, unspecified: Secondary | ICD-10-CM

## 2019-11-24 DIAGNOSIS — K219 Gastro-esophageal reflux disease without esophagitis: Secondary | ICD-10-CM

## 2019-11-24 DIAGNOSIS — R609 Edema, unspecified: Secondary | ICD-10-CM

## 2019-11-24 NOTE — Telephone Encounter (Signed)
Please send to the pharmacy °

## 2019-12-23 ENCOUNTER — Other Ambulatory Visit (INDEPENDENT_AMBULATORY_CARE_PROVIDER_SITE_OTHER): Payer: Self-pay | Admitting: Family

## 2019-12-23 ENCOUNTER — Other Ambulatory Visit: Payer: Self-pay | Admitting: Physician Assistant

## 2019-12-23 DIAGNOSIS — E876 Hypokalemia: Secondary | ICD-10-CM

## 2019-12-23 DIAGNOSIS — G40209 Localization-related (focal) (partial) symptomatic epilepsy and epileptic syndromes with complex partial seizures, not intractable, without status epilepticus: Secondary | ICD-10-CM

## 2019-12-26 ENCOUNTER — Other Ambulatory Visit: Payer: Self-pay | Admitting: Physician Assistant

## 2019-12-26 ENCOUNTER — Telehealth (INDEPENDENT_AMBULATORY_CARE_PROVIDER_SITE_OTHER): Payer: Self-pay | Admitting: Family

## 2019-12-26 ENCOUNTER — Other Ambulatory Visit (INDEPENDENT_AMBULATORY_CARE_PROVIDER_SITE_OTHER): Payer: Self-pay | Admitting: Family

## 2019-12-26 DIAGNOSIS — L2082 Flexural eczema: Secondary | ICD-10-CM

## 2019-12-26 DIAGNOSIS — G40209 Localization-related (focal) (partial) symptomatic epilepsy and epileptic syndromes with complex partial seizures, not intractable, without status epilepticus: Secondary | ICD-10-CM

## 2019-12-26 DIAGNOSIS — J302 Other seasonal allergic rhinitis: Secondary | ICD-10-CM

## 2019-12-26 NOTE — Telephone Encounter (Signed)
Please send to the pharmacy °

## 2019-12-26 NOTE — Telephone Encounter (Signed)
That's fine.  Patient prescription sent through rx request.   Lorenz Coaster MD MPH

## 2019-12-26 NOTE — Telephone Encounter (Signed)
Requested Prescriptions  Pending Prescriptions Disp Refills  . fluvoxaMINE (LUVOX) 100 MG tablet [Pharmacy Med Name: FLUVOXAMINE MALEATE 100 MG TAB] 180 tablet 1    Sig: TAKE 1 TABLET BY MOUTH TWICE DAILY     Psychiatry:  Antidepressants - SSRI Passed - 12/26/2019 11:57 AM      Passed - Valid encounter within last 6 months    Recent Outpatient Visits          2 months ago High risk medication use   [facility], [place], [place]   5 months ago Constipation, unspecified constipation type   [facility], Marzella Schlein, [place]   7 months ago Iron deficiency anemia secondary to inadequate dietary iron intake   [facility] [name] M, [place]   10 months ago Need for influenza vaccination   [facility], Alessandra Bevels, [place]   1 year ago Pre-op examination   [facility] [name] M, PA-C             . fexofenadine (ALLEGRA) 180 MG tablet [Pharmacy Med Name: FEXOFENADINE HCL 180 MG TAB] 90 tablet 1    Sig: TAKE 1 TABLET BY MOUTH ONCE DAILY     Ear, Nose, and Throat:  Antihistamines Passed - 12/26/2019 11:57 AM      Passed - Valid encounter within last 12 months    Recent Outpatient Visits          2 months ago High risk medication use   [facility] [place], [place], [place]   5 months ago Constipation, unspecified constipation type   [facility], Marzella Schlein, [place]   7 months ago Iron deficiency anemia secondary to inadequate dietary iron intake   [facility] [name] M, [place]   10 months ago Need for influenza vaccination   [facility] [place], Alessandra Bevels, [place]   1 year ago Pre-op examination   [facility] [place], [place], [place]

## 2019-12-26 NOTE — Telephone Encounter (Signed)
Requested Prescriptions  Pending Prescriptions Disp Refills  . triamcinolone cream (KENALOG) 0.1 % [Pharmacy Med Name: TRIAMCINOLONE ACETON 0.1% TOP CREAM] 30 g 0    Sig: APPLY TO AFFECTED AREA(s) TWICE DAILY ASDIRECTED     Dermatology:  Corticosteroids Passed - 12/26/2019  4:36 PM      Passed - Valid encounter within last 12 months    Recent Outpatient Visits          2 months ago High risk medication use   [facility] [place], [place], [place]   5 months ago Constipation, unspecified constipation type   [facility], Marzella Schlein, [place]   7 months ago Iron deficiency anemia secondary to inadequate dietary iron intake   [facility] [name] M, [place]   10 months ago Need for influenza vaccination   [facility] [place], Alessandra Bevels, [place]   1 year ago Pre-op examination   [facility] [place], [place], [place]

## 2019-12-26 NOTE — Telephone Encounter (Signed)
  Who's calling (name and relationship to patient) : [name] (mom)  Best contact number: [phone]  Provider they see: Elveria Rising  Reason for call: Mom called because patient is almost out of medications and pharmacy states that prescriptions are expired. Mom scheduled an appointment for patient to see Elveria Rising on 10/12 but wants to know if prescriptions can be sent in before that date. When asked which medications all mom would say is "all of the ones from your office."    PRESCRIPTION REFILL ONLY  Name of prescription:  Pharmacy: [facility] - [place], [place] - [address]

## 2019-12-27 ENCOUNTER — Other Ambulatory Visit: Payer: Self-pay | Admitting: Physician Assistant

## 2019-12-27 DIAGNOSIS — H10401 Unspecified chronic conjunctivitis, right eye: Secondary | ICD-10-CM

## 2019-12-27 MED ORDER — CARBATROL 200 MG PO CP12: ORAL_CAPSULE | ORAL | 0 refills | Status: DC

## 2019-12-27 MED ORDER — CARBATROL 300 MG PO CP12: 300.0000 mg | ORAL_CAPSULE | Freq: Every day | ORAL | 0 refills | Status: DC

## 2019-12-27 NOTE — Telephone Encounter (Signed)
Requested medications are due for refill today?  Yes   Requested medications are on active medication list?  Yes  Last Refill:   05/06/2019  # 15 with 5 refills   Future visit scheduled?  No   Notes to Clinic:  Ophthalmic medication not assigned to a protocol.  Please review.    The Azelastine - Fluticasone 137-50 one spray each nostril twice a day was refilled 3 days ago.

## 2019-12-30 ENCOUNTER — Other Ambulatory Visit: Payer: Self-pay

## 2019-12-30 ENCOUNTER — Ambulatory Visit (INDEPENDENT_AMBULATORY_CARE_PROVIDER_SITE_OTHER): Payer: Medicare Other | Admitting: Physician Assistant

## 2019-12-30 ENCOUNTER — Encounter: Payer: Self-pay | Admitting: Physician Assistant

## 2019-12-30 VITALS — BP 100/66 | HR 71 | Temp 97.7°F | Ht 68.0 in | Wt 195.0 lb

## 2019-12-30 DIAGNOSIS — E7800 Pure hypercholesterolemia, unspecified: Secondary | ICD-10-CM

## 2019-12-30 DIAGNOSIS — M542 Cervicalgia: Secondary | ICD-10-CM | POA: Diagnosis not present

## 2019-12-30 DIAGNOSIS — G40909 Epilepsy, unspecified, not intractable, without status epilepticus: Secondary | ICD-10-CM | POA: Diagnosis not present

## 2019-12-30 DIAGNOSIS — F840 Autistic disorder: Secondary | ICD-10-CM | POA: Diagnosis not present

## 2019-12-30 DIAGNOSIS — H1013 Acute atopic conjunctivitis, bilateral: Secondary | ICD-10-CM

## 2019-12-30 DIAGNOSIS — Z23 Encounter for immunization: Secondary | ICD-10-CM

## 2019-12-30 DIAGNOSIS — Z79899 Other long term (current) drug therapy: Secondary | ICD-10-CM

## 2019-12-30 DIAGNOSIS — R7889 Finding of other specified substances, not normally found in blood: Secondary | ICD-10-CM | POA: Diagnosis not present

## 2019-12-30 DIAGNOSIS — D702 Other drug-induced agranulocytosis: Secondary | ICD-10-CM | POA: Diagnosis not present

## 2019-12-30 DIAGNOSIS — Z0000 Encounter for general adult medical examination without abnormal findings: Secondary | ICD-10-CM

## 2019-12-30 MED ORDER — AZELASTINE HCL 0.05 % OP SOLN: 1.0000 [drp] | Freq: Two times a day (BID) | OPHTHALMIC | 12 refills | Status: DC

## 2019-12-30 NOTE — Patient Instructions (Signed)
Health Maintenance, Female Adopting a healthy lifestyle and getting preventive care are important in promoting health and wellness. Ask your health care provider about:  The right schedule for you to have regular tests and exams.  Things you can do on your own to prevent diseases and keep yourself healthy. What should I know about diet, weight, and exercise? Eat a healthy diet   Eat a diet that includes plenty of vegetables, fruits, low-fat dairy products, and lean protein.  Do not eat a lot of foods that are high in solid fats, added sugars, or sodium. Maintain a healthy weight Body mass index (BMI) is used to identify weight problems. It estimates body fat based on height and weight. Your health care provider can help determine your BMI and help you achieve or maintain a healthy weight. Get regular exercise Get regular exercise. This is one of the most important things you can do for your health. Most adults should:  Exercise for at least 150 minutes each week. The exercise should increase your heart rate and make you sweat (moderate-intensity exercise).  Do strengthening exercises at least twice a week. This is in addition to the moderate-intensity exercise.  Spend less time sitting. Even light physical activity can be beneficial. Watch cholesterol and blood lipids Have your blood tested for lipids and cholesterol at 37 years of age, then have this test every 5 years. Have your cholesterol levels checked more often if:  Your lipid or cholesterol levels are high.  You are older than 37 years of age.  You are at high risk for heart disease. What should I know about cancer screening? Depending on your health history and family history, you may need to have cancer screening at various ages. This may include screening for:  Breast cancer.  Cervical cancer.  Colorectal cancer.  Skin cancer.  Lung cancer. What should I know about heart disease, diabetes, and high blood  pressure? Blood pressure and heart disease  High blood pressure causes heart disease and increases the risk of stroke. This is more likely to develop in people who have high blood pressure readings, are of African descent, or are overweight.  Have your blood pressure checked: ? Every 3-5 years if you are 18-39 years of age. ? Every year if you are 40 years old or older. Diabetes Have regular diabetes screenings. This checks your fasting blood sugar level. Have the screening done:  Once every three years after age 40 if you are at a normal weight and have a low risk for diabetes.  More often and at a younger age if you are overweight or have a high risk for diabetes. What should I know about preventing infection? Hepatitis B If you have a higher risk for hepatitis B, you should be screened for this virus. Talk with your health care provider to find out if you are at risk for hepatitis B infection. Hepatitis C Testing is recommended for:  Everyone born from 1945 through 1965.  Anyone with known risk factors for hepatitis C. Sexually transmitted infections (STIs)  Get screened for STIs, including gonorrhea and chlamydia, if: ? You are sexually active and are younger than 37 years of age. ? You are older than 37 years of age and your health care provider tells you that you are at risk for this type of infection. ? Your sexual activity has changed since you were last screened, and you are at increased risk for chlamydia or gonorrhea. Ask your health care provider if   you are at risk.  Ask your health care provider about whether you are at high risk for HIV. Your health care provider may recommend a prescription medicine to help prevent HIV infection. If you choose to take medicine to prevent HIV, you should first get tested for HIV. You should then be tested every 3 months for as long as you are taking the medicine. Pregnancy  If you are about to stop having your period (premenopausal) and  you may become pregnant, seek counseling before you get pregnant.  Take 400 to 800 micrograms (mcg) of folic acid every day if you become pregnant.  Ask for birth control (contraception) if you want to prevent pregnancy. Osteoporosis and menopause Osteoporosis is a disease in which the bones lose minerals and strength with aging. This can result in bone fractures. If you are 65 years old or older, or if you are at risk for osteoporosis and fractures, ask your health care provider if you should:  Be screened for bone loss.  Take a calcium or vitamin D supplement to lower your risk of fractures.  Be given hormone replacement therapy (HRT) to treat symptoms of menopause. Follow these instructions at home: Lifestyle  Do not use any products that contain nicotine or tobacco, such as cigarettes, e-cigarettes, and chewing tobacco. If you need help quitting, ask your health care provider.  Do not use street drugs.  Do not share needles.  Ask your health care provider for help if you need support or information about quitting drugs. Alcohol use  Do not drink alcohol if: ? Your health care provider tells you not to drink. ? You are pregnant, may be pregnant, or are planning to become pregnant.  If you drink alcohol: ? Limit how much you use to 0-1 drink a day. ? Limit intake if you are breastfeeding.  Be aware of how much alcohol is in your drink. In the [place] one drink equals one 12 oz bottle of beer (355 mL), one 5 oz glass of wine (148 mL), or one 1 oz glass of hard liquor (44 mL). General instructions  Schedule regular health, dental, and eye exams.  Stay current with your vaccines.  Tell your health care provider if: ? You often feel depressed. ? You have ever been abused or do not feel safe at home. Summary  Adopting a healthy lifestyle and getting preventive care are important in promoting health and wellness.  Follow your health care provider's instructions about healthy  diet, exercising, and getting tested or screened for diseases.  Follow your health care provider's instructions on monitoring your cholesterol and blood pressure. This information is not intended to replace advice given to you by your health care provider. Make sure you discuss any questions you have with your health care provider. Document Revised: 03/03/2018 Document Reviewed: 03/03/2018 Elsevier Patient Education  2020 Elsevier Inc.  

## 2019-12-30 NOTE — Progress Notes (Signed)
Annual Wellness Visit     Patient: Joanna Robinson, Female    DOB: [birth year], 38 y.o.   MRN: [id number] Visit Date: 12/30/2019  Today's Provider: Margaretann Loveless, PA-C   Chief Complaint  Patient presents with  . Annual Exam   Subjective    Joanna Robinson is a 39 y.o. female who presents today for her Annual Wellness Visit. She reports consuming a general diet. Exercises some. She generally feels well. She reports sleeping fairly well. She does have additional problems to discuss today.   HPI  Pt is having trouble with a sore throat/neck pain.  Comes and goes. Patient does have tics that are worsening. One of them is that she throws her head and neck way back with face looking toward the sky.   Patient Active Problem List   Diagnosis Date Noted  . Dark stools 07/13/2019  . Abnormal blood chemistry 12/20/2014  . Abscess of axilla 12/20/2014  . Dry eye syndrome 12/20/2014  . Dermoid inclusion cyst 12/20/2014  . Hematoma 12/20/2014  . HLD (hyperlipidemia) 12/20/2014  . Decreased potassium in the blood 12/20/2014  . Irregular bleeding 12/20/2014  . Leukopenia 12/20/2014  . Malaise and fatigue 12/20/2014  . Headache 12/18/2014  . Acne 10/30/2014  . Constipation 09/18/2014  . Neutropenia (HCC) 09/18/2014  . Elevated carbamazepine level 01/25/2014  . Active autistic disorder 01/10/2013  . Long-term use of high-risk medication 01/10/2013  . Dysmenorrhea 07/10/2008  . Mechanical and motor problems with internal organs 11/08/2007  . Allergic rhinitis 08/20/2007  . External hemorrhoids without complication 11/13/2006  . Edema 12/26/2003  . Epilepsy, not refractory (HCC) 12/26/2003  . Intellectual disability 12/26/2003   Past Medical History:  Diagnosis Date  . Autism   . Epilepsy (HCC)   . Hemorrhoid   . Hyperlipidemia   . Seizures (HCC)    Social History   Tobacco Use  . Smoking status: Never Smoker  . Smokeless tobacco: Never Used  Vaping Use  .  Vaping Use: Never used  Substance Use Topics  . Alcohol use: No  . Drug use: No   Allergies  Allergen Reactions  . Codeine   . Corticosteroids Nausea Only     Medications: Outpatient Medications Prior to Visit  Medication Sig  . acetaZOLAMIDE (DIAMOX) 250 MG tablet TAKE 1 TABLET BY MOUTH TWICE DAILY  . Alpha-D-Galactosidase (BEANO) TABS Take by mouth as needed (2-3 Tabs as needed prior to meal that may cause bloating.).  Marland Kitchen Aluminum & Magnesium Hydroxide (MYLANTA ULTIMATE STRENGTH) 500-500 MG/5ML SUSP MYLANTA ULTIMATE STRENGTH, 500-500MG /5ML (Oral Suspension)  1 TID as needed for 0 days  Quantity: 0.00;  Refills: 0   Ordered :14-Mar-2010  Denna Haggard, MA, [place] ;  Started 19-October-2008 Active Comments: Medication taken as needed.   Marland Kitchen amoxicillin-clavulanate (AUGMENTIN) 875-125 MG tablet Take 1 tablet by mouth 2 (two) times daily.  . Azelastine-Fluticasone 137-50 MCG/ACT SUSP USE 1 SPRAY INTO EACH NOSTRIL TWICE DAILY  . benzoyl peroxide-erythromycin (BENZAMYCIN) gel APPLY TO AFFECTED AREA(s) TWICE DAILY ASDIRECTED  . brompheniramine-pseudoephedrine-DM 30-2-10 MG/5ML syrup Take 5 mLs by mouth 4 (four) times daily as needed.  . CARBATROL 200 MG 12 hr capsule TAKE 2 CAPSULES BY MOUTH ONCE EVERY MORNING AND 1 CAPSULE EVERY EVENING  . CARBATROL 300 MG 12 hr capsule Take 1 capsule (300 mg total) by mouth at bedtime.  . cloNIDine (CATAPRES) 0.2 MG tablet TAKE 1 TABLET BY MOUTH AT BEDTIME  . Dextromethorphan-Menthol (DELSYM COUGH RELIEF MT) Use as directed  in the mouth or throat as needed (2 Tsp. by mouth every 12 hours as needed for cough.). Reported on 04/07/2015  . ferrous gluconate (FERGON) 324 MG tablet Take 1 tablet (324 mg total) by mouth daily with breakfast.  . fexofenadine (ALLEGRA) 180 MG tablet TAKE 1 TABLET BY MOUTH ONCE DAILY  . fluvoxaMINE (LUVOX) 100 MG tablet TAKE 1 TABLET BY MOUTH TWICE DAILY  . furosemide (LASIX) 20 MG tablet Take 1 tablet by mouth daily.  .  hydrochlorothiazide (HYDRODIURIL) 25 MG tablet TAKE 1 TABLET BY MOUTH ONCE DAILY  . hydrocortisone (ANUSOL-HC) 2.5 % rectal cream Place 1 application rectally 2 (two) times daily.  Marland Kitchen LINZESS 145 MCG CAPS capsule TAKE 1 CAPSULE BY MOUTH ONCE DAILY  . MAPAP 500 MG tablet TAKE 2 TABLETS BY MOUTH 3 TIMES DAILY ASNEEDED HEADACHE  . montelukast (SINGULAIR) 10 MG tablet TAKE 1 TABLET BY MOUTH ONCE DAILY  . naproxen (NAPROSYN) 500 MG tablet TAKE 1 TABLET BY MOUTH TWICE DAILY  . omega-3 acid ethyl esters (LOVAZA) 1 g capsule Take 1 capsule (1 g total) by mouth daily.  Marland Kitchen omeprazole (PRILOSEC) 40 MG capsule TAKE 1 CAPSULE BY MOUTH ONCE DAILY  . polyethylene glycol powder (GLYCOLAX/MIRALAX) 17 GM/SCOOP powder MIX 17 GRAMS IN 4 TO 8 OZ OF FLUID AND DRINK AT BEDTIME  . potassium chloride (KLOR-CON) 20 MEQ packet DISSOLVE 1 PACKET IN 4 OUNCES OF LIQUID AND DRINK ONCE DAILY.  . sodium fluoride (DENTA 5000 PLUS) 1.1 % CREA dental cream Place 1 application onto teeth every evening. Brush teeth daily for 1 minute, rinse and spit.  Marland Kitchen sulfacetamide (BLEPH-10) 10 % ophthalmic solution PLACE 2 DROPS INTO RIGHT EYE 4 TIMES DAILY  . Sunscreens (AVEENO CLEAR COMPLEXION BB) CREA Apply as directed bid  . triamcinolone cream (KENALOG) 0.1 % APPLY TO AFFECTED AREA(s) TWICE DAILY ASDIRECTED  . Witch Hazel (TUCKS) 50 % PADS Apply 50 % topically as needed (As needed for cleansing and hemorrhoid discomfort.).   No facility-administered medications prior to visit.    Allergies  Allergen Reactions  . Codeine   . Corticosteroids Nausea Only    Patient Care Team: Margaretann Loveless, PA-C as PCP - General (Family Medicine) Kieth Brightly, MD (General Surgery) Lorie Phenix, MD as Referring Physician (Family Medicine)  Review of Systems  Constitutional: Positive for diaphoresis and unexpected weight change.  HENT: Positive for congestion, mouth sores (chapped lips), postnasal drip, rhinorrhea, sinus pressure and  sore throat.   Eyes: Positive for discharge, redness and itching.  Respiratory: Positive for cough. Negative for chest tightness, shortness of breath and wheezing.   Cardiovascular: Negative.   Gastrointestinal: Positive for abdominal pain. Negative for abdominal distention, blood in stool, constipation, diarrhea and nausea.  Endocrine: Negative.   Genitourinary: Negative.   Musculoskeletal: Positive for neck pain and neck stiffness. Negative for arthralgias, back pain, gait problem and myalgias.  Skin: Negative.   Allergic/Immunologic: Positive for environmental allergies.  Neurological: Positive for seizures and headaches. Negative for dizziness, weakness and numbness.  Hematological: Negative.   Psychiatric/Behavioral: Negative.     Last CBC Lab Results  Component Value Date   WBC 3.4 12/30/2019   HGB 13.2 12/30/2019   HCT 40.2 12/30/2019   MCV 96 12/30/2019   MCH 31.5 12/30/2019   RDW 11.6 (L) 12/30/2019   PLT 177 12/30/2019   Last metabolic panel Lab Results  Component Value Date   GLUCOSE 86 12/30/2019   NA 137 12/30/2019   K 3.6 12/30/2019  CL 104 12/30/2019   CO2 20 12/30/2019   BUN 11 12/30/2019   CREATININE 0.69 12/30/2019   GFRNONAA 112 12/30/2019   GFRAA 129 12/30/2019   CALCIUM 9.4 12/30/2019   PROT 6.4 12/30/2019   ALBUMIN 4.0 12/30/2019   LABGLOB 2.4 12/30/2019   AGRATIO 1.7 12/30/2019   BILITOT <0.2 12/30/2019   ALKPHOS 103 12/30/2019   AST 14 12/30/2019   ALT 11 12/30/2019      Objective    Vitals: BP 100/66 (BP Location: Left Arm, Patient Position: Sitting, Cuff Size: Large)   Pulse 71   Temp 97.7 F (36.5 C) (Oral)   Ht 5\' 8"  (1.727 m)   Wt 195 lb (88.5 kg)   BMI 29.65 kg/m  BP Readings from Last 3 Encounters:  12/30/19 100/66  09/28/19 104/62  07/13/19 122/68   Wt Readings from Last 3 Encounters:  01/03/20 180 lb (81.6 kg)  12/30/19 195 lb (88.5 kg)  09/28/19 194 lb (88 kg)      Physical Exam Vitals reviewed.    Constitutional:      General: She is not in acute distress.    Appearance: Normal appearance. She is well-developed, well-groomed and overweight. She is not ill-appearing or diaphoretic.  HENT:     Head: Normocephalic and atraumatic.     Right Ear: Tympanic membrane, ear canal and external ear normal.     Left Ear: Tympanic membrane, ear canal and external ear normal.     Nose: Nose normal.     Mouth/Throat:     Mouth: Mucous membranes are moist.     Pharynx: No oropharyngeal exudate or posterior oropharyngeal erythema.  Eyes:     General: No scleral icterus.       Right eye: No discharge.        Left eye: No discharge.     Extraocular Movements: Extraocular movements intact.     Conjunctiva/sclera: Conjunctivae normal.     Pupils: Pupils are equal, round, and reactive to light.  Neck:     Thyroid: No thyromegaly.     Vascular: No JVD.     Trachea: No tracheal deviation.  Cardiovascular:     Rate and Rhythm: Normal rate and regular rhythm.     Pulses: Normal pulses.     Heart sounds: Normal heart sounds. No murmur heard.  No friction rub. No gallop.   Pulmonary:     Effort: Pulmonary effort is normal. No respiratory distress.     Breath sounds: Normal breath sounds. No wheezing or rales.  Chest:     Chest wall: No tenderness.  Abdominal:     General: Abdomen is flat. Bowel sounds are normal. There is no distension.     Palpations: Abdomen is soft. There is no mass.     Tenderness: There is no abdominal tenderness. There is no guarding or rebound.  Musculoskeletal:        General: No tenderness. Normal range of motion.     Cervical back: Normal range of motion and neck supple. No tenderness.     Right lower leg: No edema.     Left lower leg: No edema.  Lymphadenopathy:     Cervical: No cervical adenopathy.  Skin:    General: Skin is warm and dry.     Capillary Refill: Capillary refill takes less than 2 seconds.     Findings: No rash.  Neurological:     General: No  focal deficit present.     Mental Status: She is alert.  Mental status is at baseline.  Psychiatric:        Mood and Affect: Mood normal.        Behavior: Behavior normal. Behavior is cooperative.        Thought Content: Thought content normal.        Judgment: Judgment normal.     Most recent functional status assessment: No flowsheet data found. Most recent fall risk assessment: Fall Risk  12/17/2018  Falls in the past year? 0  Number falls in past yr: 0  Injury with Fall? 0    Most recent depression screenings: PHQ 2/9 Scores 12/17/2018 09/29/2018  PHQ - 2 Score 0 0   Most recent cognitive screening: No flowsheet data found. Most recent Audit-C alcohol use screening Alcohol Use Disorder Test (AUDIT) 09/29/2018  1. How often do you have a drink containing alcohol? 0  2. How many drinks containing alcohol do you have on a typical day when you are drinking? 0  3. How often do you have six or more drinks on one occasion? 0  AUDIT-C Score 0  Alcohol Brief Interventions/Follow-up AUDIT Score <7 follow-up not indicated   A score of 3 or more in women, and 4 or more in men indicates increased risk for alcohol abuse, EXCEPT if all of the points are from question 1   No results found for any visits on 12/30/19.  Assessment & Plan     Annual wellness visit done today including the all of the following: Reviewed patient's Family Medical History Reviewed and updated list of patient's medical providers Assessment of cognitive impairment was done Assessed patient's functional ability Established a written schedule for health screening services Health Risk Assessent Completed and Reviewed  Exercise Activities and Dietary recommendations Goals   None     Immunization History  Administered Date(s) Administered  . Influenza,inj,Quad PF,6+ Mos 01/07/2013, 01/05/2018, 02/07/2019  . PFIZER SARS-COV-2 Vaccination 08/16/2019, 09/06/2019  . Td 12/20/2004  . Tdap 08/16/2010    Health  Maintenance  Topic Date Due  . Hepatitis C Screening  Never done  . INFLUENZA VACCINE  10/23/2019  . PAP SMEAR-Modifier  09/29/2023 (Originally 10/16/2003)  . TETANUS/TDAP  08/15/2020  . COVID-19 Vaccine  Completed  . HIV Screening  Discontinued     Discussed health benefits of physical activity, and encouraged her to engage in regular exercise appropriate for her age and condition.    1. Annual physical exam Normal physical exam today. Will check labs as below and f/u pending lab results. If labs are stable and WNL she will not need to have these rechecked for one year at her next annual physical exam. She is to call the office in the meantime if she has any acute issue, questions or concerns. - CBC w/Diff/Platelet - Comprehensive Metabolic Panel (CMET) - TSH - Lipid Panel With LDL/HDL Ratio - HgB A1c  2. Allergic conjunctivitis of both eyes Will start Optivar as below for itching, red and watery eyes. Continue her other drops as well she has for dry eye.  - azelastine (OPTIVAR) 0.05 % ophthalmic solution; Place 1 drop into both eyes 2 (two) times daily.  Dispense: 6 mL; Refill: 12  3. Epilepsy, not refractory (HCC) Stable. Followed by Neurology. Will check labs as below and f/u pending results. - CBC w/Diff/Platelet - Comprehensive Metabolic Panel (CMET) - Carbamazepine, Free and Total  4. Active autistic disorder Stable. Will check labs as below and f/u pending results. - CBC w/Diff/Platelet  5. Elevated carbamazepine level On  tegretol for seizures. Followed by Neurology. Will check labs as below and f/u pending results. Will forward labs to Neurologist.  - CBC w/Diff/Platelet - Carbamazepine, Free and Total  6. Pure hypercholesterolemia Stable. Diet controlled. Will check labs as below and f/u pending results. - CBC w/Diff/Platelet - Comprehensive Metabolic Panel (CMET) - Lipid Panel With LDL/HDL Ratio - HgB A1c  7. Long-term use of high-risk medication Stable. Will  check labs as below and f/u pending results. - CBC w/Diff/Platelet - Comprehensive Metabolic Panel (CMET) - TSH - Lipid Panel With LDL/HDL Ratio - HgB A1c  8. Other drug-induced neutropenia (HCC) H/O this. Will check labs as below and f/u pending results. - CBC w/Diff/Platelet  9. Need for influenza vaccination Flu vaccine given today without complication. Patient sat upright for 15 minutes to check for adverse reaction before being released. - Flu Vaccine QUAD 36+ mos IM  10. Neck pain Suspected due to worsening tics with throwing her neck back. Discussed heating pad, biofreeze. Can use ibuprofen or aleve prn.    No follow-ups on file.     Delmer Islam, PA-C, have reviewed all documentation for this visit. The documentation on 01/03/20 for the exam, diagnosis, procedures, and orders are all accurate and complete.   Reine Just  [facility] [phone] (phone) [phone] (fax)  [facility]

## 2020-01-02 LAB — COMPREHENSIVE METABOLIC PANEL
ALT: 11 IU/L (ref 0–32)
AST: 14 IU/L (ref 0–40)
Albumin/Globulin Ratio: 1.7 (ref 1.2–2.2)
Albumin: 4 g/dL (ref 3.8–4.8)
Alkaline Phosphatase: 103 IU/L (ref 44–121)
BUN/Creatinine Ratio: 16 (ref 9–23)
BUN: 11 mg/dL (ref 6–20)
Bilirubin Total: <0.2 mg/dL (ref 0.0–1.2)
CO2: 20 mmol/L (ref 20–29)
Calcium: 9.4 mg/dL (ref 8.7–10.2)
Chloride: 104 mmol/L (ref 96–106)
Creatinine, Ser: 0.69 mg/dL (ref 0.57–1.00)
GFR calc Af Amer: 129 mL/min/{1.73_m2} (ref >59)
GFR calc non Af Amer: 112 mL/min/{1.73_m2} (ref >59)
Globulin, Total: 2.4 g/dL (ref 1.5–4.5)
Glucose: 86 mg/dL (ref 65–99)
Potassium: 3.6 mmol/L (ref 3.5–5.2)
Sodium: 137 mmol/L (ref 134–144)
Total Protein: 6.4 g/dL (ref 6.0–8.5)

## 2020-01-02 LAB — CBC WITH DIFFERENTIAL/PLATELET
Basophils Absolute: 0 10*3/uL (ref 0.0–0.2)
Basos: 0 %
EOS (ABSOLUTE): 0 10*3/uL (ref 0.0–0.4)
Eos: 1 %
Hematocrit: 40.2 % (ref 34.0–46.6)
Hemoglobin: 13.2 g/dL (ref 11.1–15.9)
Immature Grans (Abs): 0 10*3/uL (ref 0.0–0.1)
Immature Granulocytes: 0 %
Lymphocytes Absolute: 1 10*3/uL (ref 0.7–3.1)
Lymphs: 29 %
MCH: 31.5 pg (ref 26.6–33.0)
MCHC: 32.8 g/dL (ref 31.5–35.7)
MCV: 96 fL (ref 79–97)
Monocytes Absolute: 0.3 10*3/uL (ref 0.1–0.9)
Monocytes: 9 %
Neutrophils Absolute: 2 10*3/uL (ref 1.4–7.0)
Neutrophils: 61 %
Platelets: 177 10*3/uL (ref 150–450)
RBC: 4.19 x10E6/uL (ref 3.77–5.28)
RDW: 11.6 % — ABNORMAL LOW (ref 11.7–15.4)
WBC: 3.4 10*3/uL (ref 3.4–10.8)

## 2020-01-02 LAB — LIPID PANEL WITH LDL/HDL RATIO
Cholesterol, Total: 217 mg/dL — ABNORMAL HIGH (ref 100–199)
HDL: 74 mg/dL (ref >39)
LDL Chol Calc (NIH): 135 mg/dL — ABNORMAL HIGH (ref 0–99)
LDL/HDL Ratio: 1.8 ratio (ref 0.0–3.2)
Triglycerides: 44 mg/dL (ref 0–149)
VLDL Cholesterol Cal: 8 mg/dL (ref 5–40)

## 2020-01-02 LAB — HEMOGLOBIN A1C
Est. average glucose Bld gHb Est-mCnc: 103 mg/dL
Hgb A1c MFr Bld: 5.2 % (ref 4.8–5.6)

## 2020-01-02 LAB — CARBAMAZEPINE, FREE AND TOTAL
Carbamazepine, Free: 3.3 ug/mL (ref 0.6–4.2)
Carbamazepine, Total: 14.8 ug/mL (ref 4.0–12.0)

## 2020-01-02 LAB — TSH: TSH: 1.99 u[IU]/mL (ref 0.450–4.500)

## 2020-01-03 ENCOUNTER — Telehealth (INDEPENDENT_AMBULATORY_CARE_PROVIDER_SITE_OTHER): Payer: Medicare Other | Admitting: Family

## 2020-01-03 ENCOUNTER — Encounter (INDEPENDENT_AMBULATORY_CARE_PROVIDER_SITE_OTHER): Payer: Self-pay | Admitting: Family

## 2020-01-03 VITALS — Ht 68.0 in | Wt 180.0 lb

## 2020-01-03 DIAGNOSIS — F840 Autistic disorder: Secondary | ICD-10-CM | POA: Diagnosis not present

## 2020-01-03 DIAGNOSIS — G40209 Localization-related (focal) (partial) symptomatic epilepsy and epileptic syndromes with complex partial seizures, not intractable, without status epilepticus: Secondary | ICD-10-CM | POA: Diagnosis not present

## 2020-01-03 DIAGNOSIS — F79 Unspecified intellectual disabilities: Secondary | ICD-10-CM | POA: Diagnosis not present

## 2020-01-03 MED ORDER — CARBATROL 200 MG PO CP12: ORAL_CAPSULE | ORAL | 5 refills | Status: DC

## 2020-01-03 MED ORDER — ACETAZOLAMIDE 250 MG PO TABS: 250.0000 mg | ORAL_TABLET | Freq: Two times a day (BID) | ORAL | 5 refills | Status: DC

## 2020-01-03 MED ORDER — CARBATROL 300 MG PO CP12: 300.0000 mg | ORAL_CAPSULE | Freq: Every day | ORAL | 5 refills | Status: DC

## 2020-01-03 NOTE — Progress Notes (Signed)
This is a Pediatric Specialist E-Visit follow up consult provided via  MyChart Joanna Robinson, mother of Joanna Robinson consented to an E-Visit consult today.  Location of patient: home. Her mother Joanna Robinson is at work Location of provider: Elveria Rising, NP is at Office (location) Patient was referred by Suella Grove*   The following participants were involved in this E-Visit: Lorre Munroe, CMA              Elveria Rising, NP Total time on call: 15 min Follow up: 1 year  Patient: Joanna Robinson MRN: [id number] Sex: female DOB: January 23, 1981  Provider: Elveria Rising NP-C Location of Care: [facility]  Visit type: Routine Follow-Up  Last visit: 12/22/2018  Referral source: Lorie Phenix, MD  History from: Epic chart and patient's mother  Brief history:  Copied from previous record: History of undifferentiated autism and complex partial seizures  Today's concerns: Mom reports today that Joanna Robinson continues to have brief seizures with her menstrual cycle but has otherwise remained seizure free. She says that Joanna Robinson is usually in a good mood but can get irritable during her cycle as well. Mom is concerned about behaviors that she calls tics in which Joanna Robinson sharply flings her head back and may hold it in that position for several minutes. Later in the day, Joanna Robinson sometimes goes to her mother and says "hurt" and points to the back of her head. Mom feels that this behavior also occurs more frequently during the menstrual cycles. Mom is concerned that Joanna Robinson will injure her neck or spinal cord with these behaviors.   Joanna Robinson is cared for at home by caregivers while her mother works. Mom says that Joanna Robinson continues to be very self directed and needs constant supervision and monitoring.   Mom notes that Joanna Robinson has been otherwise generally healthy since she was last seen. She received the Covid-19 vaccines and did well with those. Mom  has no other health concerns for Joanna Robinson other than previously mentioned.   Review of systems: Please see HPI for neurologic and other pertinent review of systems. Otherwise all other systems were reviewed and were negative.  Problem List: Patient Active Problem List   Diagnosis Date Noted  . Dark stools 07/13/2019  . Abnormal blood chemistry 12/20/2014  . Abscess of axilla 12/20/2014  . Dry eye syndrome 12/20/2014  . Dermoid inclusion cyst 12/20/2014  . Hematoma 12/20/2014  . HLD (hyperlipidemia) 12/20/2014  . Decreased potassium in the blood 12/20/2014  . Irregular bleeding 12/20/2014  . Leukopenia 12/20/2014  . Malaise and fatigue 12/20/2014  . Headache 12/18/2014  . Acne 10/30/2014  . Constipation 09/18/2014  . Neutropenia (HCC) 09/18/2014  . Elevated carbamazepine level 01/25/2014  . Active autistic disorder 01/10/2013  . Long-term use of high-risk medication 01/10/2013  . Dysmenorrhea 07/10/2008  . Mechanical and motor problems with internal organs 11/08/2007  . Allergic rhinitis 08/20/2007  . External hemorrhoids without complication 11/13/2006  . Edema 12/26/2003  . Epilepsy, not refractory (HCC) 12/26/2003  . Intellectual disability 12/26/2003     Past Medical History:  Diagnosis Date  . Autism   . Epilepsy (HCC)   . Hemorrhoid   . Hyperlipidemia   . Seizures (HCC)     Past medical history comments: See HPI  Surgical history: Past Surgical History:  Procedure Laterality Date  . NO PAST SURGERIES      Family history: family history includes Alzheimer's disease in her maternal grandmother and paternal grandfather.   Social history:  Social History   Socioeconomic History  . Marital status: Single    Spouse name: Not on file  . Number of children: Not on file  . Years of education: Not on file  . Highest education level: Not on file  Occupational History  . Not on file  Tobacco Use  . Smoking status: Never Smoker  . Smokeless tobacco: Never Used   Vaping Use  . Vaping Use: Never used  Substance and Sexual Activity  . Alcohol use: No  . Drug use: No  . Sexual activity: Never  Other Topics Concern  . 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 She has a nurse that comes to her home and they go out on field trips. She enjoys exercise, dance, and riding bike. She lives with her mother.   Social Determinants of Health   Financial Resource Strain:   . Difficulty of Paying Living Expenses: Not on file  Food Insecurity:   . Worried About Programme researcher, broadcasting/film/videounning Out of Food in the Last Year: Not on file  . Ran Out of Food in the Last Year: Not on file  Transportation Needs:   . Lack of Transportation (Medical): Not on file  . Lack of Transportation (Non-Medical): Not on file  Physical Activity:   . Days of Exercise per Week: Not on file  . Minutes of Exercise per Session: Not on file  Stress:   . Feeling of Stress : Not on file  Social Connections:   . Frequency of Communication with Friends and Family: Not on file  . Frequency of Social Gatherings with Friends and Family: Not on file  . Attends Religious Services: Not on file  . Active Member of Clubs or Organizations: Not on file  . Attends BankerClub or Organization Meetings: Not on file  . Marital Status: Not on file  Intimate Partner Violence:   . Fear of Current or Ex-Partner: Not on file  . Emotionally Abused: Not on file  . Physically Abused: Not on file  . Sexually Abused: Not on file     Past/failed meds:  Allergies: Allergies  Allergen Reactions  . Codeine   . Corticosteroids Nausea Only    Immunizations: Immunization History  Administered Date(s) Administered  . Influenza,inj,Quad PF,6+ Mos 01/07/2013, 01/05/2018, 02/07/2019  . PFIZER SARS-COV-2 Vaccination 08/16/2019, 09/06/2019  . Td 12/20/2004  . Tdap 08/16/2010    Diagnostics/Screenings:   Physical Exam: Ht 5\' 8"  (1.727 m) Comment: reported  Wt 180 lb (81.6 kg) Comment: reported   BMI 27.37 kg/m   There was no examination as Joanna Robinson was not present for the video visit.   Impression: 1. Autism spectrum disorder 2. Complex partial seizures with secondary generalization 3. Motor tics  Recommendations for plan of care: [facility]. Joanna Robinson has neither had nor required imaging or lab studies since the last visit, other than what has been performed by her PCP. Mom is aware of those results. Joanna Robinson is a 39 year old woman with autism and complex partial seizures with secondary generalization. She is taking and tolerating Carbatrol and has not experienced convulsive seizures for some time. She has occasional brief partial seizures with her menstrual cycle. She is also having tic behaviors of flinging her head backwards. I talked with Mom about this and recommended no intervention since these behaviors also tend to occur with her menstrual cycle. If this behavior becomes more frequent or if she develops other tics, we  can consider treatment with Clonidine. I asked Mom to let me know if Amma's seizures become more frequent or more severe. I will otherwise see her back in follow up in 1 year or sooner if needed. Mom agreed with the plans made today.   The medication list was reviewed and reconciled. No changes were made in the prescribed medications today. A complete medication list was provided to the patient.  Allergies as of 01/03/2020      Reactions   Codeine    Corticosteroids Nausea Only      Medication List       Accurate as of January 03, 2020 10:31 AM. If you have any questions, ask your nurse or doctor.        acetaZOLAMIDE 250 MG tablet Commonly known as: DIAMOX TAKE 1 TABLET BY MOUTH TWICE DAILY   amoxicillin-clavulanate 875-125 MG tablet Commonly known as: AUGMENTIN Take 1 tablet by mouth 2 (two) times daily.   Aveeno Clear Complexion BB Crea Apply as directed bid   azelastine 0.05 % ophthalmic  solution Commonly known as: OPTIVAR Place 1 drop into both eyes 2 (two) times daily.   Azelastine-Fluticasone 137-50 MCG/ACT Susp USE 1 SPRAY INTO EACH NOSTRIL TWICE DAILY   Beano Tabs Take by mouth as needed (2-3 Tabs as needed prior to meal that may cause bloating.).   benzoyl peroxide-erythromycin gel Commonly known as: BENZAMYCIN APPLY TO AFFECTED AREA(s) TWICE DAILY ASDIRECTED   brompheniramine-pseudoephedrine-DM 30-2-10 MG/5ML syrup Take 5 mLs by mouth 4 (four) times daily as needed.   Carbatrol 200 MG 12 hr capsule Generic drug: carbamazepine TAKE 2 CAPSULES BY MOUTH ONCE EVERY MORNING AND 1 CAPSULE EVERY EVENING   Carbatrol 300 MG 12 hr capsule Generic drug: carbamazepine Take 1 capsule (300 mg total) by mouth at bedtime.   cloNIDine 0.2 MG tablet Commonly known as: CATAPRES TAKE 1 TABLET BY MOUTH AT BEDTIME   DELSYM COUGH RELIEF MT Use as directed in the mouth or throat as needed (2 Tsp. by mouth every 12 hours as needed for cough.). Reported on 04/07/2015   ferrous gluconate 324 MG tablet Commonly known as: FERGON Take 1 tablet (324 mg total) by mouth daily with breakfast.   fexofenadine 180 MG tablet Commonly known as: ALLEGRA TAKE 1 TABLET BY MOUTH ONCE DAILY   fluvoxaMINE 100 MG tablet Commonly known as: LUVOX TAKE 1 TABLET BY MOUTH TWICE DAILY   hydrochlorothiazide 25 MG tablet Commonly known as: HYDRODIURIL TAKE 1 TABLET BY MOUTH ONCE DAILY   hydrocortisone 2.5 % rectal cream Commonly known as: ANUSOL-HC Place 1 application rectally 2 (two) times daily.   Lasix 20 MG tablet Generic drug: furosemide Take 1 tablet by mouth daily.   Linzess 145 MCG Caps capsule Generic drug: linaclotide TAKE 1 CAPSULE BY MOUTH ONCE DAILY   Mapap 500 MG tablet Generic drug: acetaminophen TAKE 2 TABLETS BY MOUTH 3 TIMES DAILY ASNEEDED HEADACHE   montelukast 10 MG tablet Commonly known as: SINGULAIR TAKE 1 TABLET BY MOUTH ONCE DAILY   Mylanta Ultimate  Strength 500-500 MG/5ML Susp Generic drug: Aluminum & Magnesium Hydroxide MYLANTA ULTIMATE STRENGTH, 500-500MG /5ML (Oral Suspension)  1 TID as needed for 0 days  Quantity: 0.00;  Refills: 0   Ordered :14-Mar-2010  Denna Haggard, MA, Anastasiya ;  Started 19-October-2008 Active Comments: Medication taken as needed.   naproxen 500 MG tablet Commonly known as: NAPROSYN TAKE 1 TABLET BY MOUTH TWICE DAILY   omega-3 acid ethyl esters 1 g capsule Commonly known as: LOVAZA Take 1  capsule (1 g total) by mouth daily.   omeprazole 40 MG capsule Commonly known as: PRILOSEC TAKE 1 CAPSULE BY MOUTH ONCE DAILY   polyethylene glycol powder 17 GM/SCOOP powder Commonly known as: GLYCOLAX/MIRALAX MIX 17 GRAMS IN 4 TO 8 OZ OF FLUID AND DRINK AT BEDTIME   potassium chloride 20 MEQ packet Commonly known as: KLOR-CON DISSOLVE 1 PACKET IN 4 OUNCES OF LIQUID AND DRINK ONCE DAILY.   sodium fluoride 1.1 % Crea dental cream Commonly known as: Denta 5000 Plus Place 1 application onto teeth every evening. Brush teeth daily for 1 minute, rinse and spit.   sulfacetamide 10 % ophthalmic solution Commonly known as: BLEPH-10 PLACE 2 DROPS INTO RIGHT EYE 4 TIMES DAILY   triamcinolone cream 0.1 % Commonly known as: KENALOG APPLY TO AFFECTED AREA(s) TWICE DAILY ASDIRECTED   Tucks 50 % Pads Apply 50 % topically as needed (As needed for cleansing and hemorrhoid discomfort.).       Total time spent with the patient's mother was 15 minutes, of which 50% or more was spent in counseling and coordination of care.  Elveria Rising NP-C [facility] Ph. [phone] Fax [phone]

## 2020-01-06 ENCOUNTER — Encounter (INDEPENDENT_AMBULATORY_CARE_PROVIDER_SITE_OTHER): Payer: Self-pay | Admitting: Family

## 2020-01-06 NOTE — Patient Instructions (Signed)
Thank you for meeting with me by video today.   Instructions for you until your next appointment are as follows: 1. Continue giving Joanna Robinson's medications as prescribed.  2. Let me know if she has convulsive seizures or if the seizures during the menstrual cycle worsen 3. Monitor the tic behaviors for now. If they become more frequent or more severe, we will consider treatment with medication 4. Please sign up for MyChart if you have not done so 5.  Please plan to return for follow up in one year or sooner if needed.

## 2020-01-21 ENCOUNTER — Other Ambulatory Visit: Payer: Self-pay | Admitting: Physician Assistant

## 2020-01-21 DIAGNOSIS — D508 Other iron deficiency anemias: Secondary | ICD-10-CM

## 2020-01-21 DIAGNOSIS — K219 Gastro-esophageal reflux disease without esophagitis: Secondary | ICD-10-CM

## 2020-01-21 NOTE — Telephone Encounter (Signed)
Requested Prescriptions  Pending Prescriptions Disp Refills   ferrous gluconate (FERGON) 324 MG tablet [Pharmacy Med Name: FERROUS GLUCONATE 324 (38 FE) MG TA] 180 tablet 0    Sig: TAKE 1 TABLET BY MOUTH TWICE DAILY WITH MEALS     Endocrinology:  Minerals - Iron Supplementation Passed - 01/21/2020  2:48 PM      Passed - HGB in normal range and within 360 days    Hemoglobin  Date Value Ref Range Status  12/30/2019 13.2 11.1 - 15.9 g/dL Final         Passed - HCT in normal range and within 360 days    Hematocrit  Date Value Ref Range Status  12/30/2019 40.2 34.0 - 46.6 % Final         Passed - RBC in normal range and within 360 days    RBC  Date Value Ref Range Status  12/30/2019 4.19 3.77 - 5.28 x10E6/uL Final         Passed - Fe (serum) in normal range and within 360 days    Iron  Date Value Ref Range Status  09/28/2019 76 27 - 159 ug/dL Final   Iron Saturation  Date Value Ref Range Status  09/28/2019 36 15 - 55 % Final         Passed - Ferritin in normal range and within 360 days    Ferritin  Date Value Ref Range Status  09/28/2019 32 15.0 - 150.0 ng/mL Final         Passed - Valid encounter within last 12 months    Recent Outpatient Visits          3 months ago High risk medication use   [facility] [place], Vincent, PA-C   6 months ago Constipation, unspecified constipation type   [facility], Marzella Schlein, MD   8 months ago Iron deficiency anemia secondary to inadequate dietary iron intake   [facility] [place] [name] M, [place]   11 months ago Need for influenza vaccination   [facility] [place], Alessandra Bevels, [place]   1 year ago Pre-op examination   [facility] [name] M, PA-C              omeprazole (PRILOSEC) 40 MG capsule [Pharmacy Med Name: OMEPRAZOLE DR 40 MG CAP] 90 capsule 0    Sig: TAKE 1 CAPSULE BY MOUTH ONCE DAILY     Gastroenterology: Proton  Pump Inhibitors Passed - 01/21/2020  2:48 PM      Passed - Valid encounter within last 12 months    Recent Outpatient Visits          3 months ago High risk medication use   [facility] [place], [place], [place]   6 months ago Constipation, unspecified constipation type   [facility], Marzella Schlein, MD   8 months ago Iron deficiency anemia secondary to inadequate dietary iron intake   [facility] [name] M, [place]   11 months ago Need for influenza vaccination   [facility] [place], Alessandra Bevel[place], [place]   1 year ago Pre-op examination   [facility] [place], [place], [place]

## 2020-02-13 ENCOUNTER — Other Ambulatory Visit: Payer: Self-pay | Admitting: Physician Assistant

## 2020-02-18 ENCOUNTER — Other Ambulatory Visit: Payer: Self-pay | Admitting: Physician Assistant

## 2020-02-18 DIAGNOSIS — D508 Other iron deficiency anemias: Secondary | ICD-10-CM

## 2020-02-18 DIAGNOSIS — E876 Hypokalemia: Secondary | ICD-10-CM

## 2020-02-21 MED ORDER — FERROUS GLUCONATE 324 (38 FE) MG PO TABS: 324.0000 mg | ORAL_TABLET | Freq: Every day | ORAL | 1 refills | Status: DC

## 2020-02-21 MED ORDER — POTASSIUM CHLORIDE 20 MEQ PO PACK: 20.0000 meq | PACK | Freq: Every day | ORAL | 1 refills | Status: DC

## 2020-02-21 NOTE — Telephone Encounter (Signed)
Pts mother called stating that the pt has been out of this medication for 4 days. She is also requesting to have another prescription for a different nasal spray sent into the pharmacy due to insurance no longer covering it. Pts mother also states that she has been given a refill of  iron pills from pharmacy for pt at 2x a day. She states that this has since been changed to 1x a day. She is requesting to know if PCP has changed this back again.  Please advise.

## 2020-02-21 NOTE — Addendum Note (Signed)
Addended by: Margaretann Loveless on: 02/21/2020 11:58 AM   Modules accepted: Orders

## 2020-02-21 NOTE — Addendum Note (Signed)
Addended by: Margaretann Loveless on: 02/21/2020 12:02 PM   Modules accepted: Orders

## 2020-02-21 NOTE — Telephone Encounter (Signed)
Refilled potassium  Stay on once daily iron  Prior Auth was done for Dymista and was approved.

## 2020-03-07 ENCOUNTER — Ambulatory Visit: Payer: Medicare Other | Admitting: Physician Assistant

## 2020-03-07 NOTE — Progress Notes (Signed)
Established patient visit   Patient: Joanna Robinson   DOB: 17-Sep-1980   39 y.o. Female  MRN: [id number] Visit Date: 03/09/2020  Today's healthcare provider: Margaretann Loveless, PA-C   No chief complaint on file.  Subjective    HPI  Patient here for evaluation prior to getting covid 19 booster.   Patient Active Problem List   Diagnosis Date Noted  . Dark stools 07/13/2019  . Abnormal blood chemistry 12/20/2014  . Abscess of axilla 12/20/2014  . Dry eye syndrome 12/20/2014  . Dermoid inclusion cyst 12/20/2014  . Hematoma 12/20/2014  . HLD (hyperlipidemia) 12/20/2014  . Decreased potassium in the blood 12/20/2014  . Irregular bleeding 12/20/2014  . Leukopenia 12/20/2014  . Malaise and fatigue 12/20/2014  . Headache 12/18/2014  . Acne 10/30/2014  . Constipation 09/18/2014  . Neutropenia (HCC) 09/18/2014  . Elevated carbamazepine level 01/25/2014  . Partial epilepsy with impairment of consciousness (HCC) 01/10/2013  . Active autistic disorder 01/10/2013  . Long-term use of high-risk medication 01/10/2013  . Dysmenorrhea 07/10/2008  . Mechanical and motor problems with internal organs 11/08/2007  . Allergic rhinitis 08/20/2007  . External hemorrhoids without complication 11/13/2006  . Edema 12/26/2003  . Epilepsy, not refractory (HCC) 12/26/2003  . Intellectual disability 12/26/2003   Past Medical History:  Diagnosis Date  . Autism   . Epilepsy (HCC)   . Hemorrhoid   . Hyperlipidemia   . Seizures (HCC)        Medications: Outpatient Medications Prior to Visit  Medication Sig  . acetaZOLAMIDE (DIAMOX) 250 MG tablet Take 1 tablet (250 mg total) by mouth 2 (two) times daily.  Marland Kitchen azelastine (OPTIVAR) 0.05 % ophthalmic solution Place 1 drop into both eyes 2 (two) times daily.  . Azelastine-Fluticasone 137-50 MCG/ACT SUSP USE 1 SPRAY INTO EACH NOSTRIL TWICE DAILY  . benzoyl peroxide-erythromycin (BENZAMYCIN) gel APPLY TO AFFECTED AREA(s) TWICE DAILY ASDIRECTED   . CARBATROL 200 MG 12 hr capsule TAKE 2 CAPSULES BY MOUTH ONCE EVERY MORNING AND 1 CAPSULE EVERY EVENING  . CARBATROL 300 MG 12 hr capsule Take 1 capsule (300 mg total) by mouth at bedtime.  . cloNIDine (CATAPRES) 0.2 MG tablet TAKE 1 TABLET BY MOUTH AT BEDTIME  . ferrous gluconate (FERGON) 324 MG tablet Take 1 tablet (324 mg total) by mouth daily with breakfast.  . fexofenadine (ALLEGRA) 180 MG tablet TAKE 1 TABLET BY MOUTH ONCE DAILY  . fluvoxaMINE (LUVOX) 100 MG tablet TAKE 1 TABLET BY MOUTH TWICE DAILY  . furosemide (LASIX) 20 MG tablet Take 1 tablet by mouth daily. 1/2 a tablet daily  . hydrochlorothiazide (HYDRODIURIL) 25 MG tablet TAKE 1 TABLET BY MOUTH ONCE DAILY  . hydrocortisone (ANUSOL-HC) 2.5 % rectal cream Place 1 application rectally 2 (two) times daily. (Patient not taking: Reported on 01/03/2020)  . LINZESS 145 MCG CAPS capsule TAKE 1 CAPSULE BY MOUTH ONCE DAILY  . MAPAP 500 MG tablet TAKE 2 TABLETS BY MOUTH 3 TIMES DAILY ASNEEDED HEADACHE  . montelukast (SINGULAIR) 10 MG tablet TAKE 1 TABLET BY MOUTH ONCE DAILY  . naproxen (NAPROSYN) 500 MG tablet TAKE 1 TABLET BY MOUTH TWICE DAILY  . omeprazole (PRILOSEC) 40 MG capsule TAKE 1 CAPSULE BY MOUTH ONCE DAILY  . polyethylene glycol powder (GLYCOLAX/MIRALAX) 17 GM/SCOOP powder MIX 17 GRAMS IN 4 TO 8 OZ OF FLUID AND DRINK AT BEDTIME  . potassium chloride (KLOR-CON) 20 MEQ packet Take 20 mEq by mouth daily. Dissolve in 4 oz liquid  . sodium  fluoride (DENTA 5000 PLUS) 1.1 % CREA dental cream Place 1 application onto teeth every evening. Brush teeth daily for 1 minute, rinse and spit.  Marland Kitchen sulfacetamide (BLEPH-10) 10 % ophthalmic solution PLACE 2 DROPS INTO RIGHT EYE 4 TIMES DAILY  . triamcinolone cream (KENALOG) 0.1 % APPLY TO AFFECTED AREA(s) TWICE DAILY ASDIRECTED   No facility-administered medications prior to visit.    Review of Systems  Constitutional: Negative.   HENT: Positive for sinus pressure and sore throat. Negative for  congestion and ear pain.   Respiratory: Negative.   Cardiovascular: Negative.   Gastrointestinal: Positive for abdominal pain.  Musculoskeletal: Positive for neck pain.  Neurological: Positive for seizures.    Last CBC Lab Results  Component Value Date   WBC 3.4 12/30/2019   HGB 13.2 12/30/2019   HCT 40.2 12/30/2019   MCV 96 12/30/2019   MCH 31.5 12/30/2019   RDW 11.6 (L) 12/30/2019   PLT 177 12/30/2019   Last metabolic panel Lab Results  Component Value Date   GLUCOSE 86 12/30/2019   NA 137 12/30/2019   K 3.6 12/30/2019   CL 104 12/30/2019   CO2 20 12/30/2019   BUN 11 12/30/2019   CREATININE 0.69 12/30/2019   GFRNONAA 112 12/30/2019   GFRAA 129 12/30/2019   CALCIUM 9.4 12/30/2019   PROT 6.4 12/30/2019   ALBUMIN 4.0 12/30/2019   LABGLOB 2.4 12/30/2019   AGRATIO 1.7 12/30/2019   BILITOT <0.2 12/30/2019   ALKPHOS 103 12/30/2019   AST 14 12/30/2019   ALT 11 12/30/2019      Objective    BP 105/69 (BP Location: Right Arm, Patient Position: Sitting, Cuff Size: Large)   Pulse 68   Temp 98.3 F (36.8 C) (Oral)   Resp 16   Wt 193 lb (87.5 kg)   SpO2 100%   BMI 29.35 kg/m  BP Readings from Last 3 Encounters:  03/09/20 105/69  12/30/19 100/66  09/28/19 104/62   Wt Readings from Last 3 Encounters:  03/09/20 193 lb (87.5 kg)  01/03/20 180 lb (81.6 kg)  12/30/19 195 lb (88.5 kg)      Physical Exam Vitals reviewed.  Constitutional:      General: She is not in acute distress.    Appearance: Normal appearance. She is well-developed and well-nourished. She is not ill-appearing or diaphoretic.  HENT:     Head: Normocephalic and atraumatic.     Right Ear: Hearing, tympanic membrane, ear canal and external ear normal.     Left Ear: Hearing, tympanic membrane, ear canal and external ear normal.     Nose: Nose normal.     Mouth/Throat:     Mouth: Oropharynx is clear and moist and mucous membranes are normal. Mucous membranes are moist.     Pharynx: Oropharynx is  clear. Uvula midline. No oropharyngeal exudate or posterior oropharyngeal erythema.  Eyes:     General: No scleral icterus.       Right eye: No discharge.        Left eye: No discharge.     Extraocular Movements: Extraocular movements intact.     Conjunctiva/sclera: Conjunctivae normal.     Pupils: Pupils are equal, round, and reactive to light.  Neck:     Thyroid: No thyromegaly.     Trachea: No tracheal deviation.  Cardiovascular:     Rate and Rhythm: Normal rate and regular rhythm.     Pulses: Normal pulses.     Heart sounds: Normal heart sounds. No murmur heard. No friction  rub. No gallop.   Pulmonary:     Effort: Pulmonary effort is normal. No respiratory distress.     Breath sounds: Normal breath sounds. No stridor. No wheezing or rales.  Musculoskeletal:     Cervical back: Normal range of motion and neck supple. No tenderness.  Lymphadenopathy:     Cervical: No cervical adenopathy.  Skin:    General: Skin is warm and dry.  Neurological:     Mental Status: She is alert.       No results found for any visits on 03/09/20.  Assessment & Plan     1. Sinus congestion Will give Mucinex for PRN use for sinus pressure and drainage. No signs of infection today on exam. - guaiFENesin (MUCINEX) 600 MG 12 hr tablet; Take 1 tablet (600 mg total) by mouth 2 (two) times daily.  Dispense: 30 tablet; Refill: 0  2. Cough Medication given for PRN cough.  - brompheniramine-pseudoephedrine-DM 30-2-10 MG/5ML syrup; Take 5 mLs by mouth 4 (four) times daily as needed.  Dispense: 120 mL; Refill: 1  3. Need for COVID-19 vaccine Covid 19 booster Vaccine given to patient without complications. Patient sat for 15 minutes after administration and was tolerated well without adverse effects. - Pfizer SARS-COV-2 Vaccine   No follow-ups on file.      Delmer Islam, PA-C, have reviewed all documentation for this visit. The documentation on 03/09/20 for the exam, diagnosis, procedures,  and orders are all accurate and complete.   Reine Just  [facility] [phone] (phone) [phone] (fax)  [facility]

## 2020-03-09 ENCOUNTER — Encounter: Payer: Self-pay | Admitting: Physician Assistant

## 2020-03-09 ENCOUNTER — Ambulatory Visit (INDEPENDENT_AMBULATORY_CARE_PROVIDER_SITE_OTHER): Payer: Medicare Other | Admitting: Physician Assistant

## 2020-03-09 ENCOUNTER — Other Ambulatory Visit: Payer: Self-pay

## 2020-03-09 VITALS — BP 105/69 | HR 68 | Temp 98.3°F | Resp 16 | Wt 193.0 lb

## 2020-03-09 DIAGNOSIS — R0981 Nasal congestion: Secondary | ICD-10-CM | POA: Diagnosis not present

## 2020-03-09 DIAGNOSIS — R059 Cough, unspecified: Secondary | ICD-10-CM | POA: Diagnosis not present

## 2020-03-09 DIAGNOSIS — Z23 Encounter for immunization: Secondary | ICD-10-CM | POA: Diagnosis not present

## 2020-03-09 MED ORDER — GUAIFENESIN ER 600 MG PO TB12: 600.0000 mg | ORAL_TABLET | Freq: Two times a day (BID) | ORAL | 0 refills | Status: DC

## 2020-03-09 MED ORDER — PSEUDOEPH-BROMPHEN-DM 30-2-10 MG/5ML PO SYRP: 5.0000 mL | ORAL_SOLUTION | Freq: Four times a day (QID) | ORAL | 1 refills | Status: DC | PRN

## 2020-03-12 ENCOUNTER — Other Ambulatory Visit: Payer: Self-pay | Admitting: Physician Assistant

## 2020-03-12 DIAGNOSIS — G479 Sleep disorder, unspecified: Secondary | ICD-10-CM

## 2020-03-14 ENCOUNTER — Other Ambulatory Visit: Payer: Self-pay | Admitting: Physician Assistant

## 2020-03-14 DIAGNOSIS — G479 Sleep disorder, unspecified: Secondary | ICD-10-CM

## 2020-03-14 MED ORDER — CLONIDINE HCL 0.2 MG PO TABS: 0.2000 mg | ORAL_TABLET | Freq: Every day | ORAL | 0 refills | Status: DC

## 2020-03-14 NOTE — Telephone Encounter (Signed)
Medication Refill - Medication: cloNIDine (CATAPRES) 0.2 MG tablet  (Pharmacy is requesting medication be sent back over due to patient being completely out of medication)   Has the patient contacted their pharmacy?yes (Agent: If no, request that the patient contact the pharmacy for the refill.) (Agent: If yes, when and what did the pharmacy advise?)Contact PCP  Preferred Pharmacy (with phone number or street name):  [facility] - [place], [place] - [address] Phone:  [phone]  Fax:  [phone]       Agent: Please be advised that RX refills may take up to 3 business days. We ask that you follow-up with your pharmacy.

## 2020-03-16 ENCOUNTER — Other Ambulatory Visit: Payer: Self-pay | Admitting: Physician Assistant

## 2020-03-16 DIAGNOSIS — G479 Sleep disorder, unspecified: Secondary | ICD-10-CM

## 2020-04-18 ENCOUNTER — Other Ambulatory Visit: Payer: Self-pay | Admitting: Physician Assistant

## 2020-04-18 DIAGNOSIS — J302 Other seasonal allergic rhinitis: Secondary | ICD-10-CM

## 2020-05-08 ENCOUNTER — Other Ambulatory Visit: Payer: Self-pay | Admitting: Physician Assistant

## 2020-05-08 DIAGNOSIS — R609 Edema, unspecified: Secondary | ICD-10-CM

## 2020-05-13 ENCOUNTER — Other Ambulatory Visit: Payer: Self-pay | Admitting: Physician Assistant

## 2020-05-13 DIAGNOSIS — E876 Hypokalemia: Secondary | ICD-10-CM

## 2020-05-16 ENCOUNTER — Other Ambulatory Visit: Payer: Self-pay

## 2020-05-16 ENCOUNTER — Ambulatory Visit (INDEPENDENT_AMBULATORY_CARE_PROVIDER_SITE_OTHER): Payer: Medicare Other | Admitting: Physician Assistant

## 2020-05-16 ENCOUNTER — Encounter: Payer: Self-pay | Admitting: Physician Assistant

## 2020-05-16 VITALS — BP 101/63 | HR 74 | Temp 98.7°F | Wt 192.0 lb

## 2020-05-16 DIAGNOSIS — T148XXA Other injury of unspecified body region, initial encounter: Secondary | ICD-10-CM

## 2020-05-16 DIAGNOSIS — D702 Other drug-induced agranulocytosis: Secondary | ICD-10-CM

## 2020-05-16 DIAGNOSIS — Z79899 Other long term (current) drug therapy: Secondary | ICD-10-CM | POA: Diagnosis not present

## 2020-05-16 DIAGNOSIS — M542 Cervicalgia: Secondary | ICD-10-CM

## 2020-05-16 DIAGNOSIS — J0140 Acute pansinusitis, unspecified: Secondary | ICD-10-CM | POA: Diagnosis not present

## 2020-05-16 MED ORDER — AMOXICILLIN-POT CLAVULANATE 875-125 MG PO TABS: 1.0000 | ORAL_TABLET | Freq: Two times a day (BID) | ORAL | 0 refills | Status: DC

## 2020-05-16 NOTE — Progress Notes (Signed)
Established patient visit   Patient: Joanna Robinson   DOB: [birth year]   39 y.o. Female  MRN: [id number] Visit Date: 05/16/2020  Today's healthcare provider: Margaretann Loveless, PA-C   Chief Complaint  Patient presents with  . Neck Pain  . Nasal Congestion   Subjective    HPI  Joanna Robinson is a 40 yr old female that presents today for neck pain and nasal congestion. Neck pain has been persistent for a few months now. Patient does have autism and does have tic of throwing her head back.   Also having complaints of sore throat, nasal congestion, nose pain, and tugging on the right ear.   Patient Active Problem List   Diagnosis Date Noted  . Dark stools 07/13/2019  . Abnormal blood chemistry 12/20/2014  . Abscess of axilla 12/20/2014  . Dry eye syndrome 12/20/2014  . Dermoid inclusion cyst 12/20/2014  . Hematoma 12/20/2014  . HLD (hyperlipidemia) 12/20/2014  . Decreased potassium in the blood 12/20/2014  . Irregular bleeding 12/20/2014  . Leukopenia 12/20/2014  . Malaise and fatigue 12/20/2014  . Headache 12/18/2014  . Acne 10/30/2014  . Constipation 09/18/2014  . Neutropenia (HCC) 09/18/2014  . Elevated carbamazepine level 01/25/2014  . Partial epilepsy with impairment of consciousness (HCC) 01/10/2013  . Active autistic disorder 01/10/2013  . Long-term use of high-risk medication 01/10/2013  . Dysmenorrhea 07/10/2008  . Mechanical and motor problems with internal organs 11/08/2007  . Allergic rhinitis 08/20/2007  . External hemorrhoids without complication 11/13/2006  . Edema 12/26/2003  . Epilepsy, not refractory (HCC) 12/26/2003  . Intellectual disability 12/26/2003   Past Medical History:  Diagnosis Date  . Autism   . Epilepsy (HCC)   . Hemorrhoid   . Hyperlipidemia   . Seizures (HCC)    Social History   Tobacco Use  . Smoking status: Never Smoker  . Smokeless tobacco: Never Used  Vaping Use  . Vaping Use: Never used  Substance Use Topics   . Alcohol use: No  . Drug use: No   Allergies  Allergen Reactions  . Codeine   . Corticosteroids Nausea Only     Medications: Outpatient Medications Prior to Visit  Medication Sig  . acetaZOLAMIDE (DIAMOX) 250 MG tablet Take 1 tablet (250 mg total) by mouth 2 (two) times daily.  Marland Kitchen azelastine (OPTIVAR) 0.05 % ophthalmic solution Place 1 drop into both eyes 2 (two) times daily.  . Azelastine-Fluticasone 137-50 MCG/ACT SUSP USE 1 SPRAY INTO EACH NOSTRIL TWICE DAILY  . benzoyl peroxide-erythromycin (BENZAMYCIN) gel APPLY TO AFFECTED AREA(s) TWICE DAILY ASDIRECTED  . brompheniramine-pseudoephedrine-DM 30-2-10 MG/5ML syrup Take 5 mLs by mouth 4 (four) times daily as needed.  . CARBATROL 200 MG 12 hr capsule TAKE 2 CAPSULES BY MOUTH ONCE EVERY MORNING AND 1 CAPSULE EVERY EVENING  . CARBATROL 300 MG 12 hr capsule Take 1 capsule (300 mg total) by mouth at bedtime.  . cloNIDine (CATAPRES) 0.2 MG tablet Take 1 tablet (0.2 mg total) by mouth at bedtime.  . ferrous gluconate (FERGON) 324 MG tablet Take 1 tablet (324 mg total) by mouth daily with breakfast.  . fexofenadine (ALLEGRA) 180 MG tablet TAKE 1 TABLET BY MOUTH ONCE DAILY  . fluvoxaMINE (LUVOX) 100 MG tablet TAKE 1 TABLET BY MOUTH TWICE DAILY  . furosemide (LASIX) 20 MG tablet Take 1 tablet by mouth daily. 1/2 a tablet daily  . guaiFENesin (MUCINEX) 600 MG 12 hr tablet Take 1 tablet (600 mg total) by mouth 2 (two)  times daily.  . hydrochlorothiazide (HYDRODIURIL) 25 MG tablet TAKE 1 TABLET BY MOUTH ONCE DAILY  . hydrocortisone (ANUSOL-HC) 2.5 % rectal cream Place 1 application rectally 2 (two) times daily.  Marland Kitchen LINZESS 145 MCG CAPS capsule TAKE 1 CAPSULE BY MOUTH ONCE DAILY  . MAPAP 500 MG tablet TAKE 2 TABLETS BY MOUTH 3 TIMES DAILY ASNEEDED HEADACHE  . montelukast (SINGULAIR) 10 MG tablet TAKE 1 TABLET BY MOUTH ONCE EVERY EVENING  . naproxen (NAPROSYN) 500 MG tablet TAKE 1 TABLET BY MOUTH TWICE DAILY  . omeprazole (PRILOSEC) 40 MG capsule  TAKE 1 CAPSULE BY MOUTH ONCE DAILY  . polyethylene glycol powder (GLYCOLAX/MIRALAX) 17 GM/SCOOP powder MIX 17 GRAMS IN 4 TO 8 OZ OF FLUID AND DRINK AT BEDTIME  . potassium chloride (KLOR-CON) 20 MEQ packet DISSOLVE 1 PACKET IN 4 OZ OF LIQUID AND DRINK DAILY  . sodium fluoride (DENTA 5000 PLUS) 1.1 % CREA dental cream Place 1 application onto teeth every evening. Brush teeth daily for 1 minute, rinse and spit.  Marland Kitchen sulfacetamide (BLEPH-10) 10 % ophthalmic solution PLACE 2 DROPS INTO RIGHT EYE 4 TIMES DAILY  . triamcinolone cream (KENALOG) 0.1 % APPLY TO AFFECTED AREA(s) TWICE DAILY ASDIRECTED   No facility-administered medications prior to visit.    Review of Systems  Constitutional: Negative.   HENT: Positive for congestion, postnasal drip, rhinorrhea and sore throat.   Respiratory: Positive for cough. Negative for apnea, choking, chest tightness, shortness of breath, wheezing and stridor.   Cardiovascular: Negative.   Musculoskeletal: Positive for neck pain.        Objective    BP 101/63 (BP Location: Right Arm, Patient Position: Sitting, Cuff Size: Large)   Pulse 74   Temp 98.7 F (37.1 C) (Oral)   Wt 192 lb (87.1 kg)   BMI 29.19 kg/m    Physical Exam Vitals reviewed.  Constitutional:      General: She is not in acute distress.    Appearance: Normal appearance. She is well-developed and well-nourished. She is not ill-appearing or diaphoretic.  HENT:     Head: Normocephalic and atraumatic.     Right Ear: Hearing, ear canal and external ear normal. A middle ear effusion (opaque) is present. No mastoid tenderness. Tympanic membrane is erythematous. Tympanic membrane is not bulging.     Left Ear: Hearing, tympanic membrane, ear canal and external ear normal.     Nose: Congestion present.     Mouth/Throat:     Mouth: Oropharynx is clear and moist and mucous membranes are normal.     Pharynx: Uvula midline. No oropharyngeal exudate.  Eyes:     General: No scleral icterus.        Right eye: No discharge.        Left eye: No discharge.     Conjunctiva/sclera: Conjunctivae normal.     Pupils: Pupils are equal, round, and reactive to light.  Neck:     Thyroid: No thyromegaly.     Trachea: No tracheal deviation.  Cardiovascular:     Rate and Rhythm: Normal rate and regular rhythm.     Heart sounds: Normal heart sounds. No murmur heard. No friction rub. No gallop.   Pulmonary:     Effort: Pulmonary effort is normal. No respiratory distress.     Breath sounds: Normal breath sounds. No stridor. No wheezing or rales.  Musculoskeletal:     Cervical back: Normal range of motion and neck supple.  Lymphadenopathy:     Cervical: No cervical adenopathy.  Skin:    General: Skin is warm and dry.  Neurological:     Mental Status: She is alert. Mental status is at baseline.      No results found for any visits on 05/16/20.  Assessment & Plan     1. Neck pain Will get xray as below.  - DG Cervical Spine Complete; Future  2. Acute non-recurrent pansinusitis Worsening symptoms that have not responded to OTC medications. Will give augmentin as below. Continue allergy medications. Stay well hydrated and get plenty of rest. Call if no symptom improvement or if symptoms worsen. - amoxicillin-clavulanate (AUGMENTIN) 875-125 MG tablet; Take 1 tablet by mouth 2 (two) times daily.  Dispense: 20 tablet; Refill: 0  3. Bruising Will check labs as below and f/u pending results. - CBC w/Diff/Platelet - Comprehensive Metabolic Panel (CMET)  4. Other drug-induced neutropenia (HCC) Will check labs as below and f/u pending results. - CBC w/Diff/Platelet - Comprehensive Metabolic Panel (CMET) - Carbamazepine Level (Tegretol), total  5. Long-term use of high-risk medication Will check labs as below and f/u pending results. - CBC w/Diff/Platelet - Comprehensive Metabolic Panel (CMET) - Carbamazepine Level (Tegretol), total   No follow-ups on file.      Delmer Islam, PA-C, have reviewed all documentation for this visit. The documentation on 05/16/20 for the exam, diagnosis, procedures, and orders are all accurate and complete.   Reine Just  [facility] [phone] (phone) [phone] (fax)  [facility]

## 2020-05-17 LAB — CBC WITH DIFFERENTIAL/PLATELET
Basophils Absolute: 0 10*3/uL (ref 0.0–0.2)
Basos: 0 %
EOS (ABSOLUTE): 0.1 10*3/uL (ref 0.0–0.4)
Eos: 2 %
Hematocrit: 42 % (ref 34.0–46.6)
Hemoglobin: 13.6 g/dL (ref 11.1–15.9)
Immature Grans (Abs): 0 10*3/uL (ref 0.0–0.1)
Immature Granulocytes: 0 %
Lymphocytes Absolute: 1.3 10*3/uL (ref 0.7–3.1)
Lymphs: 31 %
MCH: 30.9 pg (ref 26.6–33.0)
MCHC: 32.4 g/dL (ref 31.5–35.7)
MCV: 96 fL (ref 79–97)
Monocytes Absolute: 0.5 10*3/uL (ref 0.1–0.9)
Monocytes: 12 %
Neutrophils Absolute: 2.3 10*3/uL (ref 1.4–7.0)
Neutrophils: 55 %
Platelets: 199 10*3/uL (ref 150–450)
RBC: 4.4 x10E6/uL (ref 3.77–5.28)
RDW: 12 % (ref 11.7–15.4)
WBC: 4.1 10*3/uL (ref 3.4–10.8)

## 2020-05-17 LAB — COMPREHENSIVE METABOLIC PANEL
ALT: 11 IU/L (ref 0–32)
AST: 17 IU/L (ref 0–40)
Albumin/Globulin Ratio: 1.5 (ref 1.2–2.2)
Albumin: 4.1 g/dL (ref 3.8–4.8)
Alkaline Phosphatase: 106 IU/L (ref 44–121)
BUN/Creatinine Ratio: 14 (ref 9–23)
BUN: 10 mg/dL (ref 6–20)
Bilirubin Total: <0.2 mg/dL (ref 0.0–1.2)
CO2: 20 mmol/L (ref 20–29)
Calcium: 9.4 mg/dL (ref 8.7–10.2)
Chloride: 101 mmol/L (ref 96–106)
Creatinine, Ser: 0.72 mg/dL (ref 0.57–1.00)
GFR calc Af Amer: 124 mL/min/{1.73_m2} (ref >59)
GFR calc non Af Amer: 107 mL/min/{1.73_m2} (ref >59)
Globulin, Total: 2.7 g/dL (ref 1.5–4.5)
Glucose: 88 mg/dL (ref 65–99)
Potassium: 3.9 mmol/L (ref 3.5–5.2)
Sodium: 137 mmol/L (ref 134–144)
Total Protein: 6.8 g/dL (ref 6.0–8.5)

## 2020-05-17 LAB — CARBAMAZEPINE LEVEL, TOTAL: Carbamazepine (Tegretol), S: 15.6 ug/mL (ref 4.0–12.0)

## 2020-06-05 ENCOUNTER — Other Ambulatory Visit: Payer: Self-pay | Admitting: Physician Assistant

## 2020-06-05 DIAGNOSIS — D508 Other iron deficiency anemias: Secondary | ICD-10-CM

## 2020-06-05 DIAGNOSIS — G479 Sleep disorder, unspecified: Secondary | ICD-10-CM

## 2020-06-13 ENCOUNTER — Ambulatory Visit
Admission: RE | Admit: 2020-06-13 | Discharge: 2020-06-13 | Disposition: A | Discharge status: Home / Self Care | Payer: Medicare Other | Source: Ambulatory Visit | Attending: Physician Assistant | Admitting: Physician Assistant

## 2020-06-13 ENCOUNTER — Ambulatory Visit (INDEPENDENT_AMBULATORY_CARE_PROVIDER_SITE_OTHER): Payer: Medicare Other | Admitting: Physician Assistant

## 2020-06-13 ENCOUNTER — Encounter: Payer: Self-pay | Admitting: Physician Assistant

## 2020-06-13 ENCOUNTER — Other Ambulatory Visit: Payer: Self-pay

## 2020-06-13 VITALS — BP 94/67 | HR 73 | Temp 98.8°F | Resp 16 | Wt 192.8 lb

## 2020-06-13 DIAGNOSIS — J329 Chronic sinusitis, unspecified: Secondary | ICD-10-CM

## 2020-06-13 DIAGNOSIS — J029 Acute pharyngitis, unspecified: Secondary | ICD-10-CM

## 2020-06-13 DIAGNOSIS — M542 Cervicalgia: Secondary | ICD-10-CM | POA: Insufficient documentation

## 2020-06-13 DIAGNOSIS — R0981 Nasal congestion: Secondary | ICD-10-CM | POA: Diagnosis not present

## 2020-06-13 NOTE — Progress Notes (Signed)
Established patient visit   Patient: Joanna Robinson   DOB: [birth year]   39 y.o. Female  MRN: [id number] Visit Date: 06/13/2020  Today's healthcare provider: Margaretann Loveless, PA-C   Chief Complaint  Patient presents with  . Follow-up   Subjective    HPI  Follow up for neck pain  The patient was last seen for this 1 months ago. Changes made at last visit include none, monitoring.  She reports excellent compliance with treatment. She feels that condition is Unchanged. She is not having side effects.   -----------------------------------------------------------------------------------------  Patient Active Problem List   Diagnosis Date Noted  . Dark stools 07/13/2019  . Abnormal blood chemistry 12/20/2014  . Abscess of axilla 12/20/2014  . Dry eye syndrome 12/20/2014  . Dermoid inclusion cyst 12/20/2014  . Hematoma 12/20/2014  . HLD (hyperlipidemia) 12/20/2014  . Decreased potassium in the blood 12/20/2014  . Irregular bleeding 12/20/2014  . Leukopenia 12/20/2014  . Malaise and fatigue 12/20/2014  . Headache 12/18/2014  . Acne 10/30/2014  . Constipation 09/18/2014  . Neutropenia (HCC) 09/18/2014  . Elevated carbamazepine level 01/25/2014  . Partial epilepsy with impairment of consciousness (HCC) 01/10/2013  . Active autistic disorder 01/10/2013  . Long-term use of high-risk medication 01/10/2013  . Dysmenorrhea 07/10/2008  . Mechanical and motor problems with internal organs 11/08/2007  . Allergic rhinitis 08/20/2007  . External hemorrhoids without complication 11/13/2006  . Edema 12/26/2003  . Epilepsy, not refractory (HCC) 12/26/2003  . Intellectual disability 12/26/2003   Social History   Tobacco Use  . Smoking status: Never Smoker  . Smokeless tobacco: Never Used  Vaping Use  . Vaping Use: Never used  Substance Use Topics  . Alcohol use: No  . Drug use: No   Allergies  Allergen Reactions  . Codeine   . Corticosteroids Nausea Only        Medications: Outpatient Medications Prior to Visit  Medication Sig  . acetaZOLAMIDE (DIAMOX) 250 MG tablet Take 1 tablet (250 mg total) by mouth 2 (two) times daily.  Marland Kitchen amoxicillin-clavulanate (AUGMENTIN) 875-125 MG tablet Take 1 tablet by mouth 2 (two) times daily.  Marland Kitchen azelastine (OPTIVAR) 0.05 % ophthalmic solution Place 1 drop into both eyes 2 (two) times daily.  . Azelastine-Fluticasone 137-50 MCG/ACT SUSP USE 1 SPRAY INTO EACH NOSTRIL TWICE DAILY  . benzoyl peroxide-erythromycin (BENZAMYCIN) gel APPLY TO AFFECTED AREA(s) TWICE DAILY ASDIRECTED  . brompheniramine-pseudoephedrine-DM 30-2-10 MG/5ML syrup Take 5 mLs by mouth 4 (four) times daily as needed.  . CARBATROL 200 MG 12 hr capsule TAKE 2 CAPSULES BY MOUTH ONCE EVERY MORNING AND 1 CAPSULE EVERY EVENING  . CARBATROL 300 MG 12 hr capsule Take 1 capsule (300 mg total) by mouth at bedtime.  . cloNIDine (CATAPRES) 0.2 MG tablet TAKE 1 TABLET BY MOUTH AT BEDTIME  . ferrous gluconate (FERGON) 324 MG tablet TAKE 1 TABLET BY MOUTH ONCE DAILY WITH BREAKFAST  . fexofenadine (ALLEGRA) 180 MG tablet TAKE 1 TABLET BY MOUTH ONCE DAILY  . fluvoxaMINE (LUVOX) 100 MG tablet TAKE 1 TABLET BY MOUTH TWICE DAILY  . furosemide (LASIX) 20 MG tablet Take 1 tablet by mouth daily. 1/2 a tablet daily  . guaiFENesin (MUCINEX) 600 MG 12 hr tablet Take 1 tablet (600 mg total) by mouth 2 (two) times daily.  . hydrochlorothiazide (HYDRODIURIL) 25 MG tablet TAKE 1 TABLET BY MOUTH ONCE DAILY  . hydrocortisone (ANUSOL-HC) 2.5 % rectal cream Place 1 application rectally 2 (two) times daily.  Marland Kitchen Karlene Einstein  145 MCG CAPS capsule TAKE 1 CAPSULE BY MOUTH ONCE DAILY  . MAPAP 500 MG tablet TAKE 2 TABLETS BY MOUTH 3 TIMES DAILY ASNEEDED HEADACHE  . montelukast (SINGULAIR) 10 MG tablet TAKE 1 TABLET BY MOUTH ONCE EVERY EVENING  . naproxen (NAPROSYN) 500 MG tablet TAKE 1 TABLET BY MOUTH TWICE DAILY  . omeprazole (PRILOSEC) 40 MG capsule TAKE 1 CAPSULE BY MOUTH ONCE DAILY  .  polyethylene glycol powder (GLYCOLAX/MIRALAX) 17 GM/SCOOP powder MIX 17 GRAMS IN 4 TO 8 OZ OF FLUID AND DRINK AT BEDTIME  . potassium chloride (KLOR-CON) 20 MEQ packet DISSOLVE 1 PACKET IN 4 OZ OF LIQUID AND DRINK DAILY  . sodium fluoride (DENTA 5000 PLUS) 1.1 % CREA dental cream Place 1 application onto teeth every evening. Brush teeth daily for 1 minute, rinse and spit.  Marland Kitchen sulfacetamide (BLEPH-10) 10 % ophthalmic solution PLACE 2 DROPS INTO RIGHT EYE 4 TIMES DAILY  . triamcinolone cream (KENALOG) 0.1 % APPLY TO AFFECTED AREA(s) TWICE DAILY ASDIRECTED   No facility-administered medications prior to visit.    Review of Systems  Constitutional: Negative.   Respiratory: Negative.   Cardiovascular: Negative.   Neurological: Negative.     Last CBC Lab Results  Component Value Date   WBC 4.1 05/16/2020   HGB 13.6 05/16/2020   HCT 42.0 05/16/2020   MCV 96 05/16/2020   MCH 30.9 05/16/2020   RDW 12.0 05/16/2020   PLT 199 05/16/2020   Last metabolic panel Lab Results  Component Value Date   GLUCOSE 88 05/16/2020   NA 137 05/16/2020   K 3.9 05/16/2020   CL 101 05/16/2020   CO2 20 05/16/2020   BUN 10 05/16/2020   CREATININE 0.72 05/16/2020   GFRNONAA 107 05/16/2020   GFRAA 124 05/16/2020   CALCIUM 9.4 05/16/2020   PROT 6.8 05/16/2020   ALBUMIN 4.1 05/16/2020   LABGLOB 2.7 05/16/2020   AGRATIO 1.5 05/16/2020   BILITOT <0.2 05/16/2020   ALKPHOS 106 05/16/2020   AST 17 05/16/2020   ALT 11 05/16/2020   Last lipids Lab Results  Component Value Date   CHOL 217 (H) 12/30/2019   HDL 74 12/30/2019   LDLCALC 135 (H) 12/30/2019   TRIG 44 12/30/2019   CHOLHDL 2.5 10/01/2018   Last hemoglobin A1c Lab Results  Component Value Date   HGBA1C 5.2 12/30/2019        Objective    BP 94/67 (BP Location: Right Arm, Patient Position: Sitting, Cuff Size: Large)   Pulse 73   Temp 98.8 F (37.1 C) (Oral)   Resp 16   Wt 192 lb 12.8 oz (87.5 kg)   SpO2 100%   BMI 29.32 kg/m  BP  Readings from Last 3 Encounters:  06/13/20 94/67  05/16/20 101/63  03/09/20 105/69   Wt Readings from Last 3 Encounters:  06/13/20 192 lb 12.8 oz (87.5 kg)  05/16/20 192 lb (87.1 kg)  03/09/20 193 lb (87.5 kg)      Physical Exam Vitals reviewed.  Constitutional:      General: She is not in acute distress.    Appearance: Normal appearance. She is not ill-appearing.  HENT:     Head: Normocephalic and atraumatic.     Right Ear: Tympanic membrane, ear canal and external ear normal.     Left Ear: Tympanic membrane, ear canal and external ear normal.     Nose: Congestion and rhinorrhea present.     Mouth/Throat:     Mouth: Mucous membranes are moist.  Pharynx: Posterior oropharyngeal erythema present. No oropharyngeal exudate.  Eyes:     Extraocular Movements: Extraocular movements intact.     Pupils: Pupils are equal, round, and reactive to light.  Cardiovascular:     Rate and Rhythm: Normal rate and regular rhythm.     Pulses: Normal pulses.     Heart sounds: No murmur heard.   Pulmonary:     Effort: Pulmonary effort is normal. No respiratory distress.     Breath sounds: Normal breath sounds. No wheezing.  Musculoskeletal:     Cervical back: Normal range of motion and neck supple. Tenderness present.  Skin:    Capillary Refill: Capillary refill takes less than 2 seconds.  Neurological:     Mental Status: She is alert.  Psychiatric:        Mood and Affect: Mood normal.        Thought Content: Thought content normal.      No results found for any visits on 06/13/20.  Assessment & Plan     1. Neck pain Will get xray. May consider muscle relaxer.  2. Nasal congestion Worsening. On maximum allergy medications. Referral to ENT placed for further evaluation.  - Ambulatory referral to ENT  3. Chronic congestion of paranasal sinus See above medical treatment plan. - Ambulatory referral to ENT  4. Sore throat See above medical treatment plan. - Ambulatory  referral to ENT   No follow-ups on file.      Delmer Islam, PA-C, have reviewed all documentation for this visit. The documentation on 06/29/20 for the exam, diagnosis, procedures, and orders are all accurate and complete.   Reine Just  [facility] [phone] (phone) [phone] (fax)  [facility]

## 2020-06-13 NOTE — Patient Instructions (Signed)
Murray &amp; Nadel's Textbook of Respiratory Medicine (7th ed., pp. 508-520). Elsevier.">  Postnasal Drip Postnasal drip is the feeling of mucus going down the back of your throat. Mucus is a slimy substance that moistens and cleans your nose and throat, as well as the air pockets in face bones near your forehead and cheeks (sinuses). Small amounts of mucus pass from your nose and sinuses down the back of your throat all the time. This is normal. When you produce too much mucus or the mucus gets too thick, you can feel it. Some common causes of postnasal drip include:  Having more mucus because of: ? A cold or the flu. ? Allergies. ? Cold air. ? Certain medicines.  Having more mucus that is thicker because of: ? A sinus or nasal infection. ? Dry air. ? A food allergy. Follow these instructions at home: Relieving discomfort  Gargle with a salt-water mixture 3-4 times a day or as needed. To make a salt-water mixture, completely dissolve -1 tsp of salt in 1 cup of warm water.  If the air in your home is dry, use a humidifier to add moisture to the air.  Use a saline spray or container (neti pot) to flush out the nose (nasal irrigation). These methods can help clear away mucus and keep the nasal passages moist.   General instructions  Take over-the-counter and prescription medicines only as told by your health care provider.  Follow instructions from your health care provider about eating or drinking restrictions. You may need to avoid caffeine.  Avoid things that you know you are allergic to (allergens), like dust, mold, pollen, pets, or certain foods.  Drink enough fluid to keep your urine pale yellow.  Keep all follow-up visits as told by your health care provider. This is important. Contact a health care provider if:  You have a fever.  You have a sore throat.  You have difficulty swallowing.  You have headache.  You have sinus pain.  You have a cough that does not go  away.  The mucus from your nose becomes thick and is green or yellow in color.  You have cold or flu symptoms that last more than 10 days. Summary  Postnasal drip is the feeling of mucus going down the back of your throat.  If your health care provider approves, use nasal irrigation or a nasal spray 2?4 times a day.  Avoid things that you know you are allergic to (allergens), like dust, mold, pollen, pets, or certain foods. This information is not intended to replace advice given to you by your health care provider. Make sure you discuss any questions you have with your health care provider. Document Revised: 12/20/2019 Document Reviewed: 12/20/2019 Elsevier Patient Education  2021 Elsevier Inc.  

## 2020-06-15 ENCOUNTER — Telehealth: Payer: Self-pay

## 2020-06-15 DIAGNOSIS — M62838 Other muscle spasm: Secondary | ICD-10-CM

## 2020-06-15 NOTE — Telephone Encounter (Signed)
-----   Message from Margaretann Loveless, [place] sent at 06/15/2020  2:23 PM EDT ----- Cervical spine overall looks ok. It does mention straightening of the normal curvature. This is something that is normally seen either by how she was holding her head when they took the images vs muscle spasm. We could try a low dose muscle relaxer if desired to see if that helps her neck pain.

## 2020-06-15 NOTE — Telephone Encounter (Signed)
Called patient's mother [name]) and no answer, LVMTRC. If patient's mother calls back okay for PEC to advise of results.

## 2020-06-18 MED ORDER — BACLOFEN 10 MG PO TABS: 10.0000 mg | ORAL_TABLET | Freq: Two times a day (BID) | ORAL | 5 refills | Status: DC

## 2020-06-18 NOTE — Telephone Encounter (Signed)
Mother returned call: notified of imaging result and PCP recommendation.  She would like to try a low dose muscle relaxer and see if that helps.Please let her know when sent to pharmacy so she can pick up.

## 2020-06-18 NOTE — Telephone Encounter (Signed)
Sent in Baclofen.

## 2020-06-29 ENCOUNTER — Encounter: Payer: Self-pay | Admitting: Physician Assistant

## 2020-07-03 ENCOUNTER — Other Ambulatory Visit (INDEPENDENT_AMBULATORY_CARE_PROVIDER_SITE_OTHER): Payer: Self-pay | Admitting: Family

## 2020-07-03 ENCOUNTER — Other Ambulatory Visit: Payer: Self-pay | Admitting: Physician Assistant

## 2020-07-03 ENCOUNTER — Other Ambulatory Visit: Payer: Self-pay | Admitting: Pediatrics

## 2020-07-03 DIAGNOSIS — J302 Other seasonal allergic rhinitis: Secondary | ICD-10-CM

## 2020-07-03 DIAGNOSIS — G40209 Localization-related (focal) (partial) symptomatic epilepsy and epileptic syndromes with complex partial seizures, not intractable, without status epilepticus: Secondary | ICD-10-CM

## 2020-07-03 DIAGNOSIS — K219 Gastro-esophageal reflux disease without esophagitis: Secondary | ICD-10-CM

## 2020-07-03 DIAGNOSIS — H1045 Other chronic allergic conjunctivitis: Secondary | ICD-10-CM | POA: Diagnosis not present

## 2020-07-03 DIAGNOSIS — N946 Dysmenorrhea, unspecified: Secondary | ICD-10-CM

## 2020-07-03 DIAGNOSIS — G479 Sleep disorder, unspecified: Secondary | ICD-10-CM

## 2020-07-03 NOTE — Telephone Encounter (Signed)
Please send to the pharmacy °

## 2020-07-18 DIAGNOSIS — J301 Allergic rhinitis due to pollen: Secondary | ICD-10-CM | POA: Diagnosis not present

## 2020-07-18 DIAGNOSIS — M542 Cervicalgia: Secondary | ICD-10-CM | POA: Diagnosis not present

## 2020-07-22 ENCOUNTER — Other Ambulatory Visit: Payer: Self-pay | Admitting: Physician Assistant

## 2020-07-22 DIAGNOSIS — R0981 Nasal congestion: Secondary | ICD-10-CM

## 2020-07-29 ENCOUNTER — Encounter (INDEPENDENT_AMBULATORY_CARE_PROVIDER_SITE_OTHER): Payer: Self-pay

## 2020-08-02 ENCOUNTER — Other Ambulatory Visit: Payer: Self-pay | Admitting: Physician Assistant

## 2020-08-02 ENCOUNTER — Other Ambulatory Visit: Payer: Self-pay | Admitting: Family Medicine

## 2020-08-02 DIAGNOSIS — K219 Gastro-esophageal reflux disease without esophagitis: Secondary | ICD-10-CM

## 2020-08-02 DIAGNOSIS — N946 Dysmenorrhea, unspecified: Secondary | ICD-10-CM

## 2020-08-02 DIAGNOSIS — G479 Sleep disorder, unspecified: Secondary | ICD-10-CM

## 2020-08-02 DIAGNOSIS — H10401 Unspecified chronic conjunctivitis, right eye: Secondary | ICD-10-CM

## 2020-08-02 DIAGNOSIS — R609 Edema, unspecified: Secondary | ICD-10-CM

## 2020-08-03 NOTE — Telephone Encounter (Signed)
Requested medication (s) are due for refill today: yes  Requested medication (s) are on the active medication list: yes  Last refill:  07/03/2020  Future visit scheduled: yes  Notes to clinic:  Medication not assigned to a protocol, review manually   Requested Prescriptions  Pending Prescriptions Disp Refills   sulfacetamide (BLEPH-10) 10 % ophthalmic solution [Pharmacy Med Name: SULFACETAMIDE SODIUM 10% EYE SOLN M] 15 mL 5    Sig: PLACE 2 DROPS INTO RIGHT EYE 4 TIMES DAILY      Off-Protocol Failed - 08/02/2020  5:15 PM      Failed - Medication not assigned to a protocol, review manually.      Passed - Valid encounter within last 12 months    Recent Outpatient Visits           1 month ago Neck pain   [facility] [name] [place], [place]   2 months ago Neck pain   [facility] [name], [place]   4 months ago Sinus congestion   [facility] [name], [place]   10 months ago High risk medication use   [facility] [place], [place], [place]   1 year ago Constipation, unspecified constipation type   [facility], Marzella Schlein, MD       Future Appointments             In 2 months Bacigalupo, Marzella Schlein, MD [facility], [facility]              Signed Prescriptions Disp Refills   hydrochlorothiazide (HYDRODIURIL) 25 MG tablet 90 tablet 0    Sig: TAKE 1 TABLET BY MOUTH ONCE DAILY      Cardiovascular: Diuretics - Thiazide Passed - 08/02/2020  5:15 PM      Passed - Ca in normal range and within 360 days    Calcium  Date Value Ref Range Status  05/16/2020 9.4 8.7 - 10.2 mg/dL Final          Passed - Cr in normal range and within 360 days    Creatinine, Ser  Date Value Ref Range Status  05/16/2020 0.72 0.57 - 1.00 mg/dL Final    Comment:                   **Effective May 21, 2020 [facility] will begin**                  reporting the 2021 CKD-EPI creatinine  equation that                  estimates kidney function without a race variable.           Passed - K in normal range and within 360 days    Potassium  Date Value Ref Range Status  05/16/2020 3.9 3.5 - 5.2 mmol/L Final          Passed - Na in normal range and within 360 days    Sodium  Date Value Ref Range Status  05/16/2020 137 134 - 144 mmol/L Final          Passed - Last BP in normal range    BP Readings from Last 1 Encounters:  06/13/20 94/67          Passed - Valid encounter within last 6 months    Recent Outpatient Visits           1 month ago Neck pain   [facility], Alessandra Bevels, PA-C   2 months ago Neck pain   [facility] [name] [place], [place]   4 months ago Sinus congestion   [facility] [name] [place], [place]   10 months ago High risk medication use   [facility] [place], [place], [place]   1 year ago Constipation, unspecified constipation type   [facility], Marzella Schlein, MD       Future Appointments             In 2 months Bacigalupo, Marzella Schlein, MD [facility], [facility]               cloNIDine (CATAPRES) 0.2 MG tablet 90 tablet 0    Sig: TAKE 1 TABLET BY MOUTH AT BEDTIME      Cardiovascular:  Alpha-2 Agonists Passed - 08/02/2020  5:15 PM      Passed - Last BP in normal range    BP Readings from Last 1 Encounters:  06/13/20 94/67          Passed - Last Heart Rate in normal range    Pulse Readings from Last 1 Encounters:  06/13/20 73          Passed - Valid encounter within last 6 months    Recent Outpatient Visits           1 month ago Neck pain   [facility] [place], Alessandra Bevels, [place]   2 months ago Neck pain   [facility] [name] [place], [place]   4 months ago Sinus congestion   [facility] [name] [place], [place]   10 months ago High risk medication use    [facility] [place], [place], [place]   1 year ago Constipation, unspecified constipation type   [facility], Marzella Schlein, MD       Future Appointments             In 2 months Bacigalupo, Marzella Schlein, MD [facility], [facility]

## 2020-08-09 ENCOUNTER — Other Ambulatory Visit: Payer: Self-pay | Admitting: Physician Assistant

## 2020-08-09 DIAGNOSIS — L700 Acne vulgaris: Secondary | ICD-10-CM

## 2020-08-27 ENCOUNTER — Other Ambulatory Visit: Payer: Self-pay | Admitting: Physician Assistant

## 2020-08-29 NOTE — Telephone Encounter (Signed)
Mandy, from [facility], calling stating that the pt is completely out of medication. Please advise.

## 2020-09-21 ENCOUNTER — Other Ambulatory Visit: Payer: Self-pay | Admitting: Family Medicine

## 2020-09-21 DIAGNOSIS — R609 Edema, unspecified: Secondary | ICD-10-CM

## 2020-10-15 ENCOUNTER — Telehealth: Payer: Self-pay

## 2020-10-15 ENCOUNTER — Ambulatory Visit: Payer: Self-pay | Admitting: Family Medicine

## 2020-10-15 ENCOUNTER — Encounter: Payer: Self-pay | Admitting: Family Medicine

## 2020-10-15 ENCOUNTER — Other Ambulatory Visit: Payer: Self-pay

## 2020-10-15 ENCOUNTER — Ambulatory Visit (INDEPENDENT_AMBULATORY_CARE_PROVIDER_SITE_OTHER): Payer: Medicare Other | Admitting: Family Medicine

## 2020-10-15 VITALS — BP 92/64 | HR 72 | Resp 16 | Wt 195.0 lb

## 2020-10-15 DIAGNOSIS — D702 Other drug-induced agranulocytosis: Secondary | ICD-10-CM

## 2020-10-15 DIAGNOSIS — R7889 Finding of other specified substances, not normally found in blood: Secondary | ICD-10-CM

## 2020-10-15 DIAGNOSIS — G40209 Localization-related (focal) (partial) symptomatic epilepsy and epileptic syndromes with complex partial seizures, not intractable, without status epilepticus: Secondary | ICD-10-CM

## 2020-10-15 DIAGNOSIS — F79 Unspecified intellectual disabilities: Secondary | ICD-10-CM

## 2020-10-15 DIAGNOSIS — E7800 Pure hypercholesterolemia, unspecified: Secondary | ICD-10-CM

## 2020-10-15 DIAGNOSIS — F840 Autistic disorder: Secondary | ICD-10-CM | POA: Diagnosis not present

## 2020-10-15 NOTE — Progress Notes (Signed)
Established patient visit   Patient: Joanna Robinson   DOB: 1980/03/31   40 y.o. Female  MRN: [id number] Visit Date: 10/15/2020   Today's healthcare provider: Shirlee Latch, MD   Chief Complaint  Patient presents with   Follow-up   Subjective    HPI  The patient is accompanied by her mother. Her mother states that she is experiencing neck pain that is accompanied by a sore throat.  Neck Pain The patient is experiencing neck pain that is accompanied by a sore throat. Her mother states that these symptoms have been chronic since the beginning of her menstrual cycle at 17. Her mother also states that she only ever experiences the neck pain and sore throat during her menstrual cycle.   Seizure Levels The patients mother reports that her seizure levels have been well maintained since her last visit.  Medications: Outpatient Medications Prior to Visit  Medication Sig   benzoyl peroxide-erythromycin (BENZAMYCIN) gel APPLY TO AFFECTED AREA(s) TWICE DAILY ASDIRECTED   acetaZOLAMIDE (DIAMOX) 250 MG tablet TAKE 1 TABLET BY MOUTH TWICE DAILY   amoxicillin-clavulanate (AUGMENTIN) 875-125 MG tablet Take 1 tablet by mouth 2 (two) times daily.   azelastine (OPTIVAR) 0.05 % ophthalmic solution Place 1 drop into both eyes 2 (two) times daily.   Azelastine-Fluticasone 137-50 MCG/ACT SUSP USE 1 SPRAY INTO EACH NOSTRIL TWICE DAILY   baclofen (LIORESAL) 10 MG tablet Take 1 tablet (10 mg total) by mouth 2 (two) times daily.   brompheniramine-pseudoephedrine-DM 30-2-10 MG/5ML syrup Take 5 mLs by mouth 4 (four) times daily as needed.   CARBATROL 200 MG 12 hr capsule TAKE 2 CAPSULES BY MOUTH ONCE EVERY MORNING AND 1 CAPSULE EVERY EVENING   CARBATROL 300 MG 12 hr capsule TAKE 1 CAPSULE BY MOUTH AT BEDTIME   cloNIDine (CATAPRES) 0.2 MG tablet TAKE 1 TABLET BY MOUTH AT BEDTIME   ferrous gluconate (FERGON) 324 MG tablet TAKE 1 TABLET BY MOUTH ONCE DAILY WITH BREAKFAST   fexofenadine (ALLEGRA) 180 MG  tablet TAKE 1 TABLET BY MOUTH ONCE DAILY   fluvoxaMINE (LUVOX) 100 MG tablet TAKE 1 TABLET BY MOUTH TWICE DAILY   furosemide (LASIX) 20 MG tablet Take 1 tablet by mouth daily. 1/2 a tablet daily   GNP MUCUS ER 600 MG 12 hr tablet TAKE 1 TABLET BY MOUTH TWICE DAILY FOR 15 DAYS   hydrochlorothiazide (HYDRODIURIL) 25 MG tablet TAKE 1 TABLET BY MOUTH ONCE DAILY   hydrocortisone (ANUSOL-HC) 2.5 % rectal cream Place 1 application rectally 2 (two) times daily.   LINZESS 145 MCG CAPS capsule TAKE 1 CAPSULE BY MOUTH ONCE DAILY   MAPAP 500 MG tablet TAKE 2 TABLETS BY MOUTH 3 TIMES DAILY ASNEEDED HEADACHE   montelukast (SINGULAIR) 10 MG tablet TAKE 1 TABLET BY MOUTH ONCE EVERY EVENING   naproxen (NAPROSYN) 500 MG tablet TAKE 1 TABLET BY MOUTH TWICE DAILY   omeprazole (PRILOSEC) 40 MG capsule TAKE 1 CAPSULE BY MOUTH ONCE DAILY   polyethylene glycol powder (GLYCOLAX/MIRALAX) 17 GM/SCOOP powder MIX 17 GRAMS IN 4 TO 8 OZ OF FLUID AND DRINK AT BEDTIME   potassium chloride (KLOR-CON) 20 MEQ packet DISSOLVE 1 PACKET IN 4 OZ OF LIQUID AND DRINK DAILY   sodium fluoride (DENTA 5000 PLUS) 1.1 % CREA dental cream Place 1 application onto teeth every evening. Brush teeth daily for 1 minute, rinse and spit.   sulfacetamide (BLEPH-10) 10 % ophthalmic solution PLACE 2 DROPS INTO RIGHT EYE 4 TIMES DAILY   triamcinolone cream (KENALOG) 0.1 % APPLY TO  AFFECTED AREA(s) TWICE DAILY ASDIRECTED   No facility-administered medications prior to visit.    Review of Systems  Constitutional:  Negative for chills, fatigue and fever.  HENT:  Negative for congestion, ear pain, rhinorrhea, sinus pain and sore throat.   Respiratory:  Negative for cough, shortness of breath and wheezing.   Cardiovascular:  Negative for chest pain and leg swelling.  Gastrointestinal:  Negative for abdominal pain, blood in stool, diarrhea, nausea and vomiting.  Genitourinary:  Negative for dysuria, flank pain, frequency and urgency.  Neurological:   Negative for dizziness and headaches.   @VANISHINGLABLINKS @  Objective    BP 92/64   Pulse 72   Resp 16   Wt 195 lb (88.5 kg)   SpO2 99%   BMI 29.65 kg/m  @VANISHINGVITALSLINKS @  Physical Exam Vitals reviewed.  Constitutional:      General: She is not in acute distress.    Appearance: She is well-developed. She is not diaphoretic.     Comments: sitting in wheelchair  HENT:     Head: Normocephalic and atraumatic.  Eyes:     General: No scleral icterus.    Conjunctiva/sclera: Conjunctivae normal.  Neck:     Thyroid: No thyromegaly.  Cardiovascular:     Rate and Rhythm: Normal rate and regular rhythm.     Pulses: Normal pulses.     Heart sounds: Normal heart sounds. No murmur heard. Pulmonary:     Effort: Pulmonary effort is normal. No respiratory distress.     Breath sounds: Normal breath sounds. No wheezing, rhonchi or rales.  Musculoskeletal:     Cervical back: Neck supple.     Right lower leg: No edema.     Left lower leg: No edema.  Lymphadenopathy:     Cervical: No cervical adenopathy.  Skin:    General: Skin is warm and dry.     Findings: No rash.  Neurological:     Mental Status: She is alert and oriented to person, place, and time. Mental status is at baseline.  Psychiatric:     Comments: Minimally verbal, does not contribute much to HPI      No results found for any visits on 10/15/20.  Assessment & Plan     Problem List Items Addressed This Visit       Nervous and Auditory   Partial epilepsy with impairment of consciousness (HCC)    Followed by Neuro, but previous PCP monitoring tegretol levels Will repeat - if abnormal, will f/u with neuro       Relevant Orders   Carbamazepine Level (Tegretol), total     Other   Active autistic disorder    Chronic and stable Cared for by her mother       Elevated carbamazepine level   Relevant Orders   Carbamazepine Level (Tegretol), total   Neutropenia (HCC)    Previous normal Heme workup Recheck  CBC diff       Relevant Orders   CBC w/Diff/Platelet   HLD (hyperlipidemia) - Primary    Reviewed last lipid panel Not currently on a statin Recheck FLP and CMP Discussed diet and exercise        Relevant Orders   Comprehensive metabolic panel   Lipid panel   Intellectual disability    See above Cared for by her mother         Return in about 4 months (around 02/15/2021) for chronic disease f/u, With new PCP.      10/17/20 [place],acting as a 02/17/2021 for  Shirlee Latch, MD.,have documented all relevant documentation on the behalf of Shirlee Latch, MD,as directed by  Shirlee Latch, MD while in the presence of Shirlee Latch, MD.  I, Shirlee Latch, MD, have reviewed all documentation for this visit. The documentation on 10/15/20 for the exam, diagnosis, procedures, and orders are all accurate and complete.   Amely Voorheis, Marzella Schlein, MD, MPH [facility]

## 2020-10-15 NOTE — Assessment & Plan Note (Signed)
Chronic and stable Cared for by her mother

## 2020-10-15 NOTE — Assessment & Plan Note (Signed)
Followed by Neuro, but previous PCP monitoring tegretol levels Will repeat - if abnormal, will f/u with neuro

## 2020-10-15 NOTE — Progress Notes (Deleted)
Established patient visit   Patient: Joanna Robinson   DOB: [birth year]   40 y.o. Female  MRN: [id number] Visit Date: 10/15/2020  Today's healthcare provider: Shirlee Latch, MD   No chief complaint on file.  Subjective    HPI  Neck Pain, follow up  Patient is a 40 year old female who presents today for follow up of neck pain.  She was last seen 4 months ago.  At that time x-ray was obtained and revealed: IMPRESSION: 1. No acute displaced fracture or significant degenerative changes. 2. Straightening of the normal cervical lordotic curvature may be secondary to positioning or muscle spasm. Patient had been instructed to use Ibuprofen or Aleve for pain.      {Show patient history (optional):23778}   Medications: Outpatient Medications Prior to Visit  Medication Sig   benzoyl peroxide-erythromycin (BENZAMYCIN) gel APPLY TO AFFECTED AREA(s) TWICE DAILY ASDIRECTED   acetaZOLAMIDE (DIAMOX) 250 MG tablet TAKE 1 TABLET BY MOUTH TWICE DAILY   amoxicillin-clavulanate (AUGMENTIN) 875-125 MG tablet Take 1 tablet by mouth 2 (two) times daily.   azelastine (OPTIVAR) 0.05 % ophthalmic solution Place 1 drop into both eyes 2 (two) times daily.   Azelastine-Fluticasone 137-50 MCG/ACT SUSP USE 1 SPRAY INTO EACH NOSTRIL TWICE DAILY   baclofen (LIORESAL) 10 MG tablet Take 1 tablet (10 mg total) by mouth 2 (two) times daily.   brompheniramine-pseudoephedrine-DM 30-2-10 MG/5ML syrup Take 5 mLs by mouth 4 (four) times daily as needed.   CARBATROL 200 MG 12 hr capsule TAKE 2 CAPSULES BY MOUTH ONCE EVERY MORNING AND 1 CAPSULE EVERY EVENING   CARBATROL 300 MG 12 hr capsule TAKE 1 CAPSULE BY MOUTH AT BEDTIME   cloNIDine (CATAPRES) 0.2 MG tablet TAKE 1 TABLET BY MOUTH AT BEDTIME   ferrous gluconate (FERGON) 324 MG tablet TAKE 1 TABLET BY MOUTH ONCE DAILY WITH BREAKFAST   fexofenadine (ALLEGRA) 180 MG tablet TAKE 1 TABLET BY MOUTH ONCE DAILY   fluvoxaMINE (LUVOX) 100 MG tablet TAKE 1 TABLET BY  MOUTH TWICE DAILY   furosemide (LASIX) 20 MG tablet Take 1 tablet by mouth daily. 1/2 a tablet daily   GNP MUCUS ER 600 MG 12 hr tablet TAKE 1 TABLET BY MOUTH TWICE DAILY FOR 15 DAYS   hydrochlorothiazide (HYDRODIURIL) 25 MG tablet TAKE 1 TABLET BY MOUTH ONCE DAILY   hydrocortisone (ANUSOL-HC) 2.5 % rectal cream Place 1 application rectally 2 (two) times daily.   LINZESS 145 MCG CAPS capsule TAKE 1 CAPSULE BY MOUTH ONCE DAILY   MAPAP 500 MG tablet TAKE 2 TABLETS BY MOUTH 3 TIMES DAILY ASNEEDED HEADACHE   montelukast (SINGULAIR) 10 MG tablet TAKE 1 TABLET BY MOUTH ONCE EVERY EVENING   naproxen (NAPROSYN) 500 MG tablet TAKE 1 TABLET BY MOUTH TWICE DAILY   omeprazole (PRILOSEC) 40 MG capsule TAKE 1 CAPSULE BY MOUTH ONCE DAILY   polyethylene glycol powder (GLYCOLAX/MIRALAX) 17 GM/SCOOP powder MIX 17 GRAMS IN 4 TO 8 OZ OF FLUID AND DRINK AT BEDTIME   potassium chloride (KLOR-CON) 20 MEQ packet DISSOLVE 1 PACKET IN 4 OZ OF LIQUID AND DRINK DAILY   sodium fluoride (DENTA 5000 PLUS) 1.1 % CREA dental cream Place 1 application onto teeth every evening. Brush teeth daily for 1 minute, rinse and spit.   sulfacetamide (BLEPH-10) 10 % ophthalmic solution PLACE 2 DROPS INTO RIGHT EYE 4 TIMES DAILY   triamcinolone cream (KENALOG) 0.1 % APPLY TO AFFECTED AREA(s) TWICE DAILY ASDIRECTED   No facility-administered medications prior to visit.  Review of Systems  {Labs  Heme  Chem  Endocrine  Serology  Results Review (optional):23779}   Objective    There were no vitals taken for this visit. {Show previous vital signs (optional):23777}   Physical Exam  ***  No results found for any visits on 10/15/20.  Assessment & Plan     ***  No follow-ups on file.      {provider attestation***:1}   Shirlee Latch, MD  [facility] [phone] (phone) [phone] (fax)  [facility]

## 2020-10-15 NOTE — Assessment & Plan Note (Signed)
Reviewed last lipid panel Not currently on a statin Recheck FLP and CMP Discussed diet and exercise  

## 2020-10-15 NOTE — Assessment & Plan Note (Signed)
See above Cared for by her mother

## 2020-10-15 NOTE — Assessment & Plan Note (Signed)
Previous normal Heme workup Recheck CBC diff

## 2020-10-15 NOTE — Telephone Encounter (Signed)
Copied from CRM [id number]. Topic: General - Other >> Oct 15, 2020 10:28 AM Leafy Ro wrote: Reason for CRM: Pt mom is calling and said the office is familiar with Selin and she is moving slow and will callback to rsc if needed

## 2020-10-16 ENCOUNTER — Telehealth (INDEPENDENT_AMBULATORY_CARE_PROVIDER_SITE_OTHER): Payer: Self-pay | Admitting: Family

## 2020-10-16 DIAGNOSIS — G40209 Localization-related (focal) (partial) symptomatic epilepsy and epileptic syndromes with complex partial seizures, not intractable, without status epilepticus: Secondary | ICD-10-CM

## 2020-10-16 LAB — CARBAMAZEPINE LEVEL, TOTAL: Carbamazepine (Tegretol), S: 17.4 ug/mL (ref 4.0–12.0)

## 2020-10-16 LAB — COMPREHENSIVE METABOLIC PANEL
ALT: 17 IU/L (ref 0–32)
AST: 18 IU/L (ref 0–40)
Albumin/Globulin Ratio: 1.7 (ref 1.2–2.2)
Albumin: 4.3 g/dL (ref 3.8–4.8)
Alkaline Phosphatase: 103 IU/L (ref 44–121)
BUN/Creatinine Ratio: 16 (ref 9–23)
BUN: 11 mg/dL (ref 6–20)
Bilirubin Total: <0.2 mg/dL (ref 0.0–1.2)
CO2: 21 mmol/L (ref 20–29)
Calcium: 9.6 mg/dL (ref 8.7–10.2)
Chloride: 103 mmol/L (ref 96–106)
Creatinine, Ser: 0.67 mg/dL (ref 0.57–1.00)
Globulin, Total: 2.6 g/dL (ref 1.5–4.5)
Glucose: 91 mg/dL (ref 65–99)
Potassium: 4.1 mmol/L (ref 3.5–5.2)
Sodium: 134 mmol/L (ref 134–144)
Total Protein: 6.9 g/dL (ref 6.0–8.5)
eGFR: 115 mL/min/{1.73_m2} (ref >59)

## 2020-10-16 LAB — CBC WITH DIFFERENTIAL/PLATELET
Basophils Absolute: 0 10*3/uL (ref 0.0–0.2)
Basos: 0 %
EOS (ABSOLUTE): 0 10*3/uL (ref 0.0–0.4)
Eos: 1 %
Hematocrit: 41.5 % (ref 34.0–46.6)
Hemoglobin: 13.2 g/dL (ref 11.1–15.9)
Immature Grans (Abs): 0 10*3/uL (ref 0.0–0.1)
Immature Granulocytes: 0 %
Lymphocytes Absolute: 0.8 10*3/uL (ref 0.7–3.1)
Lymphs: 29 %
MCH: 30.1 pg (ref 26.6–33.0)
MCHC: 31.8 g/dL (ref 31.5–35.7)
MCV: 95 fL (ref 79–97)
Monocytes Absolute: 0.3 10*3/uL (ref 0.1–0.9)
Monocytes: 11 %
Neutrophils Absolute: 1.6 10*3/uL (ref 1.4–7.0)
Neutrophils: 59 %
Platelets: 209 10*3/uL (ref 150–450)
RBC: 4.39 x10E6/uL (ref 3.77–5.28)
RDW: 11.8 % (ref 11.7–15.4)
WBC: 2.8 10*3/uL — ABNORMAL LOW (ref 3.4–10.8)

## 2020-10-16 LAB — LIPID PANEL
Chol/HDL Ratio: 3.1 ratio (ref 0.0–4.4)
Cholesterol, Total: 247 mg/dL — ABNORMAL HIGH (ref 100–199)
HDL: 79 mg/dL (ref >39)
LDL Chol Calc (NIH): 161 mg/dL — ABNORMAL HIGH (ref 0–99)
Triglycerides: 47 mg/dL (ref 0–149)
VLDL Cholesterol Cal: 7 mg/dL (ref 5–40)

## 2020-10-16 MED ORDER — CARBATROL 200 MG PO CP12: ORAL_CAPSULE | ORAL | 5 refills | Status: DC

## 2020-10-16 NOTE — Telephone Encounter (Signed)
I spoke with mother and we will drop Carbatrol from 900 mg a day to 800 mg a day.  She will put the 300 mg tablet inside and will take 2 200 mg capsules in the morning and 2 at nighttime.  I will write a new prescription.  I will also send this to [facility] for her review.

## 2020-10-16 NOTE — Telephone Encounter (Signed)
  Who's calling (name and relationship to patient) :Mom/ [name]   Best contact number:[phone]  Provider they EZ[facility]   Reason for call:mom called requesting a call back regarding Joanna Robinson's Tegretol level that was 17.4 and was told by her primary doctor that she needed to call and let Inetta Fermo know about this.      PRESCRIPTION REFILL ONLY  Name of prescription:  Pharmacy:

## 2020-10-22 ENCOUNTER — Other Ambulatory Visit: Payer: Self-pay | Admitting: Family Medicine

## 2020-10-22 NOTE — Telephone Encounter (Signed)
Requested Prescriptions  Pending Prescriptions Disp Refills  . LINZESS 145 MCG CAPS capsule [Pharmacy Med Name: LINZESS 145 MCG CAP] 90 capsule 0    Sig: TAKE 1 CAPSULE BY MOUTH ONCE DAILY     Gastroenterology: Irritable Bowel Syndrome Passed - 10/22/2020  4:24 PM      Passed - Valid encounter within last 12 months    Recent Outpatient Visits          1 week ago Pure hypercholesterolemia   [facility] [place], Marzella Schlein, MD   4 months ago Neck pain   [facility] [name] [place], [place]   5 months ago Neck pain   [facility] [name] [place], [place]   7 months ago Sinus congestion   [facility] [place], [place], [place]   1 year ago High risk medication use   [facility] [place], Alessandra Bevel[place], [place]      Future Appointments            In 3 months Jacky Kindle, FNP [facility], [facility]

## 2020-10-23 NOTE — Telephone Encounter (Signed)
I called and spoke with Mom. She said that she received the bubble packs of medication from the pharmacy and that it contained the Carbatrol 200mg  and Carbatrol 300mg  capsules. I verified with her that the dose should be Carbatrol 200mg  - 2 BID and not to give the 300mg  capsules. I attempted to call [facility] to update directions and the line was busy. I will call back again later today. I updated the medication list. TG

## 2020-10-23 NOTE — Telephone Encounter (Signed)
I called [facility] and told them to d/c the Rx on file for Carbatrol 300mg . TG

## 2020-10-23 NOTE — Telephone Encounter (Signed)
Mom )[name]) called back and states that medication was delivered yesterday but she wants to confirm dosage before she gives medication to patient. She requests call back from [facility] at [phone].

## 2020-10-23 NOTE — Addendum Note (Signed)
Addended by: Princella Ion on: 10/23/2020 03:00 PM   Modules accepted: Orders

## 2020-11-01 ENCOUNTER — Other Ambulatory Visit: Payer: Self-pay | Admitting: Physician Assistant

## 2020-11-01 DIAGNOSIS — E876 Hypokalemia: Secondary | ICD-10-CM

## 2020-11-18 ENCOUNTER — Other Ambulatory Visit: Payer: Self-pay | Admitting: Family Medicine

## 2020-11-18 ENCOUNTER — Other Ambulatory Visit: Payer: Self-pay | Admitting: Physician Assistant

## 2020-11-18 DIAGNOSIS — J302 Other seasonal allergic rhinitis: Secondary | ICD-10-CM

## 2020-11-18 DIAGNOSIS — D508 Other iron deficiency anemias: Secondary | ICD-10-CM

## 2020-11-18 DIAGNOSIS — G479 Sleep disorder, unspecified: Secondary | ICD-10-CM

## 2020-11-18 DIAGNOSIS — R609 Edema, unspecified: Secondary | ICD-10-CM

## 2020-11-18 NOTE — Telephone Encounter (Signed)
Requested Prescriptions  Pending Prescriptions Disp Refills  . hydrochlorothiazide (HYDRODIURIL) 25 MG tablet [Pharmacy Med Name: HYDROCHLOROTHIAZIDE 25 MG TAB] 90 tablet     Sig: TAKE 1 TABLET BY MOUTH ONCE DAILY     Cardiovascular: Diuretics - Thiazide Passed - 11/18/2020  4:07 PM      Passed - Ca in normal range and within 360 days    Calcium  Date Value Ref Range Status  10/15/2020 9.6 8.7 - 10.2 mg/dL Final         Passed - Cr in normal range and within 360 days    Creatinine, Ser  Date Value Ref Range Status  10/15/2020 0.67 0.57 - 1.00 mg/dL Final         Passed - K in normal range and within 360 days    Potassium  Date Value Ref Range Status  10/15/2020 4.1 3.5 - 5.2 mmol/L Final         Passed - Na in normal range and within 360 days    Sodium  Date Value Ref Range Status  10/15/2020 134 134 - 144 mmol/L Final         Passed - Last BP in normal range    BP Readings from Last 1 Encounters:  10/15/20 92/64         Passed - Valid encounter within last 6 months    Recent Outpatient Visits          1 month ago Pure hypercholesterolemia   [facility] [place], Marzella Schlein, [place]   5 months ago Neck pain   [facility] [name] [place], [place]   6 months ago Neck pain   [facility] [name] [place], [place]   8 months ago Sinus congestion   [facility] [place], [place], [place]   1 year ago High risk medication use   [facility], [place], [place]      Future Appointments            In 3 months Jacky Kindle, FNP [facility], [facility]           . cloNIDine (CATAPRES) 0.2 MG tablet [Pharmacy Med Name: CLONIDINE HCL 0.2 MG TAB] 90 tablet 0    Sig: TAKE 1 TABLET BY MOUTH AT BEDTIME     Cardiovascular:  Alpha-2 Agonists Passed - 11/18/2020  4:07 PM      Passed - Last BP in normal range    BP Readings from Last 1 Encounters:  10/15/20 92/64         Passed - Last  Heart Rate in normal range    Pulse Readings from Last 1 Encounters:  10/15/20 72         Passed - Valid encounter within last 6 months    Recent Outpatient Visits          1 month ago Pure hypercholesterolemia   [facility] [place], Marzella Schlein, [place]   5 months ago Neck pain   [facility] [name] [place], [place]   6 months ago Neck pain   [facility] [name] [place], [place]   8 months ago Sinus congestion   [facility] [place], [place], [place]   1 year ago High risk medication use   [facility] [place], Alessandra Bevels, [place]      Future Appointments            In 3 months Jacky Kindle, FNP [facility], [facility]

## 2020-12-15 ENCOUNTER — Telehealth (INDEPENDENT_AMBULATORY_CARE_PROVIDER_SITE_OTHER): Payer: Self-pay | Admitting: Family

## 2020-12-15 ENCOUNTER — Other Ambulatory Visit: Payer: Self-pay | Admitting: Family Medicine

## 2020-12-15 ENCOUNTER — Other Ambulatory Visit: Payer: Self-pay | Admitting: Physician Assistant

## 2020-12-15 DIAGNOSIS — R609 Edema, unspecified: Secondary | ICD-10-CM

## 2020-12-15 DIAGNOSIS — K219 Gastro-esophageal reflux disease without esophagitis: Secondary | ICD-10-CM

## 2020-12-15 DIAGNOSIS — M62838 Other muscle spasm: Secondary | ICD-10-CM

## 2020-12-15 DIAGNOSIS — G479 Sleep disorder, unspecified: Secondary | ICD-10-CM

## 2020-12-15 DIAGNOSIS — N946 Dysmenorrhea, unspecified: Secondary | ICD-10-CM

## 2020-12-15 DIAGNOSIS — G40209 Localization-related (focal) (partial) symptomatic epilepsy and epileptic syndromes with complex partial seizures, not intractable, without status epilepticus: Secondary | ICD-10-CM

## 2020-12-15 NOTE — Telephone Encounter (Signed)
Medication has been requested too early. Patient should have enough medication until next OV on 02/18/21.Last ordered on 11/18/20 #90 caps no refills-pharmacy receipt confirmed.  Future OV 02/18/21 Refused due to requested too soon.

## 2020-12-15 NOTE — Telephone Encounter (Signed)
Future OV 02/18/21  Approved per protocol.

## 2020-12-15 NOTE — Telephone Encounter (Signed)
Future OV 02/18/21  Approved per protocol. 

## 2020-12-17 ENCOUNTER — Encounter (INDEPENDENT_AMBULATORY_CARE_PROVIDER_SITE_OTHER): Payer: Self-pay | Admitting: Family

## 2020-12-17 NOTE — Telephone Encounter (Signed)
Message sent to the front desk to schedule an appointment with Inetta Fermo

## 2021-01-01 ENCOUNTER — Ambulatory Visit (INDEPENDENT_AMBULATORY_CARE_PROVIDER_SITE_OTHER): Payer: Medicare Other | Admitting: Family

## 2021-01-03 ENCOUNTER — Ambulatory Visit: Payer: Medicare Other | Admitting: Family Medicine

## 2021-01-04 ENCOUNTER — Other Ambulatory Visit: Payer: Self-pay | Admitting: Family Medicine

## 2021-01-04 ENCOUNTER — Other Ambulatory Visit: Payer: Self-pay | Admitting: Physician Assistant

## 2021-01-04 DIAGNOSIS — H1013 Acute atopic conjunctivitis, bilateral: Secondary | ICD-10-CM

## 2021-01-04 DIAGNOSIS — G479 Sleep disorder, unspecified: Secondary | ICD-10-CM

## 2021-01-11 ENCOUNTER — Other Ambulatory Visit: Payer: Self-pay | Admitting: Family Medicine

## 2021-01-11 DIAGNOSIS — L700 Acne vulgaris: Secondary | ICD-10-CM

## 2021-01-11 DIAGNOSIS — H10401 Unspecified chronic conjunctivitis, right eye: Secondary | ICD-10-CM

## 2021-01-12 NOTE — Telephone Encounter (Signed)
Requested Prescriptions  Pending Prescriptions Disp Refills  . benzoyl peroxide-erythromycin (BENZAMYCIN) gel [Pharmacy Med Name: BENZOYL PEROXIDE-ERYTHROMYCIN 5-3%] 46.6 g 5    Sig: APPLY TO AFFECTED AREA(s) TWICE DAILY ASDIRECTED     Dermatology:  Acne preparations Passed - 01/11/2021  1:46 PM      Passed - Valid encounter within last 12 months    Recent Outpatient Visits          2 months ago Pure hypercholesterolemia   [facility] [place], Marzella Schlein, MD   7 months ago Neck pain   [facility] [name] [place], [place]   8 months ago Neck pain   [facility] [name] [place], [place]   10 months ago Sinus congestion   [facility] [place], [place], [place]   1 year ago High risk medication use   [facility] [place], Alessandra Bevels, [place]      Future Appointments            In 1 week Jacky Kindle, FNP [facility], [facility]

## 2021-01-12 NOTE — Telephone Encounter (Signed)
Requested medications are due for refill today yes  Requested medications are on the active medication list yes  Last refill 12/18/20  Last visit Do not see this addressed in OV note  Future visit scheduled 01/25/21  Notes to clinic this med does not have a protocol to review, please assess.  Requested Prescriptions  Pending Prescriptions Disp Refills   sulfacetamide (BLEPH-10) 10 % ophthalmic solution [Pharmacy Med Name: SULFACETAMIDE SODIUM 10% EYE SOLN M] 15 mL 5    Sig: PLACE 2 DROPS INTO RIGHT EYE 4 TIMES DAILY     Off-Protocol Failed - 01/11/2021  1:49 PM      Failed - Medication not assigned to a protocol, review manually.      Passed - Valid encounter within last 12 months    Recent Outpatient Visits           2 months ago Pure hypercholesterolemia   [facility] [place], Marzella Schlein, [place]   7 months ago Neck pain   [facility] [name] M, [place]   8 months ago Neck pain   [facility] [name] M, [place]   10 months ago Sinus congestion   [facility] [place], [place], [place]   1 year ago High risk medication use   [facility] [place], Alessandra Bevels, [place]       Future Appointments             In 1 week Jacky Kindle, FNP [facility], [facility]

## 2021-01-22 ENCOUNTER — Ambulatory Visit (INDEPENDENT_AMBULATORY_CARE_PROVIDER_SITE_OTHER): Payer: Medicare Other | Admitting: Family

## 2021-01-25 ENCOUNTER — Ambulatory Visit: Payer: Medicare Other | Admitting: Family Medicine

## 2021-01-31 ENCOUNTER — Ambulatory Visit: Payer: Medicare Other | Admitting: Physician Assistant

## 2021-01-31 ENCOUNTER — Ambulatory Visit (INDEPENDENT_AMBULATORY_CARE_PROVIDER_SITE_OTHER): Payer: Medicare Other | Admitting: Physician Assistant

## 2021-01-31 ENCOUNTER — Other Ambulatory Visit: Payer: Self-pay

## 2021-01-31 ENCOUNTER — Encounter: Payer: Self-pay | Admitting: Physician Assistant

## 2021-01-31 VITALS — BP 96/68 | HR 75 | Ht 68.0 in | Wt 185.0 lb

## 2021-01-31 DIAGNOSIS — Z23 Encounter for immunization: Secondary | ICD-10-CM | POA: Diagnosis not present

## 2021-01-31 DIAGNOSIS — R7889 Finding of other specified substances, not normally found in blood: Secondary | ICD-10-CM | POA: Diagnosis not present

## 2021-01-31 DIAGNOSIS — E7800 Pure hypercholesterolemia, unspecified: Secondary | ICD-10-CM

## 2021-01-31 DIAGNOSIS — J3089 Other allergic rhinitis: Secondary | ICD-10-CM | POA: Diagnosis not present

## 2021-01-31 NOTE — Assessment & Plan Note (Signed)
Will recheck today managed by neurology, appointment next week

## 2021-01-31 NOTE — Assessment & Plan Note (Signed)
Advised increase flonase-azelastine inhaler to BID Continue with allegra

## 2021-01-31 NOTE — Progress Notes (Signed)
Established patient visit   Patient: Joanna Robinson   DOB: March 25, 1980   40 y.o. Female  MRN: [id number] Visit Date: 01/31/2021  Today's healthcare provider: [name], PA-C   Cc. Follow up HLD  Subjective    HPI  Lipid/Cholesterol, Follow-up  Last lipid panel Other pertinent labs  Lab Results  Component Value Date   CHOL 247 (H) 10/15/2020   HDL 79 10/15/2020   LDLCALC 161 (H) 10/15/2020   TRIG 47 10/15/2020   CHOLHDL 3.1 10/15/2020   Lab Results  Component Value Date   ALT 17 10/15/2020   AST 18 10/15/2020   PLT 209 10/15/2020   TSH 1.990 12/30/2019     She was last seen for this 3 months ago.  Management since that visit includes not currently on medications. Recommendations were for diet and exercise, difficult to change due to autism diagnosis.    Symptoms: No chest pain No chest pressure/discomfort  No dyspnea No lower extremity edema  No numbness or tingling of extremity No orthopnea  No palpitations No paroxysmal nocturnal dyspnea  No speech difficulty No syncope   Current diet: well balanced Current exercise: none   ---------------------------------------------------------------------------------------------------  [name] and mother also reports increase in sinus headaches, she suffers from allergies, takes allegra and a flonase-azelastine nasal spray. She takes tylenol for pain which does work. Denies cough, chest congestion, SOB, fevers, chills.  History of high tegretol level, pt family request re-check level for neurologist.  Medications: Outpatient Medications Prior to Visit  Medication Sig   acetaZOLAMIDE (DIAMOX) 250 MG tablet TAKE 1 TABLET BY MOUTH TWICE DAILY   amoxicillin-clavulanate (AUGMENTIN) 875-125 MG tablet Take 1 tablet by mouth 2 (two) times daily.   azelastine (OPTIVAR) 0.05 % ophthalmic solution PLACE 1 DROP IN BOTH EYES TWICE DAILY   Azelastine-Fluticasone 137-50 MCG/ACT SUSP USE 1 SPRAY INTO EACH NOSTRIL TWICE  DAILY   baclofen (LIORESAL) 10 MG tablet TAKE 1 TABLET BY MOUTH TWICE DAILY   benzoyl peroxide-erythromycin (BENZAMYCIN) gel APPLY TO AFFECTED AREA(s) TWICE DAILY ASDIRECTED   brompheniramine-pseudoephedrine-DM 30-2-10 MG/5ML syrup Take 5 mLs by mouth 4 (four) times daily as needed.   CARBATROL 200 MG 12 hr capsule TAKE 2 CAPSULES BY MOUTH ONCE EVERY MORNING AND 2 CAPSULES EVERY EVENING   cloNIDine (CATAPRES) 0.2 MG tablet TAKE 1 TABLET BY MOUTH AT BEDTIME   ferrous gluconate (FERGON) 324 MG tablet TAKE 1 TABLET BY MOUTH TWICE DAILY WITH MEALS   fexofenadine (ALLEGRA) 180 MG tablet TAKE 1 TABLET BY MOUTH ONCE DAILY   fluvoxaMINE (LUVOX) 100 MG tablet TAKE 1 TABLET BY MOUTH TWICE DAILY   furosemide (LASIX) 20 MG tablet Take 1 tablet by mouth daily. 1/2 a tablet daily   GNP MUCUS ER 600 MG 12 hr tablet TAKE 1 TABLET BY MOUTH TWICE DAILY FOR 15 DAYS   hydrochlorothiazide (HYDRODIURIL) 25 MG tablet TAKE 1 TABLET BY MOUTH ONCE DAILY   hydrocortisone (ANUSOL-HC) 2.5 % rectal cream Place 1 application rectally 2 (two) times daily.   LINZESS 145 MCG CAPS capsule TAKE 1 CAPSULE BY MOUTH ONCE DAILY   MAPAP 500 MG tablet TAKE 2 TABLETS BY MOUTH 3 TIMES DAILY ASNEEDED HEADACHE   montelukast (SINGULAIR) 10 MG tablet TAKE 1 TABLET BY MOUTH ONCE EVERY EVENING   naproxen (NAPROSYN) 500 MG tablet TAKE 1 TABLET BY MOUTH TWICE DAILY   omeprazole (PRILOSEC) 40 MG capsule TAKE 1 CAPSULE BY MOUTH ONCE DAILY   polyethylene glycol powder (GLYCOLAX/MIRALAX) 17 GM/SCOOP powder MIX  17 GRAMS IN 4 TO 8 OZ OF FLUID AND DRINK AT BEDTIME   potassium chloride (KLOR-CON) 20 MEQ packet DISSOLVE 1 PACKET IN 4 OZ OF LIQUID AND DRINK DAILY   sodium fluoride (DENTA 5000 PLUS) 1.1 % CREA dental cream Place 1 application onto teeth every evening. Brush teeth daily for 1 minute, rinse and spit.   sulfacetamide (BLEPH-10) 10 % ophthalmic solution PLACE 2 DROPS INTO RIGHT EYE 4 TIMES DAILY   triamcinolone cream (KENALOG) 0.1 % APPLY TO  AFFECTED AREA(s) TWICE DAILY ASDIRECTED   No facility-administered medications prior to visit.    Review of Systems  Constitutional:  Negative for fatigue and fever.  HENT:  Positive for sinus pressure and sore throat.   Respiratory:  Negative for choking and shortness of breath.   All other systems reviewed and are negative.     Objective    Blood pressure 96/68, pulse 75, height 5\' 8"  (1.727 m), weight 185 lb (83.9 kg), SpO2 (!) 86 %. *hands were cold, no respiratory symptoms and historically 100%  Physical Exam Constitutional:      General: She is not in acute distress.    Appearance: Normal appearance. She is not ill-appearing.  HENT:     Head: Normocephalic.     Right Ear: Tympanic membrane normal.     Left Ear: Tympanic membrane normal.     Nose: Nose normal. No congestion or rhinorrhea.     Mouth/Throat:     Mouth: Mucous membranes are dry.     Pharynx: Posterior oropharyngeal erythema present.  Pulmonary:     Effort: Pulmonary effort is normal.  Neurological:     Mental Status: She is alert and oriented to person, place, and time.     No results found for any visits on 01/31/21.  Assessment & Plan     Problem List Items Addressed This Visit       Respiratory   Allergic rhinitis    Advised increase flonase-azelastine inhaler to BID Continue with allegra        Other   Elevated carbamazepine level - Primary    Will recheck today managed by neurology, appointment next week      Relevant Orders   Carbamazepine, Free and Total   HLD (hyperlipidemia)    Will recheck today, cmp and lipids At 40 y/o we can have follow up to discuss better diet and lifestyle, exercise, activity plan      Relevant Orders   Lipid Profile   Comprehensive Metabolic Panel (CMET)    Return in about 6 weeks (around 03/14/2021) for hyperlipidemia, pending labs.      I, 03/16/2021, PA-C have reviewed all documentation for this visit. The documentation on  01/31/2021 for  the exam, diagnosis, procedures, and orders are all accurate and complete.    13/12/2020, PA-C  [facility] [phone] (phone) [phone] (fax)  [facility]

## 2021-01-31 NOTE — Assessment & Plan Note (Signed)
Will recheck today, cmp and lipids At 40 y/o we can have follow up to discuss better diet and lifestyle, exercise, activity plan

## 2021-02-01 ENCOUNTER — Telehealth (INDEPENDENT_AMBULATORY_CARE_PROVIDER_SITE_OTHER): Payer: Self-pay | Admitting: Family

## 2021-02-01 LAB — COMPREHENSIVE METABOLIC PANEL
ALT: 10 IU/L (ref 0–32)
AST: 15 IU/L (ref 0–40)
Albumin/Globulin Ratio: 1.7 (ref 1.2–2.2)
Albumin: 4.3 g/dL (ref 3.8–4.8)
Alkaline Phosphatase: 118 IU/L (ref 44–121)
BUN/Creatinine Ratio: 11 (ref 9–23)
BUN: 8 mg/dL (ref 6–20)
Bilirubin Total: <0.2 mg/dL (ref 0.0–1.2)
CO2: 24 mmol/L (ref 20–29)
Calcium: 9.4 mg/dL (ref 8.7–10.2)
Chloride: 101 mmol/L (ref 96–106)
Creatinine, Ser: 0.73 mg/dL (ref 0.57–1.00)
Globulin, Total: 2.5 g/dL (ref 1.5–4.5)
Glucose: 95 mg/dL (ref 70–99)
Potassium: 3.9 mmol/L (ref 3.5–5.2)
Sodium: 136 mmol/L (ref 134–144)
Total Protein: 6.8 g/dL (ref 6.0–8.5)
eGFR: 108 mL/min/{1.73_m2} (ref >59)

## 2021-02-01 LAB — LIPID PANEL
Chol/HDL Ratio: 2.8 ratio (ref 0.0–4.4)
Cholesterol, Total: 256 mg/dL — ABNORMAL HIGH (ref 100–199)
HDL: 91 mg/dL (ref >39)
LDL Chol Calc (NIH): 157 mg/dL — ABNORMAL HIGH (ref 0–99)
Triglycerides: 53 mg/dL (ref 0–149)
VLDL Cholesterol Cal: 8 mg/dL (ref 5–40)

## 2021-02-01 LAB — CARBAMAZEPINE, FREE AND TOTAL
Carbamazepine, Free: 4.5 ug/mL (ref 0.6–4.2)
Carbamazepine, Total: 16.6 ug/mL (ref 4.0–12.0)

## 2021-02-01 NOTE — Telephone Encounter (Signed)
A virtual visit is fine with me. Please let Mom know. I have changed it in Epic.  The Carbamazepine level is elevated but typical for [place].  Thanks, Inetta Fermo

## 2021-02-01 NOTE — Telephone Encounter (Signed)
Left message per Tina's note

## 2021-02-01 NOTE — Telephone Encounter (Signed)
Per care everywhere Carbamazepine level is 16.6. information routed to Elveria Rising NP

## 2021-02-01 NOTE — Telephone Encounter (Signed)
  Who's calling (name and relationship to patient) : Sharion Dove   Best contact [phone]  Provider they SKA:[id number]   Reason for call: [facility] is sending labs over. They stated that her  level is elevated. Mom is requested a virtual visit for her upcoming appointment however if b/c her level is elevated would she need to come in. ( Not sure of what level she is talking about) advise mom will have medical assistant to call      PRESCRIPTION REFILL ONLY  Name of prescription:  Pharmacy:

## 2021-02-04 ENCOUNTER — Telehealth (INDEPENDENT_AMBULATORY_CARE_PROVIDER_SITE_OTHER): Payer: Medicare Other | Admitting: Family

## 2021-02-06 ENCOUNTER — Telehealth (INDEPENDENT_AMBULATORY_CARE_PROVIDER_SITE_OTHER): Payer: Medicare Other | Admitting: Family

## 2021-02-11 ENCOUNTER — Other Ambulatory Visit: Payer: Self-pay | Admitting: Family Medicine

## 2021-02-11 DIAGNOSIS — R609 Edema, unspecified: Secondary | ICD-10-CM

## 2021-02-12 ENCOUNTER — Telehealth (INDEPENDENT_AMBULATORY_CARE_PROVIDER_SITE_OTHER): Payer: Medicare Other | Admitting: Family

## 2021-02-18 ENCOUNTER — Ambulatory Visit: Payer: Self-pay | Admitting: Family Medicine

## 2021-03-06 ENCOUNTER — Other Ambulatory Visit: Payer: Self-pay | Admitting: Family Medicine

## 2021-03-06 DIAGNOSIS — G479 Sleep disorder, unspecified: Secondary | ICD-10-CM

## 2021-03-06 DIAGNOSIS — J302 Other seasonal allergic rhinitis: Secondary | ICD-10-CM

## 2021-03-11 ENCOUNTER — Other Ambulatory Visit: Payer: Self-pay | Admitting: Family Medicine

## 2021-03-12 NOTE — Telephone Encounter (Signed)
Requested Prescriptions  Pending Prescriptions Disp Refills   fluvoxaMINE (LUVOX) 100 MG tablet [Pharmacy Med Name: FLUVOXAMINE MALEATE 100 MG TAB] 180 tablet 0    Sig: TAKE 1 TABLET BY MOUTH TWICE DAILY     Psychiatry:  Antidepressants - SSRI Passed - 03/11/2021  1:20 PM      Passed - Valid encounter within last 6 months    Recent Outpatient Visits          1 month ago Elevated carbamazepine level   [facility] [place], Dike, PA-C   4 months ago Pure hypercholesterolemia   [facility] [place], Marzella Schlein, MD   9 months ago Neck pain   [facility] [name] M, [place]   10 months ago Neck pain   [facility] [place], [place], [place]   1 year ago Sinus congestion   [facility] [place], Alessandra Bevel[place], [place]      Future Appointments            In 2 weeks Joanna Rising, NP [facility]

## 2021-03-20 NOTE — Addendum Note (Signed)
Addended by: Lily Kocher on: 03/20/2021 04:01 PM   Modules accepted: Orders

## 2021-04-01 ENCOUNTER — Encounter (INDEPENDENT_AMBULATORY_CARE_PROVIDER_SITE_OTHER): Payer: Self-pay | Admitting: Family

## 2021-04-01 ENCOUNTER — Other Ambulatory Visit: Payer: Self-pay

## 2021-04-01 ENCOUNTER — Telehealth (INDEPENDENT_AMBULATORY_CARE_PROVIDER_SITE_OTHER): Payer: Medicare Other | Admitting: Family

## 2021-04-01 VITALS — Wt 189.0 lb

## 2021-04-01 DIAGNOSIS — G40209 Localization-related (focal) (partial) symptomatic epilepsy and epileptic syndromes with complex partial seizures, not intractable, without status epilepticus: Secondary | ICD-10-CM

## 2021-04-01 DIAGNOSIS — Z79899 Other long term (current) drug therapy: Secondary | ICD-10-CM | POA: Diagnosis not present

## 2021-04-01 DIAGNOSIS — R7889 Finding of other specified substances, not normally found in blood: Secondary | ICD-10-CM

## 2021-04-01 NOTE — Patient Instructions (Addendum)
It was a pleasure to see you today!  Instructions for you until your next appointment are as follows: We need to recheck Joanna Robinson's Tegretol level and liver enzymes. I will send the order to [facility] as we discussed.  I will fax clearance for Joanna Robinson's dental surgery to Dr Edwin Dada Please sign up for MyChart if you have not done so. Please plan to return for follow up in one year or sooner if needed.    Feel free to contact our office during normal business hours at [phone] with questions or concerns. If there is no answer or the call is outside business hours, please leave a message and our clinic staff will call you back within the next business day.  If you have an urgent concern, please stay on the line for our after-hours answering service and ask for the on-call neurologist.     I also encourage you to use MyChart to communicate with me more directly. If you have not yet signed up for MyChart within [facility], the front desk staff can help you. However, please note that this inbox is NOT monitored on nights or weekends, and response can take up to 2 business days.  Urgent matters should be discussed with the on-call pediatric neurologist.   At [facility], we are committed to providing exceptional care. You will receive a patient satisfaction survey through text or email regarding your visit today. Your opinion is important to me. Comments are appreciated.

## 2021-04-02 ENCOUNTER — Encounter (INDEPENDENT_AMBULATORY_CARE_PROVIDER_SITE_OTHER): Payer: Self-pay | Admitting: Family

## 2021-04-02 MED ORDER — CARBATROL 200 MG PO CP12: ORAL_CAPSULE | ORAL | 5 refills | Status: DC

## 2021-04-02 NOTE — Progress Notes (Signed)
This is a [facility] E-Visit consult/follow up provided via My [place] and her mother Joanna Robinson consented to an E-Visit consult today.  Location of patient: [name] is at home. Location of provider: Normand Robinson is at office Patient was referred by Joanna Kirschner, PA-C   The following participants were involved in this E-Visit: CMA, NP, patient and her mother  This visit was done via VIDEO   Chief Complain/ Reason for E-Visit today: seizure follow up Total time on call: 15 min Follow up: 1 year   Joanna Robinson   MRN:  [id number]  06-11-1980   Provider: Rockwell Germany NP-C Location of Care: [facility]  Visit type: Video return visit  Last visit: 01/03/2020  Referral source: Joanna Rana, MD History from: Epic chart and patient's mother  Brief history:  Copied from previous record: History of undifferentiated autism and complex partial seizures   Today's concerns: Mom reports today that Joanna Robinson has remained seizure free since her last visit. She had blood drawn in November that revealed a Carbamazepine level of 16.6 mcg/ml. Joanna Robinson has had previous elevations of 14.8 mcg/ml in 2021 and 15.3 mcg/ml in 2000. She has had no symptoms of Carbamazepine toxicity.   Mom reports that Joanna Robinson is scheduled for dental work in February and needs clearance for that.  Mom reports that Joanna Robinson has been at her baseline and otherwise generally healthy since she was last seen. Mom has no other health concerns for Joanna Robinson today other than previously mentioned.   Review of systems: Please see HPI for neurologic and other pertinent review of systems. Otherwise all other systems were reviewed and were negative.  Problem List: Patient Active Problem List   Diagnosis Date Noted   Dark stools 07/13/2019   Abnormal blood chemistry 12/20/2014   Abscess of axilla 12/20/2014   Dry eye syndrome 12/20/2014   Dermoid  inclusion cyst 12/20/2014   Hematoma 12/20/2014   HLD (hyperlipidemia) 12/20/2014   Decreased potassium in the blood 12/20/2014   Irregular bleeding 12/20/2014   Leukopenia 12/20/2014   Malaise and fatigue 12/20/2014   Headache 12/18/2014   Acne 10/30/2014   Constipation 09/18/2014   Neutropenia ([place]) 09/18/2014   Elevated carbamazepine level 01/25/2014   Partial epilepsy with impairment of consciousness ([place]) 01/10/2013   Active autistic disorder 01/10/2013   Long-term use of high-risk medication 01/10/2013   Dysmenorrhea 07/10/2008   Mechanical and motor problems with internal organs 11/08/2007   Allergic rhinitis 08/20/2007   External hemorrhoids without complication [id number]   Edema 12/26/2003   Epilepsy, not refractory ([place]) 12/26/2003   Intellectual disability 12/26/2003     Past Medical History:  Diagnosis Date   Autism    Epilepsy ([place])    Hemorrhoid    Hyperlipidemia    Seizures ([place])     Past medical history comments: See HPI  Surgical history: Past Surgical History:  Procedure Laterality Date   NO PAST SURGERIES       Family history: family history includes Alzheimer's disease in her maternal grandmother and paternal grandfather.   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    She has a nurse that comes to her home and they go out on field trips. (2 days a week)    She enjoys exercise, dance, and riding bike. She lives with her mother.   Social  Determinants of Health   Financial Resource Strain: Not on file  Food Insecurity: Not on file  Transportation Needs: Not on file  Physical Activity: Not on file  Stress: Not on file  Social Connections: Not on file  Intimate Partner Violence: Not on file    Past/failed meds:  Allergies: Allergies  Allergen Reactions   Codeine    Corticosteroids Nausea Only     Immunizations: Immunization History  Administered Date(s) Administered   Influenza,inj,Quad PF,6+ Mos 01/07/2013, 01/05/2018, 02/07/2019, 12/30/2019, 01/31/2021   PFIZER(Purple Top)SARS-COV-2 Vaccination 08/16/2019, 09/06/2019, 03/09/2020   Td 12/20/2004   Tdap 08/16/2010    Diagnostics/Screenings:  Physical Exam: Wt 189 lb (85.7 kg)    BMI 28.74 kg/m   Examination was limited by video format and patient's inability to follow instructions General: Well developed, well nourished woman, seated at home with her mother, in no evident distress Head: Head normocephalic and atraumatic. Neck: Supple Musculoskeletal: No obvious deformities or scoliosis Skin: No rashes or neurocutaneous lesions  Neurologic Exam Mental Status: Awake and fully alert. Said "hi" to me on the video but otherwise took little notice of the event.  Motor: Normal functional bulk, tone and strength  Coordination: Balance adequate Gait and Station: Arises from chair without difficulty.  Stance is normal. Did not take steps during the visit  Impression: Partial epilepsy with impairment of consciousness ([place]) - Plan: Carbamazepine level, total, ALT, ALT, Carbamazepine level, total  Long-term use of high-risk medication - Plan: Carbamazepine level, total, ALT, ALT, Carbamazepine level, total  Elevated carbamazepine level - Plan: Carbamazepine level, total, ALT, ALT, Carbamazepine level, total    Recommendations for plan of care: The patient's previous [facility] records were reviewed. [name] has neither had nor required imaging since the last visit.  She has had lab studies that revealed an elevated Carbamazepine level of 16.80mcg/ml. I talked with Mom and recommended repeating the level along with an ALT to check liver enzymes in the next week or so. I will call Mom when I receive these results.   I talked with Mom about the upcoming dental procedure and will send a note to her dentist for clearance. She should not miss her doses of medication around the time of the sedation but otherwise is at no increased risk as compared to her peers.   I will otherwise see Valor back in follow up in 1 year or sooner if needed. Mom agreed with the plans made today.   The medication list was reviewed and reconciled. No changes were made in the prescribed medications today. A complete medication list was provided to the patient.  Orders Placed This Encounter  Procedures   Carbamazepine level, total    Standing Status:   Future    Number of Occurrences:   1    Standing Expiration Date:   06/30/2021   ALT    Standing Status:   Future    Number of Occurrences:   1    Standing Expiration Date:   06/30/2021  Return in about 1 year (around 04/01/2022).   Allergies as of 04/01/2021       Reactions   Codeine    Corticosteroids Nausea Only        Medication List        Accurate as of April 01, 2021 11:59 PM. If you have any questions, ask your nurse or doctor.          acetaZOLAMIDE 250 MG tablet Commonly known as: DIAMOX TAKE 1 TABLET BY MOUTH TWICE DAILY   amoxicillin-clavulanate 875-125 MG tablet Commonly known as: AUGMENTIN Take 1 tablet by mouth 2 (two) times daily.   azelastine 0.05 % ophthalmic solution Commonly known as: OPTIVAR PLACE 1 DROP IN BOTH EYES TWICE DAILY   Azelastine-Fluticasone 137-50 MCG/ACT Susp USE 1 SPRAY INTO EACH NOSTRIL TWICE DAILY   baclofen 10 MG tablet Commonly known as: LIORESAL TAKE 1 TABLET BY MOUTH TWICE DAILY   benzoyl peroxide-erythromycin gel Commonly known as: BENZAMYCIN APPLY TO AFFECTED  AREA(s) TWICE DAILY ASDIRECTED   brompheniramine-pseudoephedrine-DM 30-2-10 MG/5ML syrup Take 5 mLs by mouth 4 (four) times daily as needed.   Carbatrol 200 MG 12 hr capsule Generic drug: carbamazepine TAKE 2 CAPSULES BY MOUTH ONCE EVERY MORNING AND 2 CAPSULES EVERY EVENING   cloNIDine 0.2 MG tablet Commonly known as: CATAPRES TAKE 1 TABLET BY MOUTH AT BEDTIME   ferrous gluconate 324 MG tablet Commonly known as: FERGON TAKE 1 TABLET BY MOUTH TWICE DAILY WITH MEALS   fexofenadine 180 MG tablet Commonly known as: ALLEGRA TAKE 1 TABLET BY MOUTH ONCE DAILY   fluvoxaMINE 100 MG tablet Commonly known as: LUVOX TAKE 1 TABLET BY MOUTH TWICE DAILY   furosemide 20 MG tablet Commonly known as: LASIX Take 1 tablet by mouth daily. 1/2 a tablet daily   GNP Mucus ER 600 MG 12 hr tablet Generic drug: guaiFENesin TAKE 1 TABLET BY MOUTH TWICE DAILY FOR 15 DAYS   hydrochlorothiazide 25 MG tablet Commonly known as: HYDRODIURIL TAKE 1 TABLET BY MOUTH ONCE DAILY   hydrocortisone 2.5 % rectal cream Commonly known as: ANUSOL-HC Place 1 application rectally 2 (two) times daily.   Linzess 145 MCG Caps capsule Generic drug: linaclotide TAKE 1 CAPSULE BY MOUTH ONCE DAILY   Mapap 500 MG tablet Generic drug: acetaminophen TAKE 2 TABLETS BY MOUTH 3 TIMES DAILY ASNEEDED HEADACHE   montelukast 10 MG tablet Commonly known as: SINGULAIR TAKE 1 TABLET BY MOUTH ONCE EVERY EVENING   naproxen 500 MG tablet Commonly known as: NAPROSYN TAKE 1 TABLET BY MOUTH TWICE DAILY   omeprazole 40 MG capsule Commonly known as: PRILOSEC TAKE 1 CAPSULE BY MOUTH ONCE DAILY   polyethylene glycol powder 17 GM/SCOOP powder Commonly known as: GLYCOLAX/MIRALAX MIX 17 GRAMS IN 4 TO 8 OZ OF FLUID AND DRINK AT BEDTIME   potassium chloride 20 MEQ packet Commonly known as: KLOR-CON DISSOLVE 1 PACKET IN 4 OZ OF LIQUID AND DRINK DAILY   sodium fluoride 1.1 % Crea dental cream Commonly known as: Denta 5000  Plus Place 1 application onto teeth every evening. Brush teeth daily for 1 minute, rinse and spit.   sulfacetamide 10 % ophthalmic solution Commonly known as: BLEPH-10 PLACE 2 DROPS INTO RIGHT EYE 4 TIMES DAILY   triamcinolone cream 0.1 % Commonly known as: KENALOG APPLY TO AFFECTED AREA(s) TWICE DAILY ASDIRECTED      Total time spent with the patient was 15 minutes, of which 50% or more was spent in counseling and coordination of care.  Joanna Germany NP-C  [facility] Ph. [phone] Fax [phone]

## 2021-04-04 ENCOUNTER — Other Ambulatory Visit: Payer: Self-pay

## 2021-04-04 DIAGNOSIS — R7889 Finding of other specified substances, not normally found in blood: Secondary | ICD-10-CM

## 2021-04-04 DIAGNOSIS — G40209 Localization-related (focal) (partial) symptomatic epilepsy and epileptic syndromes with complex partial seizures, not intractable, without status epilepticus: Secondary | ICD-10-CM

## 2021-04-04 DIAGNOSIS — Z79899 Other long term (current) drug therapy: Secondary | ICD-10-CM

## 2021-04-08 ENCOUNTER — Other Ambulatory Visit: Payer: Self-pay

## 2021-04-08 ENCOUNTER — Ambulatory Visit (INDEPENDENT_AMBULATORY_CARE_PROVIDER_SITE_OTHER): Payer: Medicare Other | Admitting: Family Medicine

## 2021-04-08 ENCOUNTER — Encounter: Payer: Self-pay | Admitting: Family Medicine

## 2021-04-08 VITALS — BP 99/68 | HR 76 | Temp 98.5°F | Resp 16 | Ht 68.0 in | Wt 166.0 lb

## 2021-04-08 DIAGNOSIS — J069 Acute upper respiratory infection, unspecified: Secondary | ICD-10-CM | POA: Diagnosis not present

## 2021-04-08 DIAGNOSIS — R059 Cough, unspecified: Secondary | ICD-10-CM

## 2021-04-08 DIAGNOSIS — Z01818 Encounter for other preprocedural examination: Secondary | ICD-10-CM

## 2021-04-08 DIAGNOSIS — G40209 Localization-related (focal) (partial) symptomatic epilepsy and epileptic syndromes with complex partial seizures, not intractable, without status epilepticus: Secondary | ICD-10-CM | POA: Diagnosis not present

## 2021-04-08 DIAGNOSIS — Z79899 Other long term (current) drug therapy: Secondary | ICD-10-CM | POA: Diagnosis not present

## 2021-04-08 DIAGNOSIS — R7889 Finding of other specified substances, not normally found in blood: Secondary | ICD-10-CM | POA: Diagnosis not present

## 2021-04-08 MED ORDER — PSEUDOEPH-BROMPHEN-DM 30-2-10 MG/5ML PO SYRP: 5.0000 mL | ORAL_SOLUTION | Freq: Four times a day (QID) | ORAL | 1 refills | Status: DC | PRN

## 2021-04-08 NOTE — Progress Notes (Signed)
° °  SUBJECTIVE:   CHIEF COMPLAINT / HPI:   Surgical assessment - dental procedure with sedation 05/02/21. - typically does well with sedation.  - h/o autism and epilepsy, stable on carbamazepine. No recent seizures. Recent neuro visit 04/01/21, recommended not withholding med day of surgery.  - no congential heart disease or prosthetic joints/limbs.  Headache - with congestion, sore throat x3 days. - no trouble breathing. Taking flovent.  - No fevers. Stable cough. No sick contacts.   OBJECTIVE:   BP 99/68 (BP Location: Right Arm, Patient Position: Sitting, Cuff Size: Large)    Pulse 76    Temp 98.5 F (36.9 C) (Temporal)    Resp 16    Ht 5\' 8"  (1.727 m)    Wt 166 lb (75.3 kg)    SpO2 98%    BMI 25.24 kg/m   Gen: well appearing, in NAD HEENT: oropharynx clear, slightly erythematous. No exudate. NonTTP over frontal and maxillary sinuses. Card: RRR Lungs: CTAB Ext: WWP, no edema   ASSESSMENT/PLAN:   Pre-op assessment Low risk. Continue all meds. No indication for pre-op antibiotics. Will fax office note to dentist.   Viral URI Well appearing, recommend supportive care. Cough syrup sent to pharmacy.    , DO

## 2021-04-09 ENCOUNTER — Telehealth (INDEPENDENT_AMBULATORY_CARE_PROVIDER_SITE_OTHER): Payer: Self-pay | Admitting: Family

## 2021-04-09 DIAGNOSIS — R7889 Finding of other specified substances, not normally found in blood: Secondary | ICD-10-CM

## 2021-04-09 DIAGNOSIS — Z79899 Other long term (current) drug therapy: Secondary | ICD-10-CM

## 2021-04-09 DIAGNOSIS — G40209 Localization-related (focal) (partial) symptomatic epilepsy and epileptic syndromes with complex partial seizures, not intractable, without status epilepticus: Secondary | ICD-10-CM

## 2021-04-09 LAB — ALT: ALT: 10 IU/L (ref 0–32)

## 2021-04-09 LAB — CARBAMAZEPINE LEVEL, TOTAL: Carbamazepine (Tegretol), S: 17.7 ug/mL (ref 4.0–12.0)

## 2021-04-09 MED ORDER — CARBATROL 200 MG PO CP12: ORAL_CAPSULE | ORAL | 5 refills | Status: DC

## 2021-04-09 NOTE — Telephone Encounter (Signed)
-----   Message from Kizzie Furnish, [place] sent at 04/09/2021  9:12 AM EST -----  ----- Message ----- From: [name], PA-C Sent: 04/09/2021   8:06 AM EST To: Thedore Mins Nurse  Can we have these results sent to her neurologist please ----- Message ----- From: Lavone Neri Lab Results In Sent: 04/09/2021   7:37 AM EST To: [name], PA-C

## 2021-04-09 NOTE — Telephone Encounter (Signed)
I called the results to Mom and recommended that the Carbatrol dose decrease to 1 capsule in the morning and 2 capsules at night. We will recheck the level in 1 week. Mom agreed with these plans. TG

## 2021-04-10 NOTE — Telephone Encounter (Signed)
LMTCB 04/10/2021.   Thanks,   -Vernona Rieger

## 2021-04-29 ENCOUNTER — Telehealth (INDEPENDENT_AMBULATORY_CARE_PROVIDER_SITE_OTHER): Payer: Self-pay | Admitting: Family

## 2021-04-29 NOTE — Telephone Encounter (Signed)
°  Who's calling (name and relationship to patient) :[name]  Best contact number: [phone] Provider they see: [place]  Reason for call:  Please send lab orders to [facility] in order for patient to complete tomorrow    PRESCRIPTION REFILL ONLY  Name of prescription:  Pharmacy:

## 2021-04-30 ENCOUNTER — Other Ambulatory Visit: Payer: Self-pay

## 2021-04-30 ENCOUNTER — Other Ambulatory Visit (INDEPENDENT_AMBULATORY_CARE_PROVIDER_SITE_OTHER): Payer: Self-pay | Admitting: Family

## 2021-04-30 DIAGNOSIS — R7889 Finding of other specified substances, not normally found in blood: Secondary | ICD-10-CM

## 2021-04-30 DIAGNOSIS — G40209 Localization-related (focal) (partial) symptomatic epilepsy and epileptic syndromes with complex partial seizures, not intractable, without status epilepticus: Secondary | ICD-10-CM

## 2021-04-30 DIAGNOSIS — Z79899 Other long term (current) drug therapy: Secondary | ICD-10-CM

## 2021-04-30 NOTE — Telephone Encounter (Signed)
The order has been faxed. TG

## 2021-04-30 NOTE — Progress Notes (Deleted)
I,Sha'taria Allysa Governale,acting as a Neurosurgeon for [employer], PA-C.,have documented all relevant documentation on the behalf of [name], PA-C,as directed by  [name], PA-C while in the presence of [name], PA-C.'  Established patient visit   Patient: Joanna Robinson   DOB: [birth year]   41 y.o. Female  MRN: [id number] Visit Date: 05/01/2021  Today's healthcare provider: [name], PA-C   No chief complaint on file.  Subjective    HPI  Sore Throat Patient complains of sore throat. Associated symptoms include {symptoms; uri:5001::"nasal blockage","post nasal drip","sinus and nasal congestion","sore throat"}. Onset of symptoms was {1-10:13787} {time; units:18646} ago, and have been {clinical course:17} since that time. She {hydration history:15378}. She {has/ has not:18111} had recent close exposure to someone with proven streptococcal pharyngitis. Conjunctivitis Patient presents for evaluation of {symptoms:16443} in {right/left/both eyes:19653}. She has noticed the above symptoms for {1-10:13787} {time: units:18646}.  Onset was {pain onset:16558}. Patient denies {eye symptoms:16443}. There is a history of {eye hx:16612}. Medications: Outpatient Medications Prior to Visit  Medication Sig   acetaZOLAMIDE (DIAMOX) 250 MG tablet TAKE 1 TABLET BY MOUTH TWICE DAILY   azelastine (OPTIVAR) 0.05 % ophthalmic solution PLACE 1 DROP IN BOTH EYES TWICE DAILY   Azelastine-Fluticasone 137-50 MCG/ACT SUSP USE 1 SPRAY INTO EACH NOSTRIL TWICE DAILY   baclofen (LIORESAL) 10 MG tablet TAKE 1 TABLET BY MOUTH TWICE DAILY   benzoyl peroxide-erythromycin (BENZAMYCIN) gel APPLY TO AFFECTED AREA(s) TWICE DAILY ASDIRECTED   brompheniramine-pseudoephedrine-DM 30-2-10 MG/5ML syrup Take 5 mLs by mouth 4 (four) times daily as needed.   CARBATROL 200 MG 12 hr capsule TAKE 1 CAPSULE BY MOUTH ONCE EVERY MORNING AND 2 CAPSULES EVERY EVENING   cloNIDine (CATAPRES) 0.2 MG tablet TAKE 1 TABLET BY  MOUTH AT BEDTIME   ferrous gluconate (FERGON) 324 MG tablet TAKE 1 TABLET BY MOUTH TWICE DAILY WITH MEALS   fexofenadine (ALLEGRA) 180 MG tablet TAKE 1 TABLET BY MOUTH ONCE DAILY   fluvoxaMINE (LUVOX) 100 MG tablet TAKE 1 TABLET BY MOUTH TWICE DAILY   furosemide (LASIX) 20 MG tablet Take 1 tablet by mouth daily. 1/2 a tablet daily   GNP MUCUS ER 600 MG 12 hr tablet TAKE 1 TABLET BY MOUTH TWICE DAILY FOR 15 DAYS (Patient not taking: Reported on 04/01/2021)   hydrochlorothiazide (HYDRODIURIL) 25 MG tablet TAKE 1 TABLET BY MOUTH ONCE DAILY   hydrocortisone (ANUSOL-HC) 2.5 % rectal cream Place 1 application rectally 2 (two) times daily.   LINZESS 145 MCG CAPS capsule TAKE 1 CAPSULE BY MOUTH ONCE DAILY   MAPAP 500 MG tablet TAKE 2 TABLETS BY MOUTH 3 TIMES DAILY ASNEEDED HEADACHE (Patient not taking: Reported on 04/01/2021)   montelukast (SINGULAIR) 10 MG tablet TAKE 1 TABLET BY MOUTH ONCE EVERY EVENING (Patient not taking: Reported on 04/01/2021)   naproxen (NAPROSYN) 500 MG tablet TAKE 1 TABLET BY MOUTH TWICE DAILY (Patient not taking: Reported on 04/01/2021)   omeprazole (PRILOSEC) 40 MG capsule TAKE 1 CAPSULE BY MOUTH ONCE DAILY   polyethylene glycol powder (GLYCOLAX/MIRALAX) 17 GM/SCOOP powder MIX 17 GRAMS IN 4 TO 8 OZ OF FLUID AND DRINK AT BEDTIME (Patient not taking: Reported on 04/01/2021)   potassium chloride (KLOR-CON) 20 MEQ packet DISSOLVE 1 PACKET IN 4 OZ OF LIQUID AND DRINK DAILY   sodium fluoride (DENTA 5000 PLUS) 1.1 % CREA dental cream Place 1 application onto teeth every evening. Brush teeth daily for 1 minute, rinse and spit.   sulfacetamide (BLEPH-10) 10 % ophthalmic solution PLACE 2 DROPS  INTO RIGHT EYE 4 TIMES DAILY (Patient not taking: Reported on 04/01/2021)   triamcinolone cream (KENALOG) 0.1 % APPLY TO AFFECTED AREA(s) TWICE DAILY ASDIRECTED   No facility-administered medications prior to visit.    Review of Systems  {Labs   Heme   Chem   Endocrine   Serology   Results Review  (optional):23779}   Objective    There were no vitals taken for this visit. {Show previous vital signs (optional):23777}  Physical Exam  ***  No results found for any visits on 05/01/21.  Assessment & Plan     ***  No follow-ups on file.      {provider attestation***:1}   [name], PA-C  [facility] [phone] (phone) [phone] (fax)  [facility]

## 2021-05-01 ENCOUNTER — Telehealth: Payer: Self-pay

## 2021-05-01 ENCOUNTER — Ambulatory Visit: Payer: Medicare Other | Admitting: Physician Assistant

## 2021-05-01 ENCOUNTER — Ambulatory Visit (INDEPENDENT_AMBULATORY_CARE_PROVIDER_SITE_OTHER): Payer: Medicare Other | Admitting: Family Medicine

## 2021-05-01 ENCOUNTER — Other Ambulatory Visit: Payer: Self-pay

## 2021-05-01 ENCOUNTER — Encounter: Payer: Self-pay | Admitting: Family Medicine

## 2021-05-01 VITALS — BP 96/66 | HR 88 | Resp 16 | Wt 166.6 lb

## 2021-05-01 DIAGNOSIS — G40209 Localization-related (focal) (partial) symptomatic epilepsy and epileptic syndromes with complex partial seizures, not intractable, without status epilepticus: Secondary | ICD-10-CM | POA: Diagnosis not present

## 2021-05-01 DIAGNOSIS — J0110 Acute frontal sinusitis, unspecified: Secondary | ICD-10-CM

## 2021-05-01 DIAGNOSIS — Z79899 Other long term (current) drug therapy: Secondary | ICD-10-CM | POA: Diagnosis not present

## 2021-05-01 MED ORDER — AMOXICILLIN-POT CLAVULANATE 875-125 MG PO TABS: 1.0000 | ORAL_TABLET | Freq: Two times a day (BID) | ORAL | 0 refills | Status: AC

## 2021-05-01 NOTE — Progress Notes (Signed)
° ° °  SUBJECTIVE:   CHIEF COMPLAINT / HPI:   UPPER RESPIRATORY TRACT INFECTION - seen previously 1/16 with sore throat, congestion - mom reports persistence since last visit.   Fever: no Cough: yes, nonproductive Shortness of breath: no Chest pain: hasn't endorsed Chest tightness: hasn't endorsed Chest congestion: unsure Nasal congestion: yes Runny nose: yes Sore throat: yes Headache: yes Sick contacts: no Relief with OTC cold/cough medications: cough syrup helps some Treatments attempted:  cough syrup,    OBJECTIVE:   BP 96/66    Pulse 88    Resp 16    Wt 166 lb 9.6 oz (75.6 kg)    SpO2 100%    BMI 25.33 kg/m   Gen: well appearing, in NAD HEENT: orophyarynx clear without exudate or erythema. Uvula midline. No tonsillar enlargement. Good dentition. TM visible b/l without bulging, erythema, purulence. No cervical or supraclavicular lymphadenopathy. TTP over R frontal sinus. Card: RRR Lungs: CTAB Ext: WWP, no edema   ASSESSMENT/PLAN:   Sinusitis Persistent s/p usual care. Reasonable to treat with antibiotics at this time given duration of symptoms, frontal sinus tenderness and difficulty ascertaining symptoms d/t intellectual disability.     Caro Laroche, DO

## 2021-05-01 NOTE — Telephone Encounter (Signed)
FYI-this is your 2:20pm appointment

## 2021-05-01 NOTE — Telephone Encounter (Signed)
Copied from CRM [id number]. Topic: General - Other >> May 01, 2021  2:13 PM Jaquita Rector A wrote: Reason for CRM: Patient mom called in states that she is on her way but they are stuck in traffic and she will be there soon as she can she states that nothing is moving. Any questions please call Ph# [phone] >> May 01, 2021  2:24 PM Randol Kern wrote: Pt's mother called back to report that she is still stuck in traffic

## 2021-05-02 ENCOUNTER — Other Ambulatory Visit: Payer: Self-pay | Admitting: Physician Assistant

## 2021-05-02 ENCOUNTER — Other Ambulatory Visit: Payer: Self-pay | Admitting: Family Medicine

## 2021-05-02 DIAGNOSIS — M62838 Other muscle spasm: Secondary | ICD-10-CM

## 2021-05-02 LAB — CARBAMAZEPINE LEVEL, TOTAL: Carbamazepine (Tegretol), S: 16.7 ug/mL (ref 4.0–12.0)

## 2021-05-02 NOTE — Telephone Encounter (Signed)
Requested Prescriptions  Pending Prescriptions Disp Refills   LINZESS 145 MCG CAPS capsule [Pharmacy Med Name: LINZESS 145 MCG CAP] 90 capsule 0    Sig: TAKE 1 CAPSULE BY MOUTH ONCE DAILY     Gastroenterology: Irritable Bowel Syndrome Passed - 05/02/2021 10:48 AM      Passed - Valid encounter within last 12 months    Recent Outpatient Visits          Yesterday Subacute frontal sinusitis   [facility] Caro Laroche, DO   3 weeks ago Pre-op evaluation   [facility] Caro Laroche, DO   3 months ago Elevated carbamazepine level   [facility] [place], [place], PA-C   6 months ago Pure hypercholesterolemia   [facility], Marzella Schlein, MD   10 months ago Neck pain   [facility] [name] [place], [place]

## 2021-05-03 ENCOUNTER — Telehealth (INDEPENDENT_AMBULATORY_CARE_PROVIDER_SITE_OTHER): Payer: Self-pay | Admitting: Family

## 2021-05-03 DIAGNOSIS — G40209 Localization-related (focal) (partial) symptomatic epilepsy and epileptic syndromes with complex partial seizures, not intractable, without status epilepticus: Secondary | ICD-10-CM

## 2021-05-03 MED ORDER — CARBATROL 200 MG PO CP12: ORAL_CAPSULE | ORAL | 5 refills | Status: DC

## 2021-05-03 NOTE — Telephone Encounter (Signed)
I called Mom about the elevated Carbamazepine level. I recommended decreasing the dose again to 1 capsule BID and to recheck in a week or so. I explained that it is best to slowly taper the medication dose rather than to abruptly take it away. Mom agreed with these plans. TG

## 2021-05-05 ENCOUNTER — Other Ambulatory Visit: Payer: Self-pay | Admitting: Family Medicine

## 2021-05-05 DIAGNOSIS — E876 Hypokalemia: Secondary | ICD-10-CM

## 2021-05-06 NOTE — Telephone Encounter (Signed)
Requested Prescriptions  Pending Prescriptions Disp Refills   potassium chloride (KLOR-CON) 20 MEQ packet [Pharmacy Med Name: POTASSIUM CHLORIDE 20 MEQ PACK] 90 each 1    Sig: DISSOLVE 1 PACKET IN 4OZ OF LIQUID AND DRINK ONCE DAILY     Endocrinology:  Minerals - Potassium Supplementation Passed - 05/05/2021  2:59 PM      Passed - K in normal range and within 360 days    Potassium  Date Value Ref Range Status  01/31/2021 3.9 3.5 - 5.2 mmol/L Final         Passed - Cr in normal range and within 360 days    Creatinine, Ser  Date Value Ref Range Status  01/31/2021 0.73 0.57 - 1.00 mg/dL Final         Passed - Valid encounter within last 12 months    Recent Outpatient Visits          5 days ago Subacute frontal sinusitis   [facility] Caro Laroche, DO   4 weeks ago Pre-op evaluation   [facility] Caro Laroche, DO   3 months ago Elevated carbamazepine level   [facility] Ok Edwards, [place], PA-C   6 months ago Pure hypercholesterolemia   [facility] [place], Marzella Schlein, MD   10 months ago Neck pain   [facility] [name] [place], [place]

## 2021-05-07 ENCOUNTER — Ambulatory Visit: Payer: Self-pay | Admitting: *Deleted

## 2021-05-07 MED ORDER — AMOXICILLIN-POT CLAVULANATE 875-125 MG PO TABS: 1.0000 | ORAL_TABLET | Freq: Two times a day (BID) | ORAL | 0 refills | Status: AC

## 2021-05-07 NOTE — Telephone Encounter (Signed)
I returned pt's mother's call, Sharion Dove on the [facility], because after 5 days on an antibiotic Mykalah still isn't better.   Still has a sore throat and sore neck.  [name] had told her if she wasn't better then she would need another antibiotic.   Another antibiotic being requested.  I left a voicemail to call back.

## 2021-05-07 NOTE — Telephone Encounter (Signed)
Patient's mother is reporting patient is still complaining of sore throat and neck. Patient's mother states usually takes 10 days of antibiotic. Was told to call back if not better. Requesting another RF.

## 2021-05-07 NOTE — Telephone Encounter (Signed)
3rd attempt, pt's mother called, left VM to call office back to discuss pt's symptoms. Unable to reach patient after 3 attempts by [facility] NT, routing to the provider for resolution per protocol.

## 2021-05-07 NOTE — Telephone Encounter (Signed)
Pt's mother called and left VM to call office back to discuss with a nurse  Summary: still not better after 5 days abx   Mom calling to let the dr know that after the abx last week, pt still has sore throat and sore neck.  She said Mardella Layman told her if pt wasn't better, she would call in another abx.  So she is requesting.

## 2021-05-07 NOTE — Addendum Note (Signed)
Addended by: Caro Laroche on: 05/07/2021 05:03 PM   Modules accepted: Orders

## 2021-05-07 NOTE — Telephone Encounter (Signed)
Patient's mother Sharion Dove aware.

## 2021-05-17 ENCOUNTER — Other Ambulatory Visit: Payer: Self-pay | Admitting: Physician Assistant

## 2021-05-17 DIAGNOSIS — R7889 Finding of other specified substances, not normally found in blood: Secondary | ICD-10-CM | POA: Diagnosis not present

## 2021-05-17 DIAGNOSIS — G40209 Localization-related (focal) (partial) symptomatic epilepsy and epileptic syndromes with complex partial seizures, not intractable, without status epilepticus: Secondary | ICD-10-CM | POA: Diagnosis not present

## 2021-05-17 DIAGNOSIS — Z79899 Other long term (current) drug therapy: Secondary | ICD-10-CM | POA: Diagnosis not present

## 2021-05-18 LAB — CARBAMAZEPINE LEVEL, TOTAL: Carbamazepine (Tegretol), S: 12.1 ug/mL — ABNORMAL HIGH (ref 4.0–12.0)

## 2021-05-18 LAB — ALT: ALT: 8 IU/L (ref 0–32)

## 2021-05-20 NOTE — Telephone Encounter (Signed)
I called recent lab results to Mom. We will continue Carbatrol as prescribed for now. TG

## 2021-05-20 NOTE — Telephone Encounter (Signed)
I called and left a message for Mom. I will call her again late today or tomorrow. TG

## 2021-05-27 ENCOUNTER — Telehealth (INDEPENDENT_AMBULATORY_CARE_PROVIDER_SITE_OTHER): Payer: Self-pay | Admitting: Family

## 2021-05-27 DIAGNOSIS — G40209 Localization-related (focal) (partial) symptomatic epilepsy and epileptic syndromes with complex partial seizures, not intractable, without status epilepticus: Secondary | ICD-10-CM

## 2021-05-27 MED ORDER — CARBATROL 200 MG PO CP12: ORAL_CAPSULE | ORAL | 5 refills | Status: DC

## 2021-05-27 NOTE — Telephone Encounter (Signed)
Spoke with mom. She informs she was trying to get the bubble pack ordered for Thursday and pharmacist had not received the order from February 1 tab BID. Let mom know we would get that taken care of for her. MOm states gratitude and ended the call.  ?

## 2021-05-27 NOTE — Telephone Encounter (Signed)
Call to pharm. He reports he did not receive the 2/10 Rx his last one is from 03/2021. RN resent the rx and left a message for mom that it was sent.  ?

## 2021-05-27 NOTE — Telephone Encounter (Signed)
?  Who's calling (name and relationship to patient) :mother  ? ?Best contact number: [phone]  ? ?Provider they DGL:[facility]  ? ?Reason for call: ?Pharmacist is giving hard time about Maleeha script being changed again. Mom would like Inetta Fermo to call to confirm the change.  ? ? ? ?PRESCRIPTION REFILL ONLY ? ?Name of prescription: Carbatrol ? ?Pharmacy: ? ? ?

## 2021-05-28 ENCOUNTER — Other Ambulatory Visit: Payer: Self-pay | Admitting: Family Medicine

## 2021-05-28 ENCOUNTER — Other Ambulatory Visit: Payer: Self-pay | Admitting: Physician Assistant

## 2021-05-28 DIAGNOSIS — N946 Dysmenorrhea, unspecified: Secondary | ICD-10-CM

## 2021-05-28 DIAGNOSIS — R609 Edema, unspecified: Secondary | ICD-10-CM

## 2021-05-28 DIAGNOSIS — D508 Other iron deficiency anemias: Secondary | ICD-10-CM

## 2021-05-28 DIAGNOSIS — K219 Gastro-esophageal reflux disease without esophagitis: Secondary | ICD-10-CM

## 2021-05-28 NOTE — Telephone Encounter (Signed)
Requested Prescriptions  ?Pending Prescriptions Disp Refills  ?? naproxen (NAPROSYN) 500 MG tablet [Pharmacy Med Name: NAPROXEN 500 MG TAB] 180 tablet 1  ?  Sig: TAKE 1 TABLET BY MOUTH TWICE DAILY  ?  ? Analgesics:  NSAIDS Failed - 05/28/2021 10:10 AM  ?  ?  Failed - Manual Review: Labs are only required if the patient has taken medication for more than 8 weeks.  ?  ?  Passed - Cr in normal range and within 360 days  ?  Creatinine, Ser  ?Date Value Ref Range Status  ?01/31/2021 0.73 0.57 - 1.00 mg/dL Final  ?   ?  ?  Passed - HGB in normal range and within 360 days  ?  Hemoglobin  ?Date Value Ref Range Status  ?10/15/2020 13.2 11.1 - 15.9 g/dL Final  ?   ?  ?  Passed - PLT in normal range and within 360 days  ?  Platelets  ?Date Value Ref Range Status  ?10/15/2020 209 150 - 450 x10E3/uL Final  ?   ?  ?  Passed - HCT in normal range and within 360 days  ?  Hematocrit  ?Date Value Ref Range Status  ?10/15/2020 41.5 34.0 - 46.6 % Final  ?   ?  ?  Passed - eGFR is 30 or above and within 360 days  ?  GFR calc Af Amer  ?Date Value Ref Range Status  ?05/16/2020 124 >59 mL/min/1.73 Final  ?  Comment:  ?  **In accordance with recommendations from the NKF-ASN Task force,** ?  [facility] is in the process of updating its eGFR calculation to the ?  2021 CKD-EPI creatinine equation that estimates kidney function ?  without a race variable. ?  ? ?GFR calc non Af Amer  ?Date Value Ref Range Status  ?05/16/2020 107 >59 mL/min/1.73 Final  ? ?eGFR  ?Date Value Ref Range Status  ?01/31/2021 108 >59 mL/min/1.73 Final  ?   ?  ?  Passed - Patient is not pregnant  ?  ?  Passed - Valid encounter within last 12 months  ?  Recent Outpatient Visits   ?      ? 3 weeks ago Subacute frontal sinusitis  ? [place], DO  ? 1 month ago Pre-op evaluation  ? [place], DO  ? 3 months ago Elevated carbamazepine level  ? [facility] Thedore Mins, Ria Comment, PA-C  ? 7 months ago Pure  hypercholesterolemia  ? [place], MD  ? 11 months ago Neck pain  ? [place], [place]  ?  ?  ? ?  ?  ?  ?? omeprazole (PRILOSEC) 40 MG capsule [Pharmacy Med Name: OMEPRAZOLE DR 40 MG CAP] 90 capsule 1  ?  Sig: TAKE 1 CAPSULE BY MOUTH ONCE DAILY  ?  ? Gastroenterology: Proton Pump Inhibitors Passed - 05/28/2021 10:10 AM  ?  ?  Passed - Valid encounter within last 12 months  ?  Recent Outpatient Visits   ?      ? 3 weeks ago Subacute frontal sinusitis  ? [place], DO  ? 1 month ago Pre-op evaluation  ? [place], DO  ? 3 months ago Elevated carbamazepine level  ? [facility] Thedore Mins, Ria Comment, PA-C  ? 7 months ago Pure hypercholesterolemia  ? [facility] [place]al[place], Dionne Bucy, MD  ?  11 months ago Neck pain  ? [facility], Anderson Malta M, [place]  ?  ?  ? ?  ?  ?  ? ? ?

## 2021-05-28 NOTE — Telephone Encounter (Signed)
Requested medications are due for refill today.  yes ? ?Requested medications are on the active medications list.  yes ? ?Last refill. 11/21/2020 #180 1 refill ? ?Future visit scheduled.   no ? ?Notes to clinic.  Refill failed protocol d/t missing labs. ? ? ? ?Requested Prescriptions  ?Pending Prescriptions Disp Refills  ? ferrous gluconate (FERGON) 324 MG tablet [Pharmacy Med Name: FERROUS GLUCONATE 324 (38 FE) MG TA] 180 tablet 1  ?  Sig: TAKE 1 TABLET BY MOUTH TWICE DAILY WITH MEALS  ?  ? Endocrinology:  Minerals - Iron Supplementation Failed - 05/28/2021 10:11 AM  ?  ?  Failed - Fe (serum) in normal range and within 360 days  ?  Iron  ?Date Value Ref Range Status  ?09/28/2019 76 27 - 159 ug/dL Final  ? ?Iron Saturation  ?Date Value Ref Range Status  ?09/28/2019 36 15 - 55 % Final  ?  ?  ?  ?  Failed - Ferritin in normal range and within 360 days  ?  Ferritin  ?Date Value Ref Range Status  ?09/28/2019 32 15 - 150 ng/mL Final  ?  ?  ?  ?  Passed - HGB in normal range and within 360 days  ?  Hemoglobin  ?Date Value Ref Range Status  ?10/15/2020 13.2 11.1 - 15.9 g/dL Final  ?  ?  ?  ?  Passed - HCT in normal range and within 360 days  ?  Hematocrit  ?Date Value Ref Range Status  ?10/15/2020 41.5 34.0 - 46.6 % Final  ?  ?  ?  ?  Passed - RBC in normal range and within 360 days  ?  RBC  ?Date Value Ref Range Status  ?10/15/2020 4.39 3.77 - 5.28 x10E6/uL Final  ?  ?  ?  ?  Passed - Valid encounter within last 12 months  ?  Recent Outpatient Visits   ? ?      ? 3 weeks ago Subacute frontal sinusitis  ? [place], DO  ? 1 month ago Pre-op evaluation  ? [place], DO  ? 3 months ago Elevated carbamazepine level  ? [facility] Thedore Mins, Ria Comment, PA-C  ? 7 months ago Pure hypercholesterolemia  ? [place], [place]  ? 11 months ago Neck pain  ? [facility] [place], Anderson Malta M, [place]  ? ?  ?  ? ?  ?   ?  ?  ?

## 2021-05-30 ENCOUNTER — Other Ambulatory Visit: Payer: Self-pay | Admitting: Family Medicine

## 2021-05-30 DIAGNOSIS — G479 Sleep disorder, unspecified: Secondary | ICD-10-CM

## 2021-05-31 ENCOUNTER — Other Ambulatory Visit: Payer: Self-pay | Admitting: Family Medicine

## 2021-05-31 NOTE — Telephone Encounter (Signed)
Requested Prescriptions  ?Pending Prescriptions Disp Refills  ?? cloNIDine (CATAPRES) 0.2 MG tablet [Pharmacy Med Name: CLONIDINE HCL 0.2 MG TAB] 90 tablet 0  ?  Sig: TAKE 1 TABLET BY MOUTH AT BEDTIME  ?  ? Cardiovascular:  Alpha-2 Agonists Passed - 05/30/2021  6:50 PM  ?  ?  Passed - Last BP in normal range  ?  BP Readings from Last 1 Encounters:  ?05/01/21 96/66  ?   ?  ?  Passed - Last Heart Rate in normal range  ?  Pulse Readings from Last 1 Encounters:  ?05/01/21 88  ?   ?  ?  Passed - Valid encounter within last 6 months  ?  Recent Outpatient Visits   ?      ? 1 month ago Subacute frontal sinusitis  ? [facility] [place] D[place], DO  ? 1 month ago Pre-op evaluation  ? [facility] [place] Dense [place], DO  ? 4 months ago Elevated carbamazepine level  ? [facility] [place], [name], PA-C  ? 7 months ago Pure hypercholesterolemia  ? [facility] [place], Marzella Schlein, MD  ? 11 months ago Neck pain  ? [facility] [place], Victor[place] Dike M, [place]  ?  ?  ? ?  ?  ?  ? ?

## 2021-05-31 NOTE — Telephone Encounter (Signed)
Requested medication (s) are due for refill today:   Yes ? ?Requested medication (s) are on the active medication list:   Yes ? ?Future visit scheduled:   No  ? ? ?Last ordered: 03/12/2021 #180, 0 refills as a courtesy refill. ? ?Returned because wasn't sure if [name] needed to see her again before another refill was approved or not.  Dr. Linwood Dibbles saw pt on 04/08/2021 since note written at the time of the rx that she needed an appt for further refills.     ? ?Requested Prescriptions  ?Pending Prescriptions Disp Refills  ? fluvoxaMINE (LUVOX) 100 MG tablet [Pharmacy Med Name: FLUVOXAMINE MALEATE 100 MG TAB] 180 tablet 0  ?  Sig: TAKE 1 TABLET BY MOUTH TWICE DAILY  ?  ? Psychiatry:  Antidepressants - SSRI Passed - 05/31/2021  8:54 AM  ?  ?  Passed - Valid encounter within last 6 months  ?  Recent Outpatient Visits   ? ?      ? 1 month ago Subacute frontal sinusitis  ? [facility] [place] Dense [place], DO  ? 1 month ago Pre-op evaluation  ? [facility] [place] Dense [place], DO  ? 4 months ago Elevated carbamazepine level  ? [facility] [place], [name], PA-C  ? 7 months ago Pure hypercholesterolemia  ? [facility] [place], Marzella Schlein, MD  ? 11 months ago Neck pain  ? [facility] [place], Victor[place] Dike M, [place]  ? ?  ?  ? ?  ?  ?  ? ?

## 2021-06-11 ENCOUNTER — Telehealth: Payer: Self-pay | Admitting: Physician Assistant

## 2021-06-11 NOTE — Telephone Encounter (Signed)
Copied from [place] [id number]. Topic: Medicare AWV ?>> Jun 11, 2021 11:38 AM Cher Nakai R wrote: ?Reason for CRM:  ?Left message for patient to call back and schedule Medicare Annual Wellness Visit (AWV) in office.  ? ?If not able to come in office, please offer to do virtually or by telephone.  ? ?Last AWV:12/30/2019 ? ?Please schedule at anytime with [facility] Health Advisor. ? ?If any questions, please contact me at [phone] ?

## 2021-06-19 ENCOUNTER — Telehealth: Payer: Self-pay

## 2021-06-19 NOTE — Telephone Encounter (Signed)
Copied from [place] [id number]. Topic: General - Other >> Jun 19, 2021 12:33 PM Yvette Rack wrote: Reason for CRM: Pt mother reports pt will be having surgery on 07/16/21 and she will be faxing in some paperwork to pt pcp.

## 2021-06-26 ENCOUNTER — Other Ambulatory Visit (INDEPENDENT_AMBULATORY_CARE_PROVIDER_SITE_OTHER): Payer: Self-pay | Admitting: Family

## 2021-06-26 ENCOUNTER — Other Ambulatory Visit: Payer: Self-pay | Admitting: Family Medicine

## 2021-06-26 DIAGNOSIS — G40209 Localization-related (focal) (partial) symptomatic epilepsy and epileptic syndromes with complex partial seizures, not intractable, without status epilepticus: Secondary | ICD-10-CM

## 2021-07-04 ENCOUNTER — Telehealth: Payer: Self-pay

## 2021-07-04 NOTE — Telephone Encounter (Signed)
Copied from CRM [id number]. Topic: General - Other ?>> Jul 04, 2021  1:15 PM Traci Sermon wrote: ?Reason for CRM: Pts mother called in stating pt is supposed to be having some teeth pulled, and the oral surgeon was supposed to be sending a form to be signed by PCP, but she states they told her they have not received it yet, it was supposed to be coming from Dr. Anselm Pancoast*, please advise. ?

## 2021-07-19 ENCOUNTER — Other Ambulatory Visit: Payer: Self-pay | Admitting: Family Medicine

## 2021-07-19 DIAGNOSIS — H10401 Unspecified chronic conjunctivitis, right eye: Secondary | ICD-10-CM

## 2021-07-19 NOTE — Telephone Encounter (Signed)
Requested medication (s) are due for refill today: Yes ? ?Requested medication (s) are on the active medication list: Yes ? ?Last refill:  01/14/21 ? ?Future visit scheduled: No ? ?Notes to clinic:  See request. ? ? ? ?Requested Prescriptions  ?Pending Prescriptions Disp Refills  ? sulfacetamide (BLEPH-10) 10 % ophthalmic solution [Pharmacy Med Name: SULFACETAMIDE SODIUM 10% EYE SOLN M] 15 mL 5  ?  Sig: PLACE 2 DROPS IN RIGHT EYE 4 TIMES DAILY  ?  ? Off-Protocol Failed - 07/19/2021 12:42 PM  ?  ?  Failed - Medication not assigned to a protocol, review manually.  ?  ?  Passed - Valid encounter within last 12 months  ?  Recent Outpatient Visits   ? ?      ? 2 months ago Subacute frontal sinusitis  ? [facility] [place] Dense [place], DO  ? 3 months ago Pre-op evaluation  ? [facility] [place] Dense M, DO  ? 5 months ago Elevated carbamazepine level  ? [facility] [place], [name], PA-C  ? 9 months ago Pure hypercholesterolemia  ? [facility] [place], Marzella Schlein, MD  ? 1 year ago Neck pain  ? [facility] [place], Victor[place] Dike M, [place]  ? ?  ?  ? ? ?  ?  ?  ?Signed Prescriptions Disp Refills  ? LINZESS 145 MCG CAPS capsule 90 capsule 0  ?  Sig: TAKE 1 CAPSULE BY MOUTH ONCE DAILY  ?  ? Gastroenterology: Irritable Bowel Syndrome Passed - 07/19/2021 12:42 PM  ?  ?  Passed - Valid encounter within last 12 months  ?  Recent Outpatient Visits   ? ?      ? 2 months ago Subacute frontal sinusitis  ? [facility] [place] Dense M, DO  ? 3 months ago Pre-op evaluation  ? [facility] [place] Dense M, DO  ? 5 months ago Elevated carbamazepine level  ? [facility] Ok [place], [name], PA-C  ? 9 months ago Pure hypercholesterolemia  ? [facility] [employer], Marzella Schlein, MD  ? 1 year ago Neck pain  ? [facility] [place], Victor[place] Dike M, [place]  ? ?  ?  ? ? ?  ?  ?  ? ?

## 2021-07-24 ENCOUNTER — Telehealth: Payer: Self-pay | Admitting: Physician Assistant

## 2021-07-24 NOTE — Telephone Encounter (Signed)
Copied from CRM [id number]. Topic: Medicare AWV ?>> Jul 24, 2021 11:36 AM Claudette Laws R wrote: ?Reason for CRM:  ?Left message for patient to call back and schedule Medicare Annual Wellness Visit (AWV) in office.  ? ?If unable to come into the office for AWV,  please offer to do virtually or by telephone. ? ?Last AWV: 12/30/2019 ? ?Please schedule at anytime with [facility] Health Advisor. ? ?30 minute appointment for Virtual or phone ?45 minute appointment for in office or Initial virtual/phone ? ?Any questions, please contact me at [phone] ?

## 2021-08-19 ENCOUNTER — Other Ambulatory Visit: Payer: Self-pay | Admitting: Family Medicine

## 2021-08-19 DIAGNOSIS — J302 Other seasonal allergic rhinitis: Secondary | ICD-10-CM

## 2021-09-11 ENCOUNTER — Telehealth (INDEPENDENT_AMBULATORY_CARE_PROVIDER_SITE_OTHER): Payer: Self-pay | Admitting: Family

## 2021-09-11 NOTE — Telephone Encounter (Signed)
I called and spoke with [name], with [facility]. She is working to coordinate care and caregivers for [employer]. She had questions about Jaeline's seizure frequency and seizure medications, as well as her ability to care for herself with her history of autism. I answered her questions regarding these things. She asked for copy of office notes for the last 2 years to be faxed to her at [phone]. I will verify the ROI and send the records as requested. TG

## 2021-09-11 NOTE — Telephone Encounter (Signed)
Who's calling (name and relationship to patient) : [name]  Best contact number: [phone] ext: [phone]  Provider they see: [name]  Reason for call: [name] was calling in to speak with a nurse or provider regarding care for Joanna Robinson wanting to know the level of care and pt's health info. She has requested a call back.   Call ID:      PRESCRIPTION REFILL ONLY  Name of prescription:  Pharmacy:

## 2021-09-12 ENCOUNTER — Telehealth: Payer: Self-pay | Admitting: *Deleted

## 2021-09-12 NOTE — Telephone Encounter (Signed)
  Coordinate care- [facility]- Managed  care Calling for assessment of care needed. They are working with the family to get assistance needed. Information given on patient visits and recent diagnosis. Patient is due annual exam soon.  NP at neurology office- seizures in control- but family does not think under control. Advised they should follow up with neurology because patient may need medication adjustment.  Also need to follow up for annual exam if office with PCP- no mention of increased seizure activity with recent visits.

## 2021-09-13 ENCOUNTER — Other Ambulatory Visit: Payer: Self-pay | Admitting: Physician Assistant

## 2021-09-13 ENCOUNTER — Telehealth (INDEPENDENT_AMBULATORY_CARE_PROVIDER_SITE_OTHER): Payer: Self-pay | Admitting: Family

## 2021-09-13 DIAGNOSIS — G479 Sleep disorder, unspecified: Secondary | ICD-10-CM

## 2021-09-13 NOTE — Telephone Encounter (Signed)
Spoke with Gabriel Cirri she states that Camyra has recently lost her father and wants her to me checked out by tina to see if she is ok. Mom would like tina to giver her a call. And is requesting a prescription for a monitor.

## 2021-09-15 ENCOUNTER — Other Ambulatory Visit: Payer: Self-pay | Admitting: Physician Assistant

## 2021-09-15 DIAGNOSIS — M62838 Other muscle spasm: Secondary | ICD-10-CM

## 2021-09-16 ENCOUNTER — Telehealth: Payer: Self-pay | Admitting: Physician Assistant

## 2021-09-16 NOTE — Telephone Encounter (Signed)
I left a message for Joanna Robinson and invited her to call back. TG

## 2021-10-02 NOTE — Progress Notes (Signed)
Joanna Robinson   MRN:  [id number]  1981/02/12   Provider: Rockwell Germany NP-C Location of Care: [facility]  Visit type: Return visit  Last visit: 04/01/2021  Referral source: [name], PA-C  History from: Epic chart and patient's mother  Brief history:  Copied from previous record: History of undifferentiated autism and complex partial seizures, as well as tic-like abnormal movements.  Today's concerns: Boston is seen today in follow up for autism and seizures. The seizures occur in conjunction with her menstrual period and usually consist of eye fluttering or twitching behaviors. She has not experienced convulsive seizures for many years. Mom said that [name] is being evaluated for caregiver assistance through [employer] and that the caseworker wants her to have a monitor for seizures. She had questions about that since [name] only has partial seizures during her menstrual period.   [name] has intermittent involuntary jerking movements of her head and neck, upper body and arms. These behaviors are likely tics and are not epileptic. These tics do not require any intervention.   [name] continues to be self directed in her behavior. She is able to dress herself when her mother helps her to choose appropriate clothing for the temperature or activities of the day. She is able to do some bathing but needs supervision and assistance. She is very slow with movements and cannot be hurried by her caregiver, as she has no concept of time or safety. [name] walks very slowly and while she can navigate around her home, needs a wheelchair to go out to public places such as stores, restaurants, medical appointments etc. Without a wheelchair, [name] may stop walking and stand in one place indefinitely, regardless of safety concerns such as traffic or crowds.  [name] needs to have foods prepared for her but she can feed herself. She needs to have  medication administered to her.   Mom reports that [name] grandfather passed away unexpectedly in July 20, 2022. She is concerned about her ability to process the loss, as he was not only her grandfather but a caregiver. She said that [name] has seemed to understand that he is in heaven but has not seemed to be sad or grieving.   [name] has been otherwise generally healthy since she was last seen. Mom has no other health concerns for her today other than previously mentioned.  Review of systems: Please see HPI for neurologic and other pertinent review of systems. Otherwise all other systems were reviewed and were negative.  Problem List: Patient Active Problem List   Diagnosis Date Noted   Dark stools 07/13/2019   Abnormal blood chemistry 12/20/2014   Abscess of axilla 12/20/2014   Dry eye syndrome 12/20/2014   Dermoid inclusion cyst 12/20/2014   Hematoma 12/20/2014   HLD (hyperlipidemia) 12/20/2014   Decreased potassium in the blood 12/20/2014   Irregular bleeding 12/20/2014   Leukopenia 12/20/2014   Malaise and fatigue 12/20/2014   Headache 12/18/2014   Acne 10/30/2014   Constipation 09/18/2014   Neutropenia ([place]) 09/18/2014   Elevated carbamazepine level 01/25/2014   Partial epilepsy with impairment of consciousness ([place]) 01/10/2013   Active autistic disorder 01/10/2013   Long-term use of high-risk medication 01/10/2013   Dysmenorrhea 07/10/2008   Mechanical and motor problems with internal organs 11/08/2007   Allergic rhinitis 08/20/2007   External hemorrhoids without complication 123456   Edema 12/26/2003   Epilepsy, not refractory ([place]) 12/26/2003   Intellectual disability 12/26/2003     Past Medical History:  Diagnosis Date  Autism    Epilepsy (HCC)    Hemorrhoid    Hyperlipidemia    Seizures (HCC)     Past medical history comments: See HPI  Surgical history: Past Surgical History:  Procedure Laterality Date   NO PAST SURGERIES       Family  history: family history includes Alzheimer's disease in her maternal grandmother and paternal grandfather.   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    She has a nurse that comes to her home and they go out on field trips. (2 days a week)    She enjoys exercise, dance, and riding bike. She lives with her mother.   Social Determinants of Health   Financial Resource Strain: Not on file  Food Insecurity: Not on file  Transportation Needs: Not on file  Physical Activity: Not on file  Stress: Not on file  Social Connections: Not on file  Intimate Partner Violence: Not on file    Past/failed meds: Copied from previous record: Carbamazepine  Allergies: Allergies  Allergen Reactions   Codeine    Corticosteroids Nausea Only    Immunizations: Immunization History  Administered Date(s) Administered   Influenza,inj,Quad PF,6+ Mos 01/07/2013, 01/05/2018, 02/07/2019, 12/30/2019, 01/31/2021   PFIZER(Purple Top)SARS-COV-2 Vaccination 08/16/2019, 09/06/2019, 03/09/2020   Td 12/20/2004   Tdap 08/16/2010    Diagnostics/Screenings:  Physical Exam: BP 100/60   Pulse 68   Resp 20   Wt 164 lb 12.8 oz (74.8 kg)   BMI 25.06 kg/m   General: Well developed, well nourished woman, seated, in no evident distress Head: Head normocephalic and atraumatic.  Oropharynx benign. Neck: Supple Cardiovascular: Regular rate and rhythm, no murmurs Respiratory: Breath sounds clear to  auscultation Musculoskeletal: No obvious deformities or scoliosis Skin: No rashes or neurocutaneous lesions  Neurologic Exam Mental Status: Awake and fully alert. Very limited language, as echolalia. Very little eye contact. Has intermittent involuntary movements of her head and arms, and makes occasional vocalizations. She is cooperative with very simple commands but is slow to respond when asked to perform a task. Cranial Nerves: Fundoscopic exam reveals red reflex. Pupils equal, briskly reactive to light.  Face tongue, palate move normally and symmetrically.  Motor: Normal functional bulk, tone and strength Sensory: Withdrawal x 4 Coordination: Unable to adequately assess due to her inability to participate in examination. No dysmetria when reaching for objects. Gait and Station: Arises from chair without difficulty.  Stance is normal. Gait demonstrates fairly normal stride length and balance but will not walk on command. Stops and stands when she wants to do so. Reflexes: Unable to adequately assess due to her inability to participate in examination.  Impression: Partial epilepsy with impairment of consciousness (HCC)  Active autistic disorder  Intellectual disability   Recommendations for plan of care: The patient's previous Epic records were reviewed. Sheina has neither had nor required imaging or lab  studies since the last visit, other than what has been performed by her PCP. Mom is aware of those results. She is doing well on Carbatrol and has only brief partial seizures during her menstrual period. These do not require intervention. She requires ongoing supervision and is unable to be left alone. Her grandfather used to help care for her but he passed away in 07/01/2022. Mom is applying for caregiver help for Melesa and has questions about the process. Mom needs a letter regarding Jaqlyn's condition, which I will write and mail to her. Mom had questions about Tyonna's ability to  understand and process grief for her grandfather, and we talked about the limitations of Maggi's ability for abstract thinking and emotions as part of her emotions. She has concrete thinking and has likely accepted the absence of her grandfather in the terms she has been given (that he is now in heaven). Mom was relieved as she was concerned that Joseline was dealing with grief that she was unable to verbalize.   I will see Cleta in follow up in 1 year or sooner if needed. Mom agreed with the plans made today.  The medication list was reviewed and reconciled. No changes were made in the prescribed medications today. A complete medication list was provided to the patient.  Return in about 1 year (around 10/04/2022).   Allergies as of 10/03/2021       Reactions   Codeine    Corticosteroids Nausea Only        Medication List        Accurate as of October 03, 2021 11:59 PM. If you have any questions, ask your nurse or doctor.          STOP taking these medications    Azelastine-Fluticasone 137-50 MCG/ACT Susp Stopped by: Elveria Rising, NP   furosemide 20 MG tablet Commonly known as: LASIX Stopped by: Elveria Rising, NP   hydrocortisone 2.5 % rectal cream Commonly known as: ANUSOL-HC Stopped by: Elveria Rising, NP   polyethylene glycol powder 17 GM/SCOOP powder Commonly known as: GLYCOLAX/MIRALAX Stopped by: Elveria Rising, NP       TAKE these medications    acetaZOLAMIDE 250 MG tablet Commonly known as: DIAMOX TAKE 1 TABLET BY MOUTH TWICE DAILY   azelastine 0.05 % ophthalmic solution Commonly known as: OPTIVAR PLACE 1 DROP IN BOTH EYES TWICE DAILY   baclofen 10 MG tablet Commonly known as: LIORESAL TAKE 1 TABLET BY MOUTH TWICE DAILY   benzoyl peroxide-erythromycin gel Commonly known as: BENZAMYCIN APPLY TO AFFECTED AREA(s) TWICE DAILY ASDIRECTED   brompheniramine-pseudoephedrine-DM 30-2-10 MG/5ML syrup Take 5 mLs by mouth 4 (four) times daily  as needed.   Carbatrol 200 MG 12 hr capsule Generic drug: carbamazepine TAKE 1 CAPSULE BY MOUTH ONCE EVERY MORNING AND 1 CAPSULE EVERY EVENING   cloNIDine 0.2 MG tablet Commonly known as: CATAPRES TAKE 1 TABLET BY MOUTH AT BEDTIME   ferrous gluconate 324 MG tablet Commonly known as: FERGON TAKE 1 TABLET BY MOUTH TWICE DAILY WITH MEALS   fexofenadine 180 MG tablet Commonly known as: ALLEGRA TAKE 1 TABLET BY MOUTH ONCE DAILY   fluticasone 50 MCG/ACT nasal spray Commonly known as: FLONASE Place into both nostrils.   fluvoxaMINE 100 MG tablet Commonly known as: LUVOX TAKE 1 TABLET BY MOUTH TWICE DAILY   GNP Mucus ER 600 MG 12 hr tablet Generic drug: guaiFENesin TAKE 1 TABLET BY MOUTH TWICE DAILY FOR 15 DAYS   hydrochlorothiazide 25 MG tablet Commonly known as: HYDRODIURIL  TAKE 1 TABLET BY MOUTH ONCE DAILY   Linzess 145 MCG Caps capsule Generic drug: linaclotide TAKE 1 CAPSULE BY MOUTH ONCE DAILY   Mapap 500 MG tablet Generic drug: acetaminophen TAKE 2 TABLETS BY MOUTH 3 TIMES DAILY ASNEEDED HEADACHE   montelukast 10 MG tablet Commonly known as: SINGULAIR TAKE 1 TABLET BY MOUTH ONCE EVERY EVENING   naproxen 500 MG tablet Commonly known as: NAPROSYN TAKE 1 TABLET BY MOUTH TWICE DAILY   omeprazole 40 MG capsule Commonly known as: PRILOSEC TAKE 1 CAPSULE BY MOUTH ONCE DAILY   potassium chloride 20 MEQ packet Commonly known as: KLOR-CON DISSOLVE 1 PACKET IN 4OZ OF LIQUID AND DRINK ONCE DAILY   sodium fluoride 1.1 % Crea dental cream Commonly known as: Denta 5000 Plus Place 1 application onto teeth every evening. Brush teeth daily for 1 minute, rinse and spit.   sulfacetamide 10 % ophthalmic solution Commonly known as: BLEPH-10 PLACE 2 DROPS IN RIGHT EYE 4 TIMES DAILY   triamcinolone cream 0.1 % Commonly known as: KENALOG APPLY TO AFFECTED AREA(s) TWICE DAILY ASDIRECTED      Total time spent with the patient was 30 minutes, of which 50% or more was spent  in counseling and coordination of care.  Rockwell Germany NP-C [facility] Ph. [phone] Fax [phone]

## 2021-10-03 ENCOUNTER — Ambulatory Visit (INDEPENDENT_AMBULATORY_CARE_PROVIDER_SITE_OTHER): Payer: Medicare Other | Admitting: Family

## 2021-10-03 ENCOUNTER — Encounter (INDEPENDENT_AMBULATORY_CARE_PROVIDER_SITE_OTHER): Payer: Self-pay | Admitting: Family

## 2021-10-03 VITALS — BP 100/60 | HR 68 | Resp 20 | Wt 164.8 lb

## 2021-10-03 DIAGNOSIS — G40209 Localization-related (focal) (partial) symptomatic epilepsy and epileptic syndromes with complex partial seizures, not intractable, without status epilepticus: Secondary | ICD-10-CM

## 2021-10-03 DIAGNOSIS — F840 Autistic disorder: Secondary | ICD-10-CM | POA: Diagnosis not present

## 2021-10-03 DIAGNOSIS — F79 Unspecified intellectual disabilities: Secondary | ICD-10-CM | POA: Diagnosis not present

## 2021-10-03 NOTE — Patient Instructions (Signed)
It was a pleasure to see you today!  Instructions for you until your next appointment are as follows: Continue Shifa's medications as you have been giving them I will write a letter and mail it to you for the [employer] application Please sign up for MyChart if you have not done so. Please plan to return for follow up in one year or sooner if needed.   Feel free to contact our office during normal business hours at [phone] with questions or concerns. If there is no answer or the call is outside business hours, please leave a message and our clinic staff will call you back within the next business day.  If you have an urgent concern, please stay on the line for our after-hours answering service and ask for the on-call neurologist.     I also encourage you to use MyChart to communicate with me more directly. If you have not yet signed up for MyChart within [facility], the front desk staff can help you. However, please note that this inbox is NOT monitored on nights or weekends, and response can take up to 2 business days.  Urgent matters should be discussed with the on-call pediatric neurologist.   At [facility], we are committed to providing exceptional care. You will receive a patient satisfaction survey through text or email regarding your visit today. Your opinion is important to me. Comments are appreciated.

## 2021-10-04 ENCOUNTER — Telehealth (INDEPENDENT_AMBULATORY_CARE_PROVIDER_SITE_OTHER): Payer: Self-pay | Admitting: Family

## 2021-10-04 NOTE — Telephone Encounter (Signed)
  Name of who is calling: [name]   Caller's Relationship to Patient: [name]  Best contact number: [phone]  Provider they see: [name]  Reason for call: [name] will be sending over a fax for Medical records for [name] and recent increase on medicine for seizures that have also increased.      PRESCRIPTION REFILL ONLY  Name of prescription:  Pharmacy:

## 2021-10-06 ENCOUNTER — Encounter (INDEPENDENT_AMBULATORY_CARE_PROVIDER_SITE_OTHER): Payer: Self-pay | Admitting: Family

## 2021-10-07 ENCOUNTER — Telehealth (INDEPENDENT_AMBULATORY_CARE_PROVIDER_SITE_OTHER): Payer: Self-pay | Admitting: Family

## 2021-10-07 NOTE — Telephone Encounter (Signed)
  Name of who is calling: [name] W/ Via Medicaid NCO for [place]  Caller's Relationship to Patient:  Best contact number: [phone]  Provider they see: Inetta Fermo  Reason for call: Faxed in a request for information for this patient and has yet to get any information. Please call      PRESCRIPTION REFILL ONLY  Name of prescription:  Pharmacy:

## 2021-10-08 ENCOUNTER — Encounter (INDEPENDENT_AMBULATORY_CARE_PROVIDER_SITE_OTHER): Payer: Self-pay | Admitting: Family

## 2021-10-08 NOTE — Telephone Encounter (Signed)
The form was received today and notes faxed as requested. TG

## 2021-10-08 NOTE — Telephone Encounter (Signed)
Returned call paperwork received and will send requested info today

## 2021-10-10 ENCOUNTER — Other Ambulatory Visit: Payer: Self-pay | Admitting: Physician Assistant

## 2021-10-10 ENCOUNTER — Telehealth: Payer: Self-pay | Admitting: Physician Assistant

## 2021-10-10 ENCOUNTER — Telehealth (INDEPENDENT_AMBULATORY_CARE_PROVIDER_SITE_OTHER): Payer: Self-pay | Admitting: Family

## 2021-10-10 NOTE — Telephone Encounter (Signed)
Copied from CRM [id number]. Topic: Medicare AWV >> Oct 10, 2021  2:41 PM Zannie Kehr wrote: Reason for CRM:  Left message for patient to call back and schedule Medicare Annual Wellness Visit (AWV) in office.   If unable to come into the office for AWV,  please offer to do virtually or by telephone.  Last AWV: 12/30/2019  Please schedule at anytime with [facility] Health Advisor.  30 minute appointment for Virtual or phone 45 minute appointment for in office or Initial virtual/phone  Any questions, please contact me at [phone]

## 2021-10-10 NOTE — Telephone Encounter (Signed)
I returned call to Alma Friendly requesting he call back to see if he is with a company or where he is calling from. Per mother's request, do not release any information regarding patient to a caller without her consent. Rufina Falco

## 2021-10-10 NOTE — Telephone Encounter (Signed)
  Name of who is calling:Daniel Bear   Caller's Relationship to Patient:  Best contact number:[phone]  Provider they [facility]   Reason for call:Daniel Bear left a VM needing a call back with information regarding Deion's medication list. The number listed is a cell phone she said can be called back at anytime     PRESCRIPTION REFILL ONLY  Name of prescription:  Pharmacy:

## 2021-10-10 NOTE — Telephone Encounter (Signed)
Medication Refill - Medication:fluticasone (FLONASE) 50 MCG/ACT nasal spray  Has the patient contacted their pharmacy? Yes.     Preferred Pharmacy (with phone number or street name):  [facility] [place], [place] - [address] Phone:  [phone]  Fax:  [phone]     Has the patient been seen for an appointment in the last year OR does the patient have an upcoming appointment? Yes.

## 2021-10-10 NOTE — Telephone Encounter (Signed)
Call to Sharion Dove and advised Alma Friendly is calling about Joanna Robinson's medication list. RN did not call back because it does not say who he works with and there is not a consent on file for that name. She states "No" do not give them any information  they can contact her to obtain the information.

## 2021-10-11 ENCOUNTER — Other Ambulatory Visit: Payer: Self-pay | Admitting: Physician Assistant

## 2021-10-11 NOTE — Telephone Encounter (Signed)
Medication Refill - Medication: fluticasone (FLONASE) 50 MCG/ACT nasal spray [[id number]]   Has the patient contacted their pharmacy? Yes.   (Agent: If no, request that the patient contact the pharmacy for the refill. If patient does not wish to contact the pharmacy document the reason why and proceed with request.) (Agent: If yes, when and what did the pharmacy advise?)  Preferred Pharmacy (with phone number or street name):  [facility] [place], [place] - [address]  [address] [place] [address]  Phone: [phone] Fax: [phone]  Hours: Not open 24 hours   Has the patient been seen for an appointment in the last year OR does the patient have an upcoming appointment? Yes.    Agent: Please be advised that RX refills may take up to 3 business days. We ask that you follow-up with your pharmacy.

## 2021-10-11 NOTE — Telephone Encounter (Signed)
Requested medication (s) are due for refill today: -  Requested medication (s) are on the active medication list: historical med  Last refill:  ? 10/03/21   Future visit scheduled: yes  Notes to clinic:  historical provider   Requested Prescriptions  Pending Prescriptions Disp Refills   fluticasone (FLONASE) 50 MCG/ACT nasal spray      Sig: Place into both nostrils.     Ear, Nose, and Throat: Nasal Preparations - Corticosteroids Passed - 10/10/2021  1:13 PM      Passed - Valid encounter within last 12 months    Recent Outpatient Visits           5 months ago Subacute frontal sinusitis   [facility] Caro Laroche, DO   6 months ago Pre-op evaluation   [facility] Caro Laroche, DO   8 months ago Elevated carbamazepine level   [facility] [place], [place], PA-C   12 months ago Pure hypercholesterolemia   [facility], Marzella Schlein, MD   1 year ago Neck pain   [facility], Alessandra Bevels, [place]       Future Appointments             In 1 week [name], PA-C [facility]

## 2021-10-11 NOTE — Telephone Encounter (Signed)
Requested medication (s) are due for refill today: unclear, historical provider rx missing details  Requested medication (s) are on the active medication list: yes  Last refill:  05/28/21  Future visit scheduled: 10/23/21, seen 05/01/21  Notes to clinic:  Historical Provider, please assess.       Requested Prescriptions  Pending Prescriptions Disp Refills   fluticasone (FLONASE) 50 MCG/ACT nasal spray      Sig: Place into both nostrils.     Ear, Nose, and Throat: Nasal Preparations - Corticosteroids Passed - 10/11/2021 11:17 AM      Passed - Valid encounter within last 12 months    Recent Outpatient Visits           5 months ago Subacute frontal sinusitis   [facility] Caro Laroche, DO   6 months ago Pre-op evaluation   [facility] Caro Laroche, DO   8 months ago Elevated carbamazepine level   [facility] [place], [place], PA-C   12 months ago Pure hypercholesterolemia   [facility], Marzella Schlein, MD   1 year ago Neck pain   [facility] [place], Alessandra Bevel[place], [place]       Future Appointments             In 1 week [name], PA-C [facility]

## 2021-10-15 ENCOUNTER — Telehealth: Payer: Self-pay

## 2021-10-15 NOTE — Telephone Encounter (Signed)
Copied from CRM [id number]. Topic: General - Other >> Oct 15, 2021  3:28 PM Lyman Speller wrote: Reason for CRM: Pt is having dental surgery with [facility] and they are waiting to put her on the list for surgery for 9.26.23./ they have been waiting on this appt for suregry for two years and dont want to be rescheduled / the Dentist Katheren Shams has sent over a surgery clearance and have not heard anything back yet / pts mom states this is urgent and they need this asap / office phone # [phone]

## 2021-10-17 ENCOUNTER — Ambulatory Visit (INDEPENDENT_AMBULATORY_CARE_PROVIDER_SITE_OTHER): Payer: Medicare Other

## 2021-10-17 VITALS — Wt 164.0 lb

## 2021-10-17 DIAGNOSIS — Z0000 Encounter for general adult medical examination without abnormal findings: Secondary | ICD-10-CM | POA: Diagnosis not present

## 2021-10-17 NOTE — Progress Notes (Signed)
Virtual Visit via Telephone Note  I connected with  Judi Saa on 10/17/21 at  8:30 AM EDT by telephone and verified that I am speaking with the correct person using two identifiers. Also spoke w/ [place].   Location: Patient: home Provider: [facility] Persons participating in the virtual visit: patient/Nurse Health Advisor   I discussed the limitations, risks, security and privacy concerns of performing an evaluation and management service by telephone and the availability of in person appointments. The patient expressed understanding and agreed to proceed.  Interactive audio and video telecommunications were attempted between this nurse and patient, however failed, due to patient having technical difficulties OR patient did not have access to video capability.  We continued and completed visit with audio only.  Some vital signs may be absent or patient reported.   Hal Hope, LPN  Subjective:   MURL ZOGG is a 41 y.o. female who presents for Medicare Annual (Subsequent) preventive examination.  Review of Systems           Objective:    There were no vitals filed for this visit. There is no height or weight on file to calculate BMI.      No data to display          Current Medications (verified) Outpatient Encounter Medications as of 10/17/2021  Medication Sig   acetaZOLAMIDE (DIAMOX) 250 MG tablet TAKE 1 TABLET BY MOUTH TWICE DAILY   azelastine (OPTIVAR) 0.05 % ophthalmic solution PLACE 1 DROP IN BOTH EYES TWICE DAILY   baclofen (LIORESAL) 10 MG tablet TAKE 1 TABLET BY MOUTH TWICE DAILY   benzoyl peroxide-erythromycin (BENZAMYCIN) gel APPLY TO AFFECTED AREA(s) TWICE DAILY ASDIRECTED   brompheniramine-pseudoephedrine-DM 30-2-10 MG/5ML syrup Take 5 mLs by mouth 4 (four) times daily as needed.   CARBATROL 200 MG 12 hr capsule TAKE 1 CAPSULE BY MOUTH ONCE EVERY MORNING AND 1 CAPSULE EVERY EVENING   cloNIDine (CATAPRES) 0.2 MG tablet TAKE 1 TABLET BY MOUTH AT  BEDTIME   ferrous gluconate (FERGON) 324 MG tablet TAKE 1 TABLET BY MOUTH TWICE DAILY WITH MEALS   fexofenadine (ALLEGRA) 180 MG tablet TAKE 1 TABLET BY MOUTH ONCE DAILY   fluticasone (FLONASE) 50 MCG/ACT nasal spray Place into both nostrils.   fluvoxaMINE (LUVOX) 100 MG tablet TAKE 1 TABLET BY MOUTH TWICE DAILY   hydrochlorothiazide (HYDRODIURIL) 25 MG tablet TAKE 1 TABLET BY MOUTH ONCE DAILY   LINZESS 145 MCG CAPS capsule TAKE 1 CAPSULE BY MOUTH ONCE DAILY   MAPAP 500 MG tablet TAKE 2 TABLETS BY MOUTH 3 TIMES DAILY ASNEEDED HEADACHE   montelukast (SINGULAIR) 10 MG tablet TAKE 1 TABLET BY MOUTH ONCE EVERY EVENING   naproxen (NAPROSYN) 500 MG tablet TAKE 1 TABLET BY MOUTH TWICE DAILY   omeprazole (PRILOSEC) 40 MG capsule TAKE 1 CAPSULE BY MOUTH ONCE DAILY   potassium chloride (KLOR-CON) 20 MEQ packet DISSOLVE 1 PACKET IN 4OZ OF LIQUID AND DRINK ONCE DAILY   sodium fluoride (DENTA 5000 PLUS) 1.1 % CREA dental cream Place 1 application onto teeth every evening. Brush teeth daily for 1 minute, rinse and spit.   sulfacetamide (BLEPH-10) 10 % ophthalmic solution PLACE 2 DROPS IN RIGHT EYE 4 TIMES DAILY   triamcinolone cream (KENALOG) 0.1 % APPLY TO AFFECTED AREA(s) TWICE DAILY ASDIRECTED   GNP MUCUS ER 600 MG 12 hr tablet TAKE 1 TABLET BY MOUTH TWICE DAILY FOR 15 DAYS (Patient not taking: Reported on 10/03/2021)   No facility-administered encounter medications on file as of  10/17/2021.    Allergies (verified) Codeine and Corticosteroids   History: Past Medical History:  Diagnosis Date   Autism    Epilepsy (HCC)    Hemorrhoid    Hyperlipidemia    Seizures (HCC)    Past Surgical History:  Procedure Laterality Date   NO PAST SURGERIES     Family History  Problem Relation Age of Onset   Alzheimer's disease Maternal Grandmother        Died at 60   Alzheimer's disease Paternal Grandfather        Died at 52   Social History   Socioeconomic History   Marital status: Single    Spouse  name: Not on file   Number of children: Not on file   Years of education: Not on file   Highest education level: Not on file  Occupational History   Not on file  Tobacco Use   Smoking status: Never   Smokeless tobacco: Never  Vaping Use   Vaping Use: Never used  Substance and Sexual Activity   Alcohol use: No   Drug use: No   Sexual activity: Never  Other Topics Concern   Not on file  Social History Narrative   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    She has a nurse that comes to her home and they go out on field trips. (2 days a week)    She enjoys exercise, dance, and riding bike. She lives with her mother.   Social Determinants of Health   Financial Resource Strain: Not on file  Food Insecurity: Not on file  Transportation Needs: Not on file  Physical Activity: Insufficiently Active (10/17/2021)   Exercise Vital Sign    Days of Exercise per Week: 2 days    Minutes of Exercise per Session: 30 min  Stress: No Stress Concern Present (10/17/2021)   [employer] of Occupational Health - Occupational Stress Questionnaire    Feeling of Stress : Not at all  Social Connections: Not on file    Tobacco Counseling Counseling given: Not Answered   Clinical Intake:  Pre-visit preparation completed: No  Pain : No/denies pain     Nutritional Risks: None Diabetes: No  How often do you need to have someone help you when you read instructions, pamphlets, or other written materials from your doctor or pharmacy?: 1 - Never  Diabetic?no  Interpreter Needed?: No  Information entered by :: Kennedy Bucker, LPN   Activities of Daily Living     No data to display          Patient Care Team: [name], PA-C as PCP - General (Physician Assistant) Kieth Brightly, MD (General Surgery) Lorie Phenix, MD as Referring Physician (Family Medicine) Elveria Rising, NP as Nurse Practitioner (Neurology)  Indicate any recent Medical Services you may have  received from other than [facility] providers in the past year (date may be approximate).     Assessment:   This is a routine wellness examination for Joanna Robinson.  Hearing/Vision screen No results found.  Dietary issues and exercise activities discussed:     Goals Addressed             This Visit's Progress    DIET - EAT MORE FRUITS AND VEGETABLES         Depression Screen    10/17/2021    8:25 AM 12/30/2019    7:22 PM 12/17/2018   11:25 AM 09/29/2018   10:56 AM  PHQ 2/9 Scores  PHQ - 2  Score 0 0 0 0  PHQ- 9 Score 0 3      Fall Risk    12/30/2019    7:22 PM 12/17/2018   11:25 AM 09/29/2018   10:57 AM  Fall Risk   Falls in the past year? 0 0 0  Number falls in past yr: 0 0   Injury with Fall? 0 0     FALL RISK PREVENTION PERTAINING TO THE HOME:  Any stairs in or around the home? No  If so, are there any without handrails? No  Home free of loose throw rugs in walkways, pet beds, electrical cords, etc? Yes  Adequate lighting in your home to reduce risk of falls? Yes   ASSISTIVE DEVICES UTILIZED TO PREVENT FALLS:  Life alert? No  Use of a cane, walker or w/c? No  Grab bars in the bathroom? No  Shower chair or bench in shower? Yes  Elevated toilet seat or a handicapped toilet? No    Cognitive Function:unable        Immunizations Immunization History  Administered Date(s) Administered   Influenza,inj,Quad PF,6+ Mos 01/07/2013, 01/05/2018, 02/07/2019, 12/30/2019, 01/31/2021   PFIZER(Purple Top)SARS-COV-2 Vaccination 08/16/2019, 09/06/2019, 03/09/2020   Td 12/20/2004   Tdap 08/16/2010    TDAP status: Due, Education has been provided regarding the importance of this vaccine. Advised may receive this vaccine at local pharmacy or Health Dept. Aware to provide a copy of the vaccination record if obtained from local pharmacy or Health Dept. Verbalized acceptance and understanding.  Flu Vaccine status: Up to date  Pneumococcal vaccine status: Declined,  Education has  been provided regarding the importance of this vaccine but patient still declined. Advised may receive this vaccine at local pharmacy or Health Dept. Aware to provide a copy of the vaccination record if obtained from local pharmacy or Health Dept. Verbalized acceptance and understanding.   Covid-19 vaccine status: Completed vaccines  Qualifies for Shingles Vaccine? No   Zostavax completed No   Shingrix Completed?: No.    Education has been provided regarding the importance of this vaccine. Patient has been advised to call insurance company to determine out of pocket expense if they have not yet received this vaccine. Advised may also receive vaccine at local pharmacy or Health Dept. Verbalized acceptance and understanding.  Screening Tests Health Maintenance  Topic Date Due   Hepatitis C Screening  Never done   COVID-19 Vaccine (4 - Booster for Pfizer series) 05/04/2020   TETANUS/TDAP  08/15/2020   PAP SMEAR-Modifier  09/29/2023 (Originally 10/16/2003)   INFLUENZA VACCINE  10/22/2021   HPV VACCINES  Aged Out   HIV Screening  Discontinued    Health Maintenance  Health Maintenance Due  Topic Date Due   Hepatitis C Screening  Never done   COVID-19 Vaccine (4 - Booster for Pfizer series) 05/04/2020   TETANUS/TDAP  08/15/2020    Lung Cancer Screening: (Low Dose CT Chest recommended if Age 28-80 years, 30 pack-year currently smoking OR have quit w/in 15years.) does not qualify.     Additional Screening:  Hepatitis C Screening: does qualify; Completed no  Vision Screening: Recommended annual ophthalmology exams for early detection of glaucoma and other disorders of the eye. Is the patient up to date with their annual eye exam?  No  Who is the provider or what is the name of the office in which the patient attends annual eye exams? No one If pt is not established with a provider, would they like to be referred to a  provider to establish care? No .   Dental Screening: Recommended  annual dental exams for proper oral hygiene  Community Resource Referral / Chronic Care Management: CRR required this visit?  No   CCM required this visit?  No      Plan:     I have personally reviewed and noted the following in the patient's chart:   Medical and social history Use of alcohol, tobacco or illicit drugs  Current medications and supplements including opioid prescriptions.  Functional ability and status Nutritional status Physical activity Advanced directives List of other physicians Hospitalizations, surgeries, and ER visits in previous 12 months Vitals Screenings to include cognitive, depression, and falls Referrals and appointments  In addition, I have reviewed and discussed with patient certain preventive protocols, quality metrics, and best practice recommendations. A written personalized care plan for preventive services as well as general preventive health recommendations were provided to patient.     Hal Hope, LPN   [id number]   Nurse Notes: none

## 2021-10-17 NOTE — Patient Instructions (Signed)
Appt next year is 7/29 @ 9:15 am by phone

## 2021-10-21 IMAGING — CR DG ABDOMEN 2V
1 series · 3 of 3 positions shown · non-contrast
Comparison: No prior.

CLINICAL DATA: Constipation.

EXAM:
ABDOMEN - 2 VIEW

[Series 1: dg abd 2 views · 0.14mm/px · 3 of 3 slices shown]
[im 1/3]
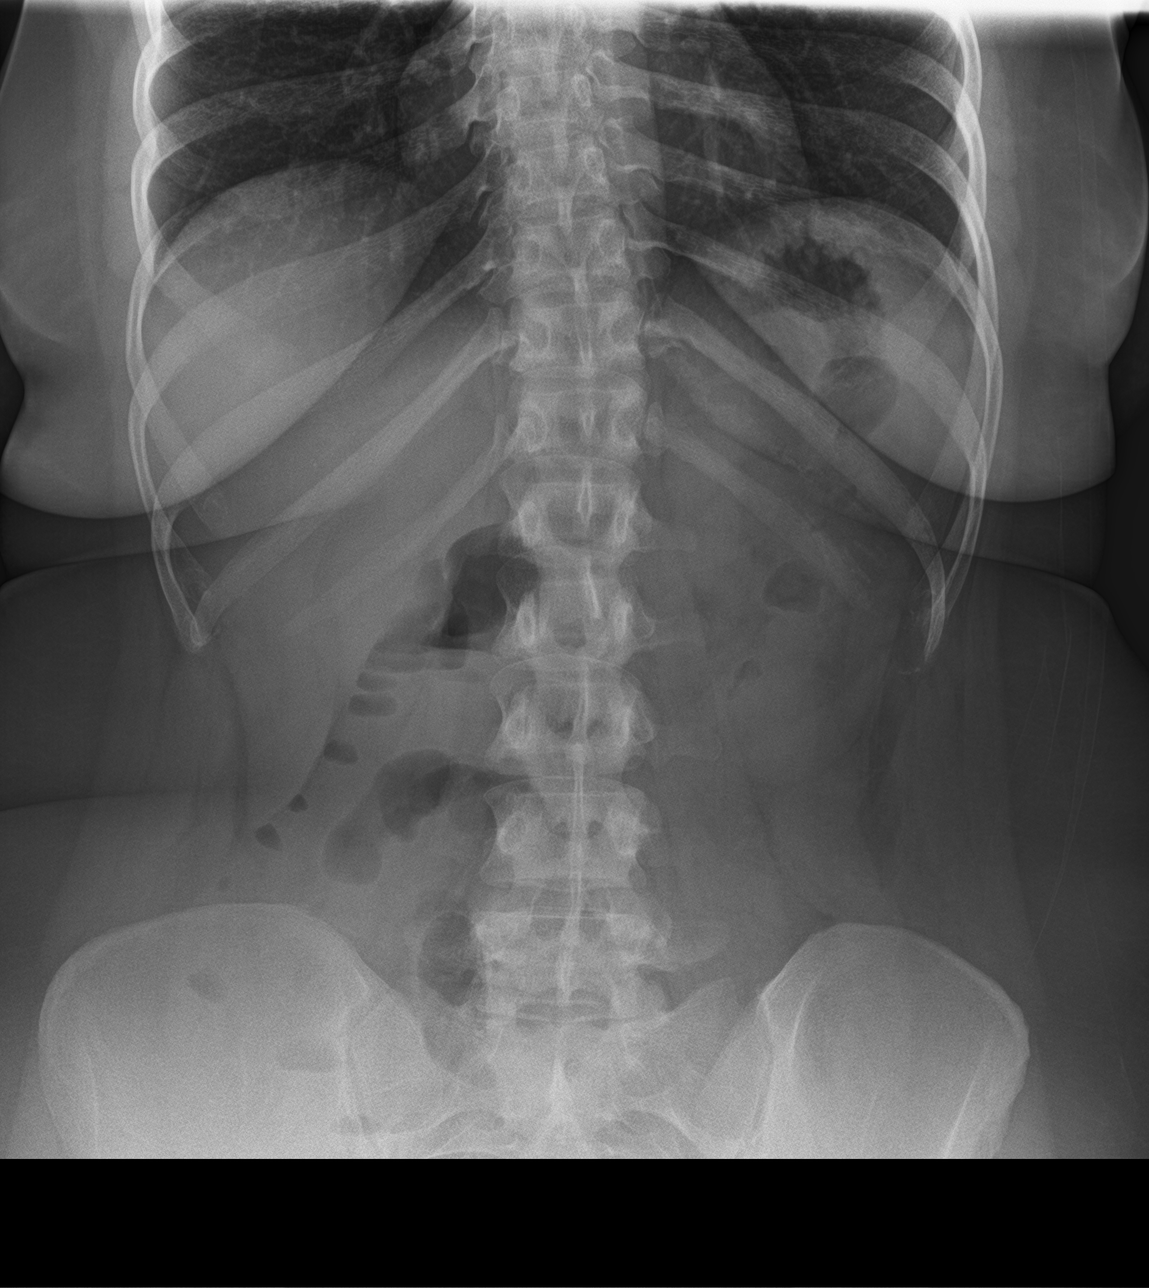
[im 2/3]
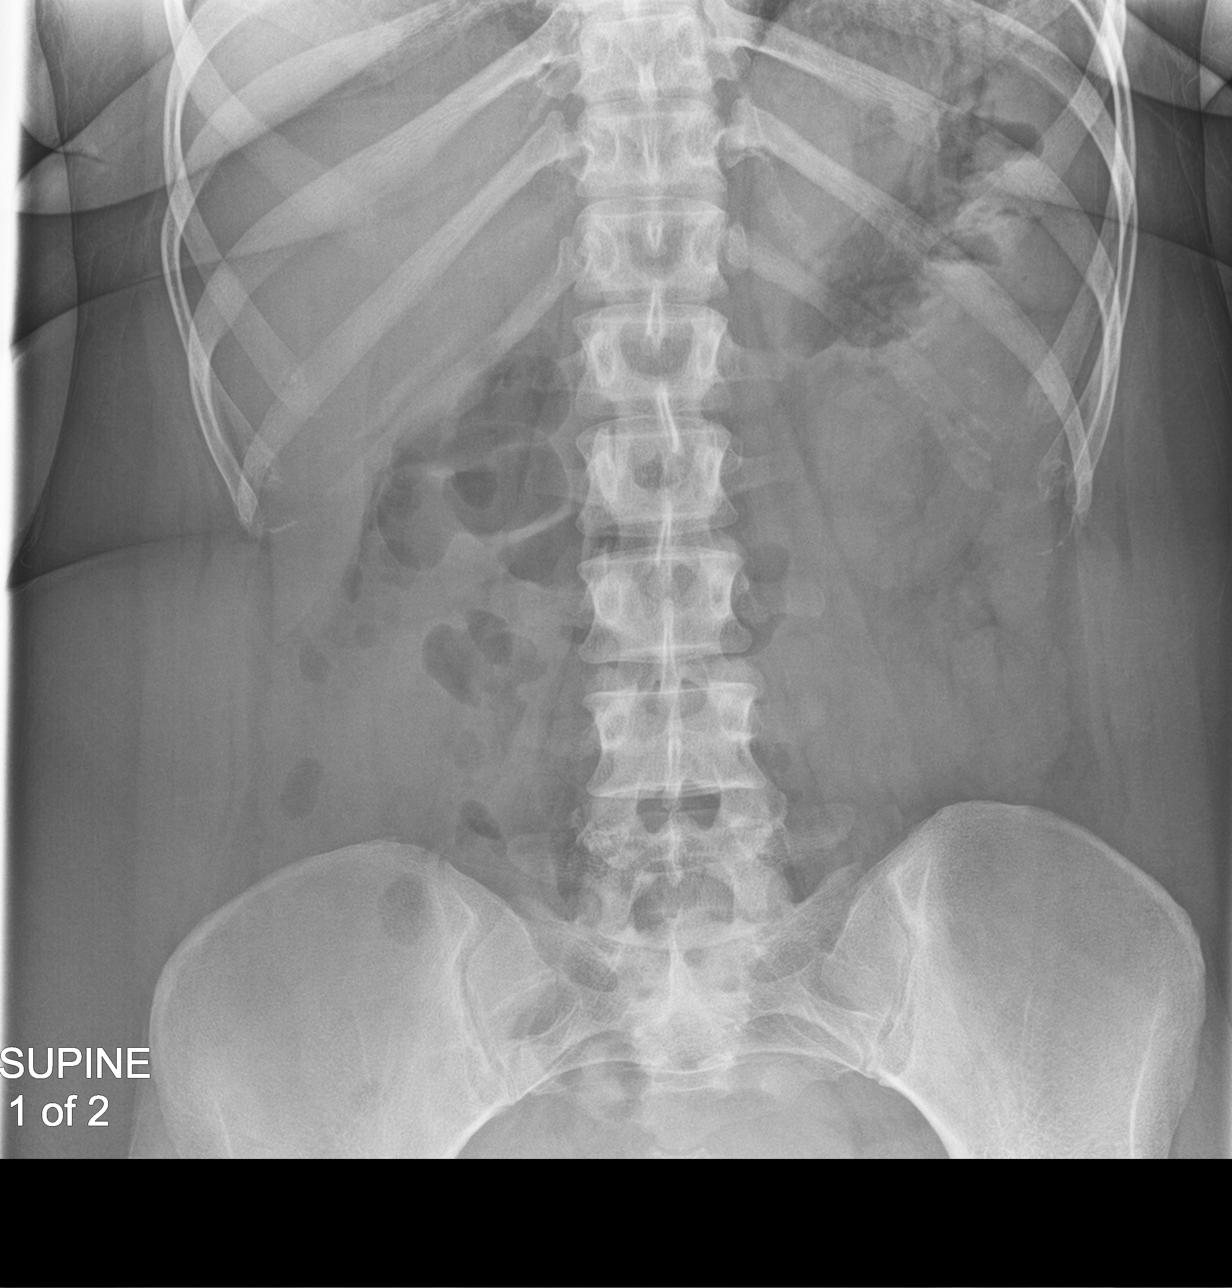
[im 3/3]
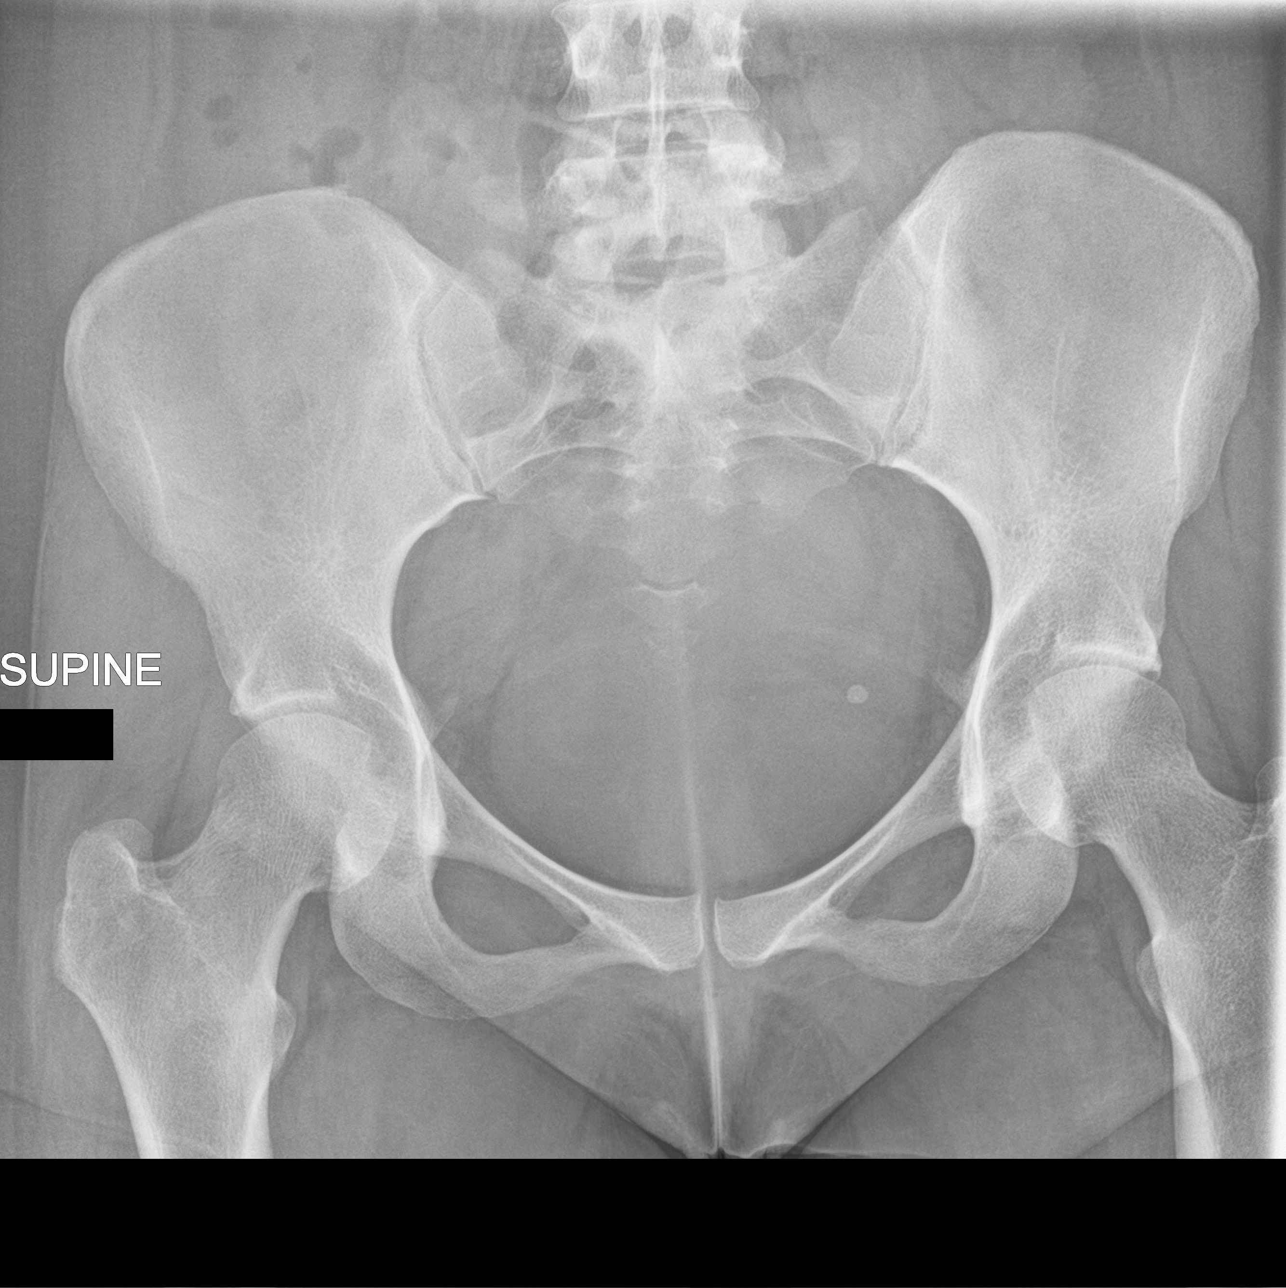

[3 of 3 positions shown; findings below may reference images not displayed]

FINDINGS: Soft tissue structures are unremarkable. No bowel distention or free
air. Pelvic calcifications consistent phleboliths. Thoracolumbar
spine scoliosis concave right. No acute bony abnormality identified.
IMPRESSION: No acute intra-abdominal abnormality identified.

## 2021-10-22 NOTE — Progress Notes (Unsigned)
I,Joanna Robinson,acting as a Neurosurgeon for [employer], PA-C.,have documented all relevant documentation on the behalf of Joanna Ferguson, PA-C,as directed by  Joanna Ferguson, PA-C while in the presence of Joanna Ferguson, PA-C.   Annual Wellness Visit     Patient: Joanna Robinson, Female    DOB: 06-13-1980, 41 y.o.   MRN: [id number] Visit Date: 10/23/2021  Today's Provider: [name], PA-C  Cc. cpe  Subjective    Joanna Robinson is a 41 y.o. female who presents today for her Annual Wellness Visit. She reports consuming a general diet.  The patient reports going walking twice a week for at least 20 minutes if possible.  She generally feels well. She reports sleeping fairly well. She does not have additional problems to discuss today.   Medications: Outpatient Medications Prior to Visit  Medication Sig   acetaZOLAMIDE (DIAMOX) 250 MG tablet TAKE 1 TABLET BY MOUTH TWICE DAILY   azelastine (OPTIVAR) 0.05 % ophthalmic solution PLACE 1 DROP IN BOTH EYES TWICE DAILY   baclofen (LIORESAL) 10 MG tablet TAKE 1 TABLET BY MOUTH TWICE DAILY   benzoyl peroxide-erythromycin (BENZAMYCIN) gel APPLY TO AFFECTED AREA(s) TWICE DAILY ASDIRECTED   brompheniramine-pseudoephedrine-DM 30-2-10 MG/5ML syrup Take 5 mLs by mouth 4 (four) times daily as needed.   CARBATROL 200 MG 12 hr capsule TAKE 1 CAPSULE BY MOUTH ONCE EVERY MORNING AND 1 CAPSULE EVERY EVENING   cloNIDine (CATAPRES) 0.2 MG tablet TAKE 1 TABLET BY MOUTH AT BEDTIME   ferrous gluconate (FERGON) 324 MG tablet TAKE 1 TABLET BY MOUTH TWICE DAILY WITH MEALS   fexofenadine (ALLEGRA) 180 MG tablet TAKE 1 TABLET BY MOUTH ONCE DAILY   fluticasone (FLONASE) 50 MCG/ACT nasal spray Place into both nostrils.   fluvoxaMINE (LUVOX) 100 MG tablet TAKE 1 TABLET BY MOUTH TWICE DAILY   GNP MUCUS ER 600 MG 12 hr tablet TAKE 1 TABLET BY MOUTH TWICE DAILY FOR 15 DAYS   hydrochlorothiazide (HYDRODIURIL) 25 MG tablet TAKE 1 TABLET BY MOUTH ONCE DAILY    LINZESS 145 MCG CAPS capsule TAKE 1 CAPSULE BY MOUTH ONCE DAILY   MAPAP 500 MG tablet TAKE 2 TABLETS BY MOUTH 3 TIMES DAILY ASNEEDED HEADACHE   montelukast (SINGULAIR) 10 MG tablet TAKE 1 TABLET BY MOUTH ONCE EVERY EVENING   naproxen (NAPROSYN) 500 MG tablet TAKE 1 TABLET BY MOUTH TWICE DAILY   omeprazole (PRILOSEC) 40 MG capsule TAKE 1 CAPSULE BY MOUTH ONCE DAILY   potassium chloride (KLOR-CON) 20 MEQ packet DISSOLVE 1 PACKET IN 4OZ OF LIQUID AND DRINK ONCE DAILY   sodium fluoride (DENTA 5000 PLUS) 1.1 % CREA dental cream Place 1 application onto teeth every evening. Brush teeth daily for 1 minute, rinse and spit.   sulfacetamide (BLEPH-10) 10 % ophthalmic solution PLACE 2 DROPS IN RIGHT EYE 4 TIMES DAILY   triamcinolone cream (KENALOG) 0.1 % APPLY TO AFFECTED AREA(s) TWICE DAILY ASDIRECTED   No facility-administered medications prior to visit.    Allergies  Allergen Reactions   Codeine    Corticosteroids Nausea Only    Patient Care Team: Joanna Ferguson, PA-C as PCP - General (Physician Assistant) Kieth Brightly, MD (General Surgery) Lorie Phenix, MD as Referring Physician (Family Medicine) Elveria Rising, NP as Nurse Practitioner (Neurology)  Review of Systems  HENT:  Positive for congestion, ear pain, sinus pressure, sneezing and sore throat.   Eyes:  Positive for redness.  Neurological:  Positive for seizures and headaches.  Hematological:  Bruises/bleeds easily.  Objective    Blood pressure 97/67, pulse 62, height 5\' 8"  (1.727 m), weight 158 lb 8 oz (71.9 kg), last menstrual period 10/23/2021, SpO2 100 %.    Physical Exam Constitutional:      General: She is awake.     Appearance: She is well-developed. She is not ill-appearing.     Comments: Pt is able to communicate, does not answer all direct questions but is responsive to questioning and cooperative to exam  HENT:     Head: Normocephalic.     Right Ear: Tympanic membrane normal.     Left Ear:  Tympanic membrane normal.     Nose: Nose normal. No congestion or rhinorrhea.     Mouth/Throat:     Pharynx: No oropharyngeal exudate or posterior oropharyngeal erythema.  Eyes:     Conjunctiva/sclera: Conjunctivae normal.     Pupils: Pupils are equal, round, and reactive to light.  Neck:     Thyroid: No thyroid mass or thyromegaly.  Cardiovascular:     Rate and Rhythm: Normal rate and regular rhythm.     Heart sounds: Normal heart sounds.  Pulmonary:     Effort: Pulmonary effort is normal.     Breath sounds: Normal breath sounds.  Abdominal:     Palpations: Abdomen is soft.     Tenderness: There is no abdominal tenderness.  Musculoskeletal:     Right lower leg: No swelling. No edema.     Left lower leg: No swelling. No edema.  Lymphadenopathy:     Cervical: No cervical adenopathy.  Skin:    General: Skin is warm.  Neurological:     Mental Status: She is alert and oriented to person, place, and time.  Psychiatric:        Attention and Perception: Attention normal.        Mood and Affect: Mood normal.        Speech: Speech normal.        Behavior: Behavior normal. Behavior is cooperative.    Most recent functional status assessment:    10/23/2021   11:32 AM  In your present state of health, do you have any difficulty performing the following activities:  Dressing or bathing? 1  Doing errands, shopping? 1   Most recent fall risk assessment:    10/23/2021   11:32 AM  Fall Risk   Falls in the past year? 0  Number falls in past yr: 0  Injury with Fall? 0  Risk for fall due to : No Fall Risks    Most recent depression screenings:    10/23/2021   11:32 AM 10/17/2021    8:25 AM  PHQ 2/9 Scores  PHQ - 2 Score 0 0  PHQ- 9 Score 0 0   Most recent cognitive screening:     No data to display         Most recent Audit-C alcohol use screening    10/23/2021   11:32 AM  Alcohol Use Disorder Test (AUDIT)  1. How often do you have a drink containing alcohol? 0  2. How  many drinks containing alcohol do you have on a typical day when you are drinking? 0  3. How often do you have six or more drinks on one occasion? 0  AUDIT-C Score 0   A score of 3 or more in women, and 4 or more in men indicates increased risk for alcohol abuse, EXCEPT if all of the points are from question 1   No results found  for any visits on 10/23/21.  Assessment & Plan     Annual wellness visit done today including the all of the following: Reviewed patient's Family Medical History Reviewed and updated list of patient's medical providers Assessment of cognitive impairment was done Assessed patient's functional ability Established a written schedule for health screening [place] Completed and Reviewed  Exercise Activities and Dietary recommendations --balanced diet high in fiber and protein, low in sugars, carbs, fats.  Immunization History  Administered Date(s) Administered   Influenza,inj,Quad PF,6+ Mos 01/07/2013, 01/05/2018, 02/07/2019, 12/30/2019, 01/31/2021   PFIZER(Purple Top)SARS-COV-2 Vaccination 08/16/2019, 09/06/2019, 03/09/2020   Td 12/20/2004, 10/23/2021   Tdap 08/16/2010    Health Maintenance  Topic Date Due   Hepatitis C Screening  Never done   COVID-19 Vaccine (4 - Booster for Pfizer series) 05/04/2020   INFLUENZA VACCINE  10/22/2021   PAP SMEAR-Modifier  09/29/2023 (Originally 10/16/2003)   TETANUS/TDAP  10/24/2031   HPV VACCINES  Aged Out   HIV Screening  Discontinued     Discussed health benefits of physical activity, and encouraged her to engage in regular exercise appropriate for her age and condition.    Problem List Items Addressed This Visit       Nervous and Auditory   Partial epilepsy with impairment of consciousness ([place])    Recently saw neuro. Partial seizures during menstrual. No changes in meds Will check tegretol level again.       Relevant Orders   CBC w/Diff/Platelet   Carbamazepine Level (Tegretol),  total   Comprehensive Metabolic Panel (CMET)     Other   Active autistic disorder    Stable pt is well taken care of at home      Long-term use of high-risk medication    Will check tegretol level      Relevant Orders   Carbamazepine Level (Tegretol), total   Comprehensive Metabolic Panel (CMET)   HLD (hyperlipidemia)    Diet related, no formal exercise 2/2 neuro condition Will recheck, checked annually until if stable, and consider statins in the next 1-5 years if increasing, or just d/t cardiac family history       Relevant Orders   Lipid Profile   Comprehensive Metabolic Panel (CMET)   Other Visit Diagnoses     Annual physical exam    -  Primary   Need for Td vaccine       Relevant Orders   Td : Tetanus/diphtheria >7yo Preservative  free (Completed)        Return in about 1 year (around 10/24/2022) for CPE.     I, [name], PA-C have reviewed all documentation for this visit. The documentation on  10/23/2021 for the exam, diagnosis, procedures, and orders are all accurate and complete.  [name], PA-C [facility] [address] [place], [place], [address] Office: [phone] Fax: [place]

## 2021-10-23 ENCOUNTER — Encounter: Payer: Self-pay | Admitting: Physician Assistant

## 2021-10-23 ENCOUNTER — Ambulatory Visit (INDEPENDENT_AMBULATORY_CARE_PROVIDER_SITE_OTHER): Payer: Medicare Other | Admitting: Physician Assistant

## 2021-10-23 VITALS — BP 97/67 | HR 62 | Ht 68.0 in | Wt 158.5 lb

## 2021-10-23 DIAGNOSIS — Z79899 Other long term (current) drug therapy: Secondary | ICD-10-CM | POA: Diagnosis not present

## 2021-10-23 DIAGNOSIS — Z0000 Encounter for general adult medical examination without abnormal findings: Secondary | ICD-10-CM | POA: Diagnosis not present

## 2021-10-23 DIAGNOSIS — G40209 Localization-related (focal) (partial) symptomatic epilepsy and epileptic syndromes with complex partial seizures, not intractable, without status epilepticus: Secondary | ICD-10-CM | POA: Diagnosis not present

## 2021-10-23 DIAGNOSIS — F840 Autistic disorder: Secondary | ICD-10-CM

## 2021-10-23 DIAGNOSIS — Z23 Encounter for immunization: Secondary | ICD-10-CM | POA: Diagnosis not present

## 2021-10-23 DIAGNOSIS — E785 Hyperlipidemia, unspecified: Secondary | ICD-10-CM | POA: Diagnosis not present

## 2021-10-23 NOTE — Assessment & Plan Note (Signed)
Diet related, no formal exercise 2/2 neuro condition Will recheck, checked annually until if stable, and consider statins in the next 1-5 years if increasing, or just d/t cardiac family history

## 2021-10-23 NOTE — Assessment & Plan Note (Signed)
Recently saw neuro. Partial seizures during menstrual. No changes in meds Will check tegretol level again.

## 2021-10-23 NOTE — Assessment & Plan Note (Signed)
Stable pt is well taken care of at home

## 2021-10-23 NOTE — Assessment & Plan Note (Signed)
Will check tegretol level

## 2021-10-24 ENCOUNTER — Telehealth: Payer: Self-pay | Admitting: *Deleted

## 2021-10-24 ENCOUNTER — Other Ambulatory Visit: Payer: Self-pay | Admitting: Physician Assistant

## 2021-10-24 DIAGNOSIS — E876 Hypokalemia: Secondary | ICD-10-CM

## 2021-10-24 LAB — CBC WITH DIFFERENTIAL/PLATELET
Basophils Absolute: 0 10*3/uL (ref 0.0–0.2)
Basos: 1 %
EOS (ABSOLUTE): 0 10*3/uL (ref 0.0–0.4)
Eos: 1 %
Hematocrit: 38.5 % (ref 34.0–46.6)
Hemoglobin: 12.5 g/dL (ref 11.1–15.9)
Immature Grans (Abs): 0 10*3/uL (ref 0.0–0.1)
Immature Granulocytes: 0 %
Lymphocytes Absolute: 1.1 10*3/uL (ref 0.7–3.1)
Lymphs: 37 %
MCH: 31.5 pg (ref 26.6–33.0)
MCHC: 32.5 g/dL (ref 31.5–35.7)
MCV: 97 fL (ref 79–97)
Monocytes Absolute: 0.4 10*3/uL (ref 0.1–0.9)
Monocytes: 12 %
Neutrophils Absolute: 1.4 10*3/uL (ref 1.4–7.0)
Neutrophils: 49 %
Platelets: 184 10*3/uL (ref 150–450)
RBC: 3.97 x10E6/uL (ref 3.77–5.28)
RDW: 11.9 % (ref 11.7–15.4)
WBC: 2.9 10*3/uL — ABNORMAL LOW (ref 3.4–10.8)

## 2021-10-24 LAB — COMPREHENSIVE METABOLIC PANEL
ALT: 10 IU/L (ref 0–32)
AST: 16 IU/L (ref 0–40)
Albumin/Globulin Ratio: 1.5 (ref 1.2–2.2)
Albumin: 4.1 g/dL (ref 3.9–4.9)
Alkaline Phosphatase: 97 IU/L (ref 44–121)
BUN/Creatinine Ratio: 20 (ref 9–23)
BUN: 13 mg/dL (ref 6–20)
Bilirubin Total: <0.2 mg/dL (ref 0.0–1.2)
CO2: 20 mmol/L (ref 20–29)
Calcium: 9.3 mg/dL (ref 8.7–10.2)
Chloride: 111 mmol/L — ABNORMAL HIGH (ref 96–106)
Creatinine, Ser: 0.64 mg/dL (ref 0.57–1.00)
Globulin, Total: 2.7 g/dL (ref 1.5–4.5)
Glucose: 93 mg/dL (ref 70–99)
Potassium: 3.9 mmol/L (ref 3.5–5.2)
Sodium: 143 mmol/L (ref 134–144)
Total Protein: 6.8 g/dL (ref 6.0–8.5)
eGFR: 115 mL/min/{1.73_m2} (ref >59)

## 2021-10-24 LAB — LIPID PANEL
Chol/HDL Ratio: 2.8 ratio (ref 0.0–4.4)
Cholesterol, Total: 199 mg/dL (ref 100–199)
HDL: 71 mg/dL (ref >39)
LDL Chol Calc (NIH): 121 mg/dL — ABNORMAL HIGH (ref 0–99)
Triglycerides: 39 mg/dL (ref 0–149)
VLDL Cholesterol Cal: 7 mg/dL (ref 5–40)

## 2021-10-24 LAB — CARBAMAZEPINE LEVEL, TOTAL: Carbamazepine (Tegretol), S: 10.5 ug/mL (ref 4.0–12.0)

## 2021-10-24 NOTE — Telephone Encounter (Signed)
Result note read to pts mother. She is asking PCP to alert neurologist regarding WBC ct and tegretol level. States she will do what is recommended by [name], wait and see, or hematology consult. Please advise

## 2021-11-05 ENCOUNTER — Other Ambulatory Visit: Payer: Self-pay | Admitting: Family Medicine

## 2021-11-05 ENCOUNTER — Other Ambulatory Visit (INDEPENDENT_AMBULATORY_CARE_PROVIDER_SITE_OTHER): Payer: Self-pay | Admitting: Family

## 2021-11-05 ENCOUNTER — Other Ambulatory Visit: Payer: Self-pay | Admitting: Physician Assistant

## 2021-11-05 DIAGNOSIS — G40209 Localization-related (focal) (partial) symptomatic epilepsy and epileptic syndromes with complex partial seizures, not intractable, without status epilepticus: Secondary | ICD-10-CM

## 2021-11-05 DIAGNOSIS — D508 Other iron deficiency anemias: Secondary | ICD-10-CM

## 2021-11-05 NOTE — Telephone Encounter (Signed)
Requested Prescriptions  Pending Prescriptions Disp Refills  . fluvoxaMINE (LUVOX) 100 MG tablet [Pharmacy Med Name: FLUVOXAMINE MALEATE 100 MG TAB] 180 tablet 1    Sig: TAKE 1 TABLET BY MOUTH TWICE DAILY     Psychiatry:  Antidepressants - SSRI Passed - 11/05/2021 10:23 AM      Passed - Valid encounter within last 6 months    Recent Outpatient Visits          1 week ago Annual physical exam   [facility] [place], [place], PA-C   6 months ago Subacute frontal sinusitis   [facility] Caro Laroche, DO   7 months ago Pre-op evaluation   [facility] Caro Laroche, DO   9 months ago Elevated carbamazepine level   [facility] [name], PA-C   1 year ago Pure hypercholesterolemia   [facility], Marzella Schlein, MD

## 2021-11-06 ENCOUNTER — Telehealth: Payer: Self-pay | Admitting: Physician Assistant

## 2021-11-06 NOTE — Telephone Encounter (Signed)
Patient's mother stated that her daughter never received a phone call about lab results. And wants the provider to call.

## 2021-11-06 NOTE — Telephone Encounter (Signed)
Left pts mom a message

## 2021-11-08 ENCOUNTER — Other Ambulatory Visit: Payer: Self-pay | Admitting: Physician Assistant

## 2021-11-08 DIAGNOSIS — N946 Dysmenorrhea, unspecified: Secondary | ICD-10-CM

## 2021-11-08 DIAGNOSIS — R609 Edema, unspecified: Secondary | ICD-10-CM

## 2021-11-08 DIAGNOSIS — K219 Gastro-esophageal reflux disease without esophagitis: Secondary | ICD-10-CM

## 2021-11-08 NOTE — Telephone Encounter (Signed)
Requested Prescriptions  Pending Prescriptions Disp Refills  . naproxen (NAPROSYN) 500 MG tablet [Pharmacy Med Name: NAPROXEN 500 MG TAB] 180 tablet 1    Sig: TAKE 1 TABLET BY MOUTH TWICE DAILY     Analgesics:  NSAIDS Failed - 11/08/2021  3:53 PM      Failed - Manual Review: Labs are only required if the patient has taken medication for more than 8 weeks.      Passed - Cr in normal range and within 360 days    Creatinine, Ser  Date Value Ref Range Status  10/23/2021 0.64 0.57 - 1.00 mg/dL Final         Passed - HGB in normal range and within 360 days    Hemoglobin  Date Value Ref Range Status  10/23/2021 12.5 11.1 - 15.9 g/dL Final         Passed - PLT in normal range and within 360 days    Platelets  Date Value Ref Range Status  10/23/2021 184 150 - 450 x10E3/uL Final         Passed - HCT in normal range and within 360 days    Hematocrit  Date Value Ref Range Status  10/23/2021 38.5 34.0 - 46.6 % Final         Passed - eGFR is 30 or above and within 360 days    GFR calc Af Amer  Date Value Ref Range Status  05/16/2020 124 >59 mL/min/1.73 Final    Comment:    **In accordance with recommendations from the NKF-ASN Task force,**   [facility] is in the process of updating its eGFR calculation to the   2021 CKD-EPI creatinine equation that estimates kidney function   without a race variable.    GFR calc non Af Amer  Date Value Ref Range Status  05/16/2020 107 >59 mL/min/1.73 Final   eGFR  Date Value Ref Range Status  10/23/2021 115 >59 mL/min/1.73 Final         Passed - Patient is not pregnant      Passed - Valid encounter within last 12 months    Recent Outpatient Visits          2 weeks ago Annual physical exam   [facility] Thedore Mins, [place], PA-C   6 months ago Subacute frontal sinusitis   [facility] Myles Gip, DO   7 months ago Pre-op evaluation   [place], DO   9 months ago Elevated  carbamazepine level   [facility] [name], PA-C   1 year ago Pure hypercholesterolemia   [facility] [place], Dionne Bucy, MD             . omeprazole ([place]) 40 MG capsule [Pharmacy Med Name: OMEPRAZOLE DR 40 MG CAP] 90 capsule 1    Sig: TAKE 1 CAPSULE BY MOUTH ONCE DAILY     Gastroenterology: Proton Pump Inhibitors Passed - 11/08/2021  3:53 PM      Passed - Valid encounter within last 12 months    Recent Outpatient Visits          2 weeks ago Annual physical exam   [facility] [name], PA-C   6 months ago Subacute frontal sinusitis   [facility] Myles Gip, DO   7 months ago Pre-op evaluation   [facility] Myles Gip, DO   9 months ago Elevated carbamazepine level   [facility] [name], [place]  1 year ago Pure hypercholesterolemia   [facility] [place], Dionne Bucy, MD             . hydrochlorothiazide (HYDRODIURIL) 25 MG tablet [Pharmacy Med Name: HYDROCHLOROTHIAZIDE 25 MG TAB] 90 tablet 1    Sig: TAKE 1 TABLET BY MOUTH ONCE DAILY     Cardiovascular: Diuretics - Thiazide Passed - 11/08/2021  3:53 PM      Passed - Cr in normal range and within 180 days    Creatinine, Ser  Date Value Ref Range Status  10/23/2021 0.64 0.57 - 1.00 mg/dL Final         Passed - K in normal range and within 180 days    Potassium  Date Value Ref Range Status  10/23/2021 3.9 3.5 - 5.2 mmol/L Final         Passed - Na in normal range and within 180 days    Sodium  Date Value Ref Range Status  10/23/2021 143 134 - 144 mmol/L Final         Passed - Last BP in normal range    BP Readings from Last 1 Encounters:  10/23/21 97/67         Passed - Valid encounter within last 6 months    Recent Outpatient Visits          2 weeks ago Annual physical exam   [facility] [name], PA-C   6 months ago Subacute frontal  sinusitis   [facility] Myles Gip, DO   7 months ago Pre-op evaluation   [facility] Myles Gip, DO   9 months ago Elevated carbamazepine level   [facility] [name], PA-C   1 year ago Pure hypercholesterolemia   [facility], Dionne Bucy, MD

## 2021-12-03 ENCOUNTER — Other Ambulatory Visit: Payer: Self-pay | Admitting: Physician Assistant

## 2021-12-03 DIAGNOSIS — G479 Sleep disorder, unspecified: Secondary | ICD-10-CM

## 2022-01-03 ENCOUNTER — Other Ambulatory Visit: Payer: Self-pay | Admitting: Physician Assistant

## 2022-01-03 ENCOUNTER — Other Ambulatory Visit: Payer: Self-pay | Admitting: Family Medicine

## 2022-01-03 DIAGNOSIS — H1013 Acute atopic conjunctivitis, bilateral: Secondary | ICD-10-CM

## 2022-01-03 DIAGNOSIS — H10401 Unspecified chronic conjunctivitis, right eye: Secondary | ICD-10-CM

## 2022-01-03 NOTE — Telephone Encounter (Signed)
Requested Prescriptions  Pending Prescriptions Disp Refills  . LINZESS 145 MCG CAPS capsule [Pharmacy Med Name: LINZESS 145 MCG CAP] 90 capsule 0    Sig: TAKE 1 CAPSULE BY MOUTH ONCE DAILY     Gastroenterology: Irritable Bowel Syndrome Passed - 01/03/2022  8:59 AM      Passed - Valid encounter within last 12 months    Recent Outpatient Visits          2 months ago Annual physical exam   [facility] Thedore Mins, [place], PA-C   8 months ago Subacute frontal sinusitis   [facility] Myles Gip, DO   9 months ago Pre-op evaluation   [facility] Myles Gip, DO   11 months ago Elevated carbamazepine level   [facility] [name], PA-C   1 year ago Pure hypercholesterolemia   [facility] [place], Dionne Bucy, MD             . sulfacetamide (BLEPH-10) 10 % ophthalmic solution [Pharmacy Med Name: SULFACETAMIDE SODIUM 10% EYE SOLN M] 15 mL 5    Sig: PLACE 2 DROPS IN RIGHT EYE 4 TIMES DAILY     Off-Protocol Failed - 01/03/2022  8:59 AM      Failed - Medication not assigned to a protocol, review manually.      Passed - Valid encounter within last 12 months    Recent Outpatient Visits          2 months ago Annual physical exam   [facility] Thedore Mins, [place], PA-C   8 months ago Subacute frontal sinusitis   [facility] Myles Gip, DO   9 months ago Pre-op evaluation   [facility] Myles Gip, DO   11 months ago Elevated carbamazepine level   [facility] [name], PA-C   1 year ago Pure hypercholesterolemia   [facility], Dionne Bucy, MD

## 2022-01-03 NOTE — Telephone Encounter (Signed)
Requested medication (s) are due for refill today - yes  Requested medication (s) are on the active medication list -yes  Future visit scheduled -no  Last refill: 07/19/21 24ml 5 RF  Notes to clinic: Medication not assigned protocol- provider review   Requested Prescriptions  Pending Prescriptions Disp Refills   sulfacetamide (BLEPH-10) 10 % ophthalmic solution [Pharmacy Med Name: SULFACETAMIDE SODIUM 10% EYE SOLN M] 15 mL 5    Sig: PLACE 2 DROPS IN RIGHT EYE 4 TIMES DAILY     Off-Protocol Failed - 01/03/2022  8:59 AM      Failed - Medication not assigned to a protocol, review manually.      Passed - Valid encounter within last 12 months    Recent Outpatient Visits           2 months ago Annual physical exam   [facility] Ok [place], [place], PA-C   8 months ago Subacute frontal sinusitis   [facility] Caro [place], DO   9 months ago Pre-op evaluation   [facility] Caro Laroche, DO   11 months ago Elevated carbamazepine level   [facility] [name], PA-C   1 year ago Pure hypercholesterolemia   [facility], Marzella Schlein, MD              Signed Prescriptions Disp Refills   LINZESS 145 MCG CAPS capsule 90 capsule 0    Sig: TAKE 1 CAPSULE BY MOUTH ONCE DAILY     Gastroenterology: Irritable Bowel Syndrome Passed - 01/03/2022  8:59 AM      Passed - Valid encounter within last 12 months    Recent Outpatient Visits           2 months ago Annual physical exam   [facility] Ok Edwards, [place], PA-C   8 months ago Subacute frontal sinusitis   [facility] Caro Laroche, DO   9 months ago Pre-op evaluation   [facility] Caro Laroche, DO   11 months ago Elevated carbamazepine level   [facility] [name], PA-C   1 year ago Pure hypercholesterolemia   [facility] [place], Marzella Schlein, MD                  Requested Prescriptions  Pending Prescriptions Disp Refills   sulfacetamide (BLEPH-10) 10 % ophthalmic solution [Pharmacy Med Name: SULFACETAMIDE SODIUM 10% EYE SOLN M] 15 mL 5    Sig: PLACE 2 DROPS IN RIGHT EYE 4 TIMES DAILY     Off-Protocol Failed - 01/03/2022  8:59 AM      Failed - Medication not assigned to a protocol, review manually.      Passed - Valid encounter within last 12 months    Recent Outpatient Visits           2 months ago Annual physical exam   [facility] Edwards, [place], PA-C   8 months ago Subacute frontal sinusitis   [facility] Caro Laroche, DO   9 months ago Pre-op evaluation   [facility] Caro Laroche, DO   11 months ago Elevated carbamazepine level   [facility] [name], PA-C   1 year ago Pure hypercholesterolemia   [facility], Marzella Schlein, MD              Signed Prescriptions Disp Refills   LINZESS 145 MCG CAPS capsule 90 capsule 0  Sig: TAKE 1 CAPSULE BY MOUTH ONCE DAILY     Gastroenterology: Irritable Bowel Syndrome Passed - 01/03/2022  8:59 AM      Passed - Valid encounter within last 12 months    Recent Outpatient Visits           2 months ago Annual physical exam   [facility] Thedore Mins, [place], PA-C   8 months ago Subacute frontal sinusitis   [facility] Myles Gip, DO   9 months ago Pre-op evaluation   [facility] Myles Gip, DO   11 months ago Elevated carbamazepine level   [facility] [name], PA-C   1 year ago Pure hypercholesterolemia   [facility], Dionne Bucy, MD

## 2022-01-28 ENCOUNTER — Other Ambulatory Visit: Payer: Self-pay | Admitting: Physician Assistant

## 2022-01-28 ENCOUNTER — Other Ambulatory Visit: Payer: Self-pay | Admitting: Family Medicine

## 2022-01-28 DIAGNOSIS — H10401 Unspecified chronic conjunctivitis, right eye: Secondary | ICD-10-CM

## 2022-01-28 DIAGNOSIS — J302 Other seasonal allergic rhinitis: Secondary | ICD-10-CM

## 2022-01-28 NOTE — Telephone Encounter (Signed)
Requested medication (s) are due for refill today - yes  Requested medication (s) are on the active medication list -yes  Future visit scheduled -no  Last refill: 07/19/21 93ml 5RF  Notes to clinic: medication not assigned protocol- provider review   Requested Prescriptions  Pending Prescriptions Disp Refills   sulfacetamide (BLEPH-10) 10 % ophthalmic solution [Pharmacy Med Name: SULFACETAMIDE SODIUM 10% EYE SOLN M] 15 mL 5    Sig: PLACE 2 DROPS IN RIGHT EYE 4 TIMES DAILY     Off-Protocol Failed - 01/28/2022 10:28 AM      Failed - Medication not assigned to a protocol, review manually.      Passed - Valid encounter within last 12 months    Recent Outpatient Visits           3 months ago Annual physical exam   [facility] Thedore Mins, [place], PA-C   9 months ago Subacute frontal sinusitis   [facility] Myles Gip, DO   9 months ago Pre-op evaluation   [facility] Myles [facility]ip, DO   12 months ago Elevated carbamazepine level   [facility] [name], PA-C   1 year ago Pure hypercholesterolemia   [facility] [place], Dionne Bucy, MD                 Requested Prescriptions  Pending Prescriptions Disp Refills   sulfacetamide (BLEPH-10) 10 % ophthalmic solution [Pharmacy Med Name: SULFACETAMIDE SODIUM 10% EYE SOLN M] 15 mL 5    Sig: PLACE 2 DROPS IN RIGHT EYE 4 TIMES DAILY     Off-Protocol Failed - 01/28/2022 10:28 AM      Failed - Medication not assigned to a protocol, review manually.      Passed - Valid encounter within last 12 months    Recent Outpatient Visits           3 months ago Annual physical exam   [facility] Thedore Mins, [place], PA-C   9 months ago Subacute frontal sinusitis   [facility] Myles Gip, DO   9 months ago Pre-op evaluation   [place], DO   12 months ago Elevated carbamazepine level    [facility] [name], PA-C   1 year ago Pure hypercholesterolemia   [facility], Dionne Bucy, MD

## 2022-02-27 ENCOUNTER — Other Ambulatory Visit: Payer: Self-pay | Admitting: Physician Assistant

## 2022-02-27 DIAGNOSIS — M62838 Other muscle spasm: Secondary | ICD-10-CM

## 2022-02-28 NOTE — Telephone Encounter (Signed)
Requested interface surescripts. Last refill doc. 01/03/22 #90 . Requesting too soon . Requested Prescriptions  Signed Prescriptions Disp Refills   fluvoxaMINE (LUVOX) 100 MG tablet 180 tablet 0    Sig: TAKE 1 TABLET BY MOUTH TWICE DAILY     Psychiatry:  Antidepressants - SSRI Passed - 02/27/2022  4:56 PM      Passed - Valid encounter within last 6 months    Recent Outpatient Visits           4 months ago Annual physical exam   [facility] Thedore Mins, [place], PA-C   10 months ago Subacute frontal sinusitis   [facility] Myles Gip, DO   10 months ago Pre-op evaluation   [facility] Myles Gip, DO   1 year ago Elevated carbamazepine level   [facility] [name], PA-C   1 year ago Pure hypercholesterolemia   [facility] [place], Dionne Bucy, MD       Future Appointments             In 1 month Drubel, Ria Comment, PA-C [facility]             baclofen (LIORESAL) 10 MG tablet 60 each 1    Sig: TAKE 1 TABLET BY MOUTH TWICE DAILY     Analgesics:  Muscle Relaxants - baclofen Passed - 02/27/2022  4:56 PM      Passed - Cr in normal range and within 180 days    Creatinine, Ser  Date Value Ref Range Status  10/23/2021 0.64 0.57 - 1.00 mg/dL Final         Passed - eGFR is 30 or above and within 180 days    GFR calc Af Amer  Date Value Ref Range Status  05/16/2020 124 >59 mL/min/1.73 Final    Comment:    **In accordance with recommendations from the NKF-ASN Task force,**   [facility] is in the process of updating its eGFR calculation to the   2021 CKD-EPI creatinine equation that estimates kidney function   without a race variable.    GFR calc non Af Amer  Date Value Ref Range Status  05/16/2020 107 >59 mL/min/1.73 Final   eGFR  Date Value Ref Range Status  10/23/2021 115 >59 mL/min/1.73 Final         Passed - Valid encounter within last 6 months    Recent  Outpatient Visits           4 months ago Annual physical exam   [facility] [name], PA-C   10 months ago Subacute frontal sinusitis   [facility] Myles Gip, DO   10 months ago Pre-op evaluation   [facility] Myles Gip, DO   1 year ago Elevated carbamazepine level   [facility] [name], PA-C   1 year ago Pure hypercholesterolemia   [facility], Dionne Bucy, MD       Future Appointments             In 1 month Drubel, Ria Comment, [place], [facility]            Refused Prescriptions Disp Refills   LINZESS 145 MCG CAPS capsule [Pharmacy Med Name: LINZESS 145 MCG CAP] 90 capsule 0    Sig: TAKE 1 CAPSULE BY MOUTH ONCE DAILY     Gastroenterology: Irritable Bowel Syndrome Passed - 02/27/2022  4:56 PM      Passed -  Valid encounter within last 12 months    Recent Outpatient Visits           4 months ago Annual physical exam   [facility] Thedore Mins, [place], PA-C   10 months ago Subacute frontal sinusitis   [facility] Myles Gip, DO   10 months ago Pre-op evaluation   [facility] Myles Gip, DO   1 year ago Elevated carbamazepine level   [facility] [name], PA-C   1 year ago Pure hypercholesterolemia   [facility], Dionne Bucy, MD       Future Appointments             In 1 month Thedore Mins, Ria Comment, PA-C [employer], [facility]

## 2022-02-28 NOTE — Telephone Encounter (Signed)
Future visit in 1 month  Requested Prescriptions  Pending Prescriptions Disp Refills   fluvoxaMINE (LUVOX) 100 MG tablet [Pharmacy Med Name: FLUVOXAMINE MALEATE 100 MG TAB] 180 tablet 0    Sig: TAKE 1 TABLET BY MOUTH TWICE DAILY     Psychiatry:  Antidepressants - SSRI Passed - 02/27/2022  4:56 PM      Passed - Valid encounter within last 6 months    Recent Outpatient Visits           4 months ago Annual physical exam   [facility] Thedore Mins, [place], PA-C   10 months ago Subacute frontal sinusitis   [facility] Myles Gip, DO   10 months ago Pre-op evaluation   [facility] Myles Gip, DO   1 year ago Elevated carbamazepine level   [facility] [name], PA-C   1 year ago Pure hypercholesterolemia   [facility] [place], Dionne Bucy, MD       Future Appointments             In 1 month Drubel, Ria Comment, [place], [facility]             baclofen (LIORESAL) 10 MG tablet [employer] Med Name: BACLOFEN 10 MG TAB] 60 each 1    Sig: TAKE 1 TABLET BY MOUTH TWICE DAILY     Analgesics:  Muscle Relaxants - baclofen Passed - 02/27/2022  4:56 PM      Passed - Cr in normal range and within 180 days    Creatinine, Ser  Date Value Ref Range Status  10/23/2021 0.64 0.57 - 1.00 mg/dL Final         Passed - eGFR is 30 or above and within 180 days    GFR calc Af Amer  Date Value Ref Range Status  05/16/2020 124 >59 mL/min/1.73 Final    Comment:    **In accordance with recommendations from the NKF-ASN Task force,**   [facility] is in the process of updating its eGFR calculation to the   2021 CKD-EPI creatinine equation that estimates kidney function   without a race variable.    GFR calc non Af Amer  Date Value Ref Range Status  05/16/2020 107 >59 mL/min/1.73 Final   eGFR  Date Value Ref Range Status  10/23/2021 115 >59 mL/min/1.73 Final         Passed - Valid encounter  within last 6 months    Recent Outpatient Visits           4 months ago Annual physical exam   [facility] [name], PA-C   10 months ago Subacute frontal sinusitis   [facility] Myles Gip, DO   10 months ago Pre-op evaluation   [facility] Myles Gip, DO   1 year ago Elevated carbamazepine level   [facility] [name], PA-C   1 year ago Pure hypercholesterolemia   [facility], Dionne Bucy, MD       Future Appointments             In 1 month Drubel, Ria Comment, PA-C [facility]             LINZESS 145 MCG CAPS capsule [place] Med Name: LINZESS 145 MCG CAP] 90 capsule 0    Sig: TAKE 1 CAPSULE BY MOUTH ONCE DAILY     Gastroenterology: Irritable Bowel Syndrome Passed - 02/27/2022  4:56 PM  Passed - Valid encounter within last 12 months    Recent Outpatient Visits           4 months ago Annual physical exam   [facility] Thedore Mins, [place], PA-C   10 months ago Subacute frontal sinusitis   [facility] Myles Gip, DO   10 months ago Pre-op evaluation   [facility] Myles Gip, DO   1 year ago Elevated carbamazepine level   [facility] [name], PA-C   1 year ago Pure hypercholesterolemia   [facility], Dionne Bucy, MD       Future Appointments             In 1 month Thedore Mins, Ria Comment, PA-C [employer], [facility]

## 2022-04-01 ENCOUNTER — Ambulatory Visit: Payer: Medicare Other | Admitting: Physician Assistant

## 2022-04-02 ENCOUNTER — Ambulatory Visit (INDEPENDENT_AMBULATORY_CARE_PROVIDER_SITE_OTHER): Payer: Medicare Other | Admitting: Physician Assistant

## 2022-04-02 ENCOUNTER — Encounter: Payer: Self-pay | Admitting: Physician Assistant

## 2022-04-02 VITALS — BP 99/61 | HR 93 | Wt 163.7 lb

## 2022-04-02 DIAGNOSIS — Z01818 Encounter for other preprocedural examination: Secondary | ICD-10-CM

## 2022-04-02 NOTE — Progress Notes (Signed)
I,Sha'taria Tyson,acting as a Education administrator for [employer], PA-C.,have documented all relevant documentation on the behalf of [name], PA-C,as directed by  [name], PA-C while in the presence of [name], PA-C.   Established patient visit   Patient: Joanna Robinson   DOB: 1980-07-09   42 y.o. Female  MRN: [id number] Visit Date: 04/02/2022  Today's healthcare provider: [name], PA-C   Cc. Surgical clearance  Subjective    HPI  Margean presents today for surgical clearance for dental surgery, scheduled 04/17/22.  No acute concerns today.  Medications: Outpatient Medications Prior to Visit  Medication Sig   acetaZOLAMIDE (DIAMOX) 250 MG tablet TAKE 1 TABLET BY MOUTH TWICE DAILY   azelastine (OPTIVAR) 0.05 % ophthalmic solution PLACE 1 DROP IN BOTH EYES TWICE DAILY   baclofen (LIORESAL) 10 MG tablet TAKE 1 TABLET BY MOUTH TWICE DAILY   benzoyl peroxide-erythromycin (BENZAMYCIN) gel APPLY TO AFFECTED AREA(s) TWICE DAILY ASDIRECTED   brompheniramine-pseudoephedrine-DM 30-2-10 MG/5ML syrup Take 5 mLs by mouth 4 (four) times daily as needed.   carbamazepine (CARBATROL) 200 MG 12 hr capsule TAKE 1 CAPSULE BY MOUTH ONCE EVERY MORNING AND 1 CAPSULE ONCE EVERY EVENINGAS DIRECTED DOSE DECREASE*   cloNIDine (CATAPRES) 0.2 MG tablet TAKE 1 TABLET BY MOUTH AT BEDTIME   ferrous gluconate (FERGON) 324 MG tablet TAKE 1 TABLET BY MOUTH TWICE DAILY WITH MEALS   fexofenadine (ALLEGRA) 180 MG tablet TAKE 1 TABLET BY MOUTH ONCE DAILY   fluticasone (FLONASE) 50 MCG/ACT nasal spray Place into both nostrils.   fluvoxaMINE (LUVOX) 100 MG tablet TAKE 1 TABLET BY MOUTH TWICE DAILY   GNP MUCUS ER 600 MG 12 hr tablet TAKE 1 TABLET BY MOUTH TWICE DAILY FOR 15 DAYS   hydrochlorothiazide (HYDRODIURIL) 25 MG tablet TAKE 1 TABLET BY MOUTH ONCE DAILY   LINZESS 145 MCG CAPS capsule TAKE 1 CAPSULE BY MOUTH ONCE DAILY   MAPAP 500 MG tablet TAKE 2 TABLETS BY MOUTH 3 TIMES DAILY ASNEEDED  HEADACHE   montelukast (SINGULAIR) 10 MG tablet TAKE 1 TABLET BY MOUTH ONCE EVERY EVENING   naproxen (NAPROSYN) 500 MG tablet TAKE 1 TABLET BY MOUTH TWICE DAILY   omeprazole (PRILOSEC) 40 MG capsule TAKE 1 CAPSULE BY MOUTH ONCE DAILY   potassium chloride (KLOR-CON) 20 MEQ packet MIX AND DISSOLVE 1 PACKET IN 4OZ OF LIQUID AND DRINK ONCE DAILY   sodium fluoride (DENTA 5000 PLUS) 1.1 % CREA dental cream Place 1 application onto teeth every evening. Brush teeth daily for 1 minute, rinse and spit.   sulfacetamide (BLEPH-10) 10 % ophthalmic solution PLACE 2 DROPS IN RIGHT EYE 4 TIMES DAILY   triamcinolone cream (KENALOG) 0.1 % APPLY TO AFFECTED AREA(s) TWICE DAILY ASDIRECTED   No facility-administered medications prior to visit.    Review of Systems  Constitutional:  Negative for fatigue and fever.  Respiratory:  Negative for cough and shortness of breath.   Cardiovascular:  Negative for chest pain and leg swelling.  Gastrointestinal:  Negative for abdominal pain.  Neurological:  Negative for dizziness and headaches.      Objective    Blood pressure 99/61, pulse 93, weight 163 lb 11.2 oz (74.3 kg), SpO2 100 %.   Physical Exam Constitutional:      General: She is awake.     Appearance: She is well-developed.  HENT:     Head: Normocephalic.  Eyes:     Conjunctiva/sclera: Conjunctivae normal.     Pupils: Pupils are equal, round, and reactive to  light.  Cardiovascular:     Rate and Rhythm: Normal rate and regular rhythm.     Heart sounds: Normal heart sounds.  Pulmonary:     Effort: Pulmonary effort is normal.     Breath sounds: Normal breath sounds.  Musculoskeletal:     Right lower leg: No edema.     Left lower leg: No edema.  Skin:    General: Skin is warm.  Neurological:     Mental Status: She is alert and oriented to person, place, and time.  Psychiatric:        Attention and Perception: Attention normal.        Mood and Affect: Mood normal.        Speech: Speech normal.         Behavior: Behavior is cooperative.      No results found for any visits on 04/02/22.  Assessment & Plan     Surgical clearance Pt is cleared with low risk for dental surgery Reviewed labs from 8/23 all WNL or stable.  I, [name], PA-C have reviewed all documentation for this visit. The documentation on  04/02/22  for the exam, diagnosis, procedures, and orders are all accurate and complete.  [name], PA-C [facility] [address] [place], [place], [address] Office: [phone] Fax: [place]

## 2022-04-10 ENCOUNTER — Other Ambulatory Visit: Payer: Self-pay | Admitting: Family Medicine

## 2022-04-10 ENCOUNTER — Telehealth: Payer: Self-pay

## 2022-04-10 DIAGNOSIS — L700 Acne vulgaris: Secondary | ICD-10-CM

## 2022-04-10 NOTE — Telephone Encounter (Signed)
Copied from [place] [phone]. Topic: General - Other >> Apr 10, 2022  1:53 PM Ludger Nutting wrote: Patient's mother called to speak with Ria Comment about some issues they are having. Patient's mother said they had to cancel her dental surgery that was scheduled for next Thursday. Please follow up.

## 2022-04-10 NOTE — Telephone Encounter (Signed)
Requested Prescriptions  Pending Prescriptions Disp Refills   benzoyl peroxide-erythromycin (BENZAMYCIN) gel [Pharmacy Med Name: BENZOYL PEROXIDE-ERYTHROMYCIN 5-3%] 46.6 g 5    Sig: APPLY TO AFFECTED AREA(s) TWICE DAILY ASDIRECTED     Dermatology:  Acne preparations Passed - 04/10/2022  3:15 PM      Passed - Valid encounter within last 12 months    Recent Outpatient Visits           1 week ago Preop general physical exam   [facility] [name], PA-C   5 months ago Annual physical exam   [facility] Thedore Mins, [place], PA-C   11 months ago Subacute frontal sinusitis   [facility] [place], Jake Church, DO   1 year ago Pre-op evaluation   [facility] Myles Gip, DO   1 year ago Elevated carbamazepine level   [facility] [name], [place]

## 2022-04-10 NOTE — Telephone Encounter (Signed)
Spoke to pt-- postpone the surgery due to closing dental surgery and having big meeting tomorrow. Pt mother requesting referral to another oral surgeon. Please Joanna Robinson

## 2022-04-16 ENCOUNTER — Other Ambulatory Visit: Payer: Self-pay | Admitting: Physician Assistant

## 2022-04-16 DIAGNOSIS — F840 Autistic disorder: Secondary | ICD-10-CM

## 2022-04-16 DIAGNOSIS — Z98818 Other dental procedure status: Secondary | ICD-10-CM

## 2022-04-16 NOTE — Telephone Encounter (Signed)
Spoke to mom--not sure exactly what kind procedure but pt mother know is gum treatment and fillings, but needed to be done at the hospital so the pt can sleep during procedure.

## 2022-04-17 NOTE — Telephone Encounter (Signed)
LVM--regarding referral been placed for the pt.

## 2022-04-18 ENCOUNTER — Other Ambulatory Visit: Payer: Self-pay | Admitting: Physician Assistant

## 2022-04-18 MED ORDER — BENZOYL PEROXIDE 5 % EX GEL: Freq: Two times a day (BID) | CUTANEOUS | 1 refills | Status: AC

## 2022-04-18 MED ORDER — POTASSIUM CHLORIDE 20 MEQ PO PACK: 20.0000 meq | PACK | Freq: Every day | ORAL | 3 refills | Status: DC

## 2022-04-26 ENCOUNTER — Other Ambulatory Visit (INDEPENDENT_AMBULATORY_CARE_PROVIDER_SITE_OTHER): Payer: Self-pay | Admitting: Family

## 2022-04-26 DIAGNOSIS — G40209 Localization-related (focal) (partial) symptomatic epilepsy and epileptic syndromes with complex partial seizures, not intractable, without status epilepticus: Secondary | ICD-10-CM

## 2022-04-28 NOTE — Telephone Encounter (Signed)
Seen 10/03/2021 RTC in 1 yr. Not scheduled yet Rx written 11/05/21 with 5 refills

## 2022-05-09 ENCOUNTER — Telehealth (INDEPENDENT_AMBULATORY_CARE_PROVIDER_SITE_OTHER): Payer: Self-pay | Admitting: Family

## 2022-05-09 ENCOUNTER — Telehealth: Payer: Self-pay | Admitting: Physician Assistant

## 2022-05-09 NOTE — Telephone Encounter (Signed)
Mom called back.  Mom stated that she Joanna Robinson is set to receive services that she has been waiting 12 years for.   A representative from the facility just needs to speak to Mrs. Joanna Robinson one more time. As asked that Mrs. Joanna Robinson call :  Gustavus Messing  [phone] Ext [phone]  Mom also asked that once this call is completed, Mrs. Joanna Robinson give her call letting her know.  SS, CCMA

## 2022-05-09 NOTE — Telephone Encounter (Signed)
  Name of who is calling: [name]  Caller's Relationship to Patient: mom   Best contact number: [phone]  Provider they see: Otila Kluver  Reason for call: mom called and left a voice mail. She is states she needs to speak with Otila Kluver. She said its urgent but not a medical emergency.

## 2022-05-09 NOTE — Telephone Encounter (Signed)
Pts mother Alan Mulder is calling to request if the nurse can call Gustavus Messing, with [facility]. [phone]. X [phone] needing to discuss that pt still has epilepsy and has she continues to age that she is slow in movement.  CB- [phone] Alan Mulder

## 2022-05-09 NOTE — Telephone Encounter (Signed)
Attempted to contact patients mother to gather more information.  Mother was unable to be reached. LVM to call back.   SS, CCMA

## 2022-05-09 NOTE — Telephone Encounter (Signed)
I called and left a message for Ms. Joanna Robinson, and asked her to return my call. I left a message for Mom as well. TG

## 2022-05-12 NOTE — Telephone Encounter (Signed)
Joanna Robinson returned my call today. She said that she did not need further information at this time. TG

## 2022-05-14 NOTE — Telephone Encounter (Signed)
Called Joanna Robinson [facility] [id number] [phone]-stated sent the form. Notified Anderson Malta and pt mother-received the form and waiting to be complete by Ria Comment, Pt mother voiced understanding.

## 2022-05-15 NOTE — Telephone Encounter (Signed)
Credential added and from re-faxed to [phone]

## 2022-05-15 NOTE — Telephone Encounter (Signed)
Joanna Robinson with [facility] called back stating she received the form but only had Selinda Eon credentials on it and she still needs Dr Alba Cory credentials as well. Can this form be refaxed after Dr Maureen Chatters credntials are added. Please fax to [phone]

## 2022-05-28 ENCOUNTER — Telehealth: Payer: Self-pay | Admitting: Physician Assistant

## 2022-05-28 NOTE — Telephone Encounter (Signed)
[facility] called to report that their e-scribing system is down and that they need the office to fax over a request to refill the pt's LINZESS 145 MCG CAPS capsule

## 2022-05-29 ENCOUNTER — Other Ambulatory Visit: Payer: Self-pay | Admitting: Physician Assistant

## 2022-05-29 MED ORDER — LINACLOTIDE 145 MCG PO CAPS: 145.0000 ug | ORAL_CAPSULE | Freq: Every day | ORAL | 3 refills | Status: DC

## 2022-05-29 NOTE — Telephone Encounter (Signed)
Order placed in sorter to be faxed

## 2022-06-10 ENCOUNTER — Other Ambulatory Visit: Payer: Self-pay | Admitting: Physician Assistant

## 2022-06-10 ENCOUNTER — Ambulatory Visit: Payer: Self-pay

## 2022-06-10 DIAGNOSIS — R0981 Nasal congestion: Secondary | ICD-10-CM

## 2022-06-10 MED ORDER — AZELASTINE HCL 0.1 % NA SOLN: 1.0000 | Freq: Two times a day (BID) | NASAL | 1 refills | Status: DC

## 2022-06-10 NOTE — Telephone Encounter (Signed)
Patients mother advised. Verbalized understanding

## 2022-06-10 NOTE — Telephone Encounter (Addendum)
3rd attempt, Patient called, left VM to return the call to the office to discuss symptoms with a nurse. Unable to reach patient after 3 attempts by [facility] NT, routing to the provider for resolution per protocol.  Summary: congestion runny nose    Runny nose, itchy eyes, sinus congestion 3 days

## 2022-06-10 NOTE — Telephone Encounter (Signed)
2nd attempt, Patient called, left VM to return the call to the office to discuss symptoms with a nurse.  

## 2022-06-10 NOTE — Telephone Encounter (Signed)
Patient called, left VM to return the call to the office to discuss symptoms with a nurse.  Summary: congestion runny nose   Runny nose, itchy eyes, sinus congestion 3 days

## 2022-06-10 NOTE — Telephone Encounter (Signed)
Patients mother would like to see if their is a different nasal spray that could be sent into the pharmacy as the one she currently has a rx for is not helping and would also like to see if we could send a rx for the correct form of mucinex she should be using. Please advise

## 2022-06-19 ENCOUNTER — Telehealth (INDEPENDENT_AMBULATORY_CARE_PROVIDER_SITE_OTHER): Payer: Self-pay | Admitting: Family

## 2022-06-19 NOTE — Telephone Encounter (Signed)
  Name of who is calling: Joanna Robinson  Caller's Relationship to Patient: Mother  Best contact number: [phone]  Provider they see: Cloretta Ned  Reason for call: Received waiver for Joanna Robinson, a medical letter was given Jan 17th 2020 and  [name] is requesting that the same letter is sent but to change the date to whenever she talks to [place] her case worker. Her phone number is [phone].     PRESCRIPTION REFILL ONLY  Name of prescription:  Pharmacy:

## 2022-06-19 NOTE — Telephone Encounter (Signed)
Attempted to contact patients mother to gather more information about previous phone note.   Mom was unable to be reached.  LVM to call back.   SS, CCMA

## 2022-06-26 ENCOUNTER — Other Ambulatory Visit: Payer: Self-pay | Admitting: Physician Assistant

## 2022-06-26 DIAGNOSIS — M62838 Other muscle spasm: Secondary | ICD-10-CM

## 2022-06-27 ENCOUNTER — Telehealth: Payer: Self-pay | Admitting: Physician Assistant

## 2022-06-27 NOTE — Telephone Encounter (Signed)
[facility] requesting prior authorization  Key: [id number] Name: [place] Benzoyl Peroxide- Erythromycin 5-3% Gel

## 2022-07-02 NOTE — Telephone Encounter (Signed)
Benzoyl Peroxide-Erythromycin 5-3% gel Status: PA RequestCreated: April 4th, 2024 [id number] Sent: April 10th, 2024

## 2022-07-02 NOTE — Telephone Encounter (Signed)
Status: PA Response - ApprovedCreated: April 4th, 2024 [id number] Sent: April 10th, 2024

## 2022-07-19 ENCOUNTER — Other Ambulatory Visit: Payer: Self-pay | Admitting: Physician Assistant

## 2022-07-19 DIAGNOSIS — N946 Dysmenorrhea, unspecified: Secondary | ICD-10-CM

## 2022-07-19 DIAGNOSIS — M62838 Other muscle spasm: Secondary | ICD-10-CM

## 2022-07-19 DIAGNOSIS — H10401 Unspecified chronic conjunctivitis, right eye: Secondary | ICD-10-CM

## 2022-07-21 ENCOUNTER — Other Ambulatory Visit: Payer: Self-pay | Admitting: Physician Assistant

## 2022-07-21 ENCOUNTER — Other Ambulatory Visit (INDEPENDENT_AMBULATORY_CARE_PROVIDER_SITE_OTHER): Payer: Self-pay | Admitting: Family

## 2022-07-21 DIAGNOSIS — R609 Edema, unspecified: Secondary | ICD-10-CM

## 2022-07-21 DIAGNOSIS — G40209 Localization-related (focal) (partial) symptomatic epilepsy and epileptic syndromes with complex partial seizures, not intractable, without status epilepticus: Secondary | ICD-10-CM

## 2022-07-21 NOTE — Telephone Encounter (Signed)
Last OV 10/03/21 Next OV 08/06/2022 Rx sent 06/26/21  90 d with 3 refills 6 month supply-

## 2022-07-28 ENCOUNTER — Other Ambulatory Visit: Payer: Self-pay | Admitting: Physician Assistant

## 2022-07-28 DIAGNOSIS — R0981 Nasal congestion: Secondary | ICD-10-CM

## 2022-08-05 ENCOUNTER — Telehealth (INDEPENDENT_AMBULATORY_CARE_PROVIDER_SITE_OTHER): Payer: Self-pay | Admitting: Family

## 2022-08-05 NOTE — Telephone Encounter (Signed)
Please let Mom know that she does not need to get blood drawn before her appointment with me in June. She can get the level done the next time her PCP does a blood test. Thanks, Inetta Fermo

## 2022-08-05 NOTE — Telephone Encounter (Signed)
Contacted patients mother.  Informed her of the previous message. Mom verbalized understanding of this.  SS, CCMA

## 2022-08-05 NOTE — Telephone Encounter (Signed)
Who's calling (name and relationship to patient) :Joanna Robinson  Best contact number: [phone]  Provider they see: Elveria Rising, Np  Reason for call: Joanna Robinson has called in wanting to know if she needs to get Joanna Robinson's tapertall levels checked before the next appt. She has requested a call back.   Call ID:      PRESCRIPTION REFILL ONLY  Name of prescription:  Pharmacy:

## 2022-08-06 ENCOUNTER — Ambulatory Visit (INDEPENDENT_AMBULATORY_CARE_PROVIDER_SITE_OTHER): Payer: Medicare Other | Admitting: Family

## 2022-08-11 ENCOUNTER — Ambulatory Visit (INDEPENDENT_AMBULATORY_CARE_PROVIDER_SITE_OTHER): Payer: Medicare Other | Admitting: Physician Assistant

## 2022-08-11 ENCOUNTER — Encounter: Payer: Self-pay | Admitting: Physician Assistant

## 2022-08-11 VITALS — BP 92/62 | HR 70 | Temp 98.6°F | Resp 13 | Ht 68.0 in | Wt 163.0 lb

## 2022-08-11 DIAGNOSIS — H1131 Conjunctival hemorrhage, right eye: Secondary | ICD-10-CM

## 2022-08-11 NOTE — Progress Notes (Signed)
I,Vanessa  Vital,acting as a Neurosurgeon for [employer], PA-C.,have documented all relevant documentation on the behalf of [name], PA-C,as directed by  [name], PA-C while in the presence of [name], PA-C.    Established patient visit   Patient: Joanna Robinson   DOB: [birth year]   42 y.o. Female  MRN: [id number] Visit Date: 08/11/2022  Today's healthcare provider: [name], PA-C   Cc. Red right eye   Subjective    HPI  Patient is here today due to redness in her eyes that started today. Patient's mother states that patient reported not feeling well last night had a cough and sore throat and diarrhea this morning. Denies vomiting.   Pt is autistic and verbal but has a difficult time communicating how she feels. Medications: Outpatient Medications Prior to Visit  Medication Sig   acetaZOLAMIDE (DIAMOX) 250 MG tablet TAKE 1 TABLET BY MOUTH TWICE DAILY   azelastine (ASTELIN) 0.1 % nasal spray PLACE 1 SPRAY INTO BOTH NOSTRILS TWICE DAILY.   azelastine (OPTIVAR) 0.05 % ophthalmic solution PLACE 1 DROP IN BOTH EYES TWICE DAILY   baclofen (LIORESAL) 10 MG tablet TAKE 1 TABLET BY MOUTH TWICE DAILY   benzoyl peroxide 5 % gel Apply topically 2 (two) times daily. To affected area   brompheniramine-pseudoephedrine-DM 30-2-10 MG/5ML syrup Take 5 mLs by mouth 4 (four) times daily as needed.   carbamazepine (CARBATROL) 200 MG 12 hr capsule TAKE 1 CAPSULE BY MOUTH ONCE EVERY MORNING AND 1 CAPSULE ONCE EVERY EVENING   cloNIDine (CATAPRES) 0.2 MG tablet TAKE 1 TABLET BY MOUTH AT BEDTIME   ferrous gluconate (FERGON) 324 MG tablet TAKE 1 TABLET BY MOUTH TWICE DAILY WITH MEALS   fexofenadine (ALLEGRA) 180 MG tablet TAKE 1 TABLET BY MOUTH ONCE DAILY   fluticasone (FLONASE) 50 MCG/ACT nasal spray Place into both nostrils.   fluvoxaMINE (LUVOX) 100 MG tablet TAKE 1 TABLET BY MOUTH TWICE DAILY   GNP MUCUS ER 600 MG 12 hr tablet TAKE 1 TABLET BY MOUTH TWICE DAILY FOR 15 DAYS    hydrochlorothiazide (HYDRODIURIL) 25 MG tablet TAKE 1 TABLET BY MOUTH ONCE DAILY   linaclotide (LINZESS) 145 MCG CAPS capsule Take 1 capsule (145 mcg total) by mouth daily.   MAPAP 500 MG tablet TAKE 2 TABLETS BY MOUTH 3 TIMES DAILY ASNEEDED HEADACHE   montelukast (SINGULAIR) 10 MG tablet TAKE 1 TABLET BY MOUTH ONCE EVERY EVENING   naproxen (NAPROSYN) 500 MG tablet TAKE 1 TABLET BY MOUTH TWICE DAILY   omeprazole (PRILOSEC) 40 MG capsule TAKE 1 CAPSULE BY MOUTH ONCE DAILY   potassium chloride (KLOR-CON) 20 MEQ packet Take 20 mEq by mouth daily. Mix and dissolve 1 packet in 4 oz of liquid and drink once daily   sodium fluoride (DENTA 5000 PLUS) 1.1 % CREA dental cream Place 1 application onto teeth every evening. Brush teeth daily for 1 minute, rinse and spit.   sulfacetamide (BLEPH-10) 10 % ophthalmic solution PLACE 2 DROPS IN RIGHT EYE 4 TIMES DAILY   triamcinolone cream (KENALOG) 0.1 % APPLY TO AFFECTED AREA(s) TWICE DAILY ASDIRECTED   No facility-administered medications prior to visit.    Review of Systems  HENT:  Positive for sore throat.   All other systems reviewed and are negative.      Objective    BP 92/62 (BP Location: Right Arm, Patient Position: Sitting, Cuff Size: Normal)   Pulse 70   Temp 98.6 F (37 C) (Oral)   Resp 13  Ht 5\' 8"  (1.727 m)   Wt 163 lb (73.9 kg)   SpO2 100%   BMI 24.78 kg/m    Physical Exam Vitals reviewed.  Constitutional:      Appearance: She is not ill-appearing.  HENT:     Head: Normocephalic.     Nose: No congestion or rhinorrhea.     Mouth/Throat:     Pharynx: No posterior oropharyngeal erythema.  Eyes:     Conjunctiva/sclera: Conjunctivae normal.     Comments: Right eye with a injected portion of her lateral conjunctiva   Cardiovascular:     Rate and Rhythm: Normal rate.  Pulmonary:     Effort: Pulmonary effort is normal. No respiratory distress.  Neurological:     General: No focal deficit present.     Mental Status: She is  alert and oriented to person, place, and time.  Psychiatric:        Mood and Affect: Mood normal.        Behavior: Behavior normal.     No results found for any visits on 08/11/22.  Assessment & Plan     1. Subconjunctival hemorrhage of right eye Advised just watching. No light sensitivity or other signs of irritation.  If any worsening to call office  Return if symptoms worsen or fail to improve.     I, [name], PA-C have reviewed all documentation for this visit. The documentation on  08/11/22   for the exam, diagnosis, procedures, and orders are all accurate and complete.  [name], PA-C [facility] [address] [place], [place], [address] Office: [phone] Fax: [phone]   [facility]

## 2022-08-21 ENCOUNTER — Other Ambulatory Visit: Payer: Self-pay | Admitting: Physician Assistant

## 2022-08-21 DIAGNOSIS — G479 Sleep disorder, unspecified: Secondary | ICD-10-CM

## 2022-08-26 ENCOUNTER — Ambulatory Visit (INDEPENDENT_AMBULATORY_CARE_PROVIDER_SITE_OTHER): Payer: Medicare Other | Admitting: Family

## 2022-09-18 ENCOUNTER — Encounter (INDEPENDENT_AMBULATORY_CARE_PROVIDER_SITE_OTHER): Payer: Self-pay | Admitting: Family

## 2022-09-18 ENCOUNTER — Telehealth (INDEPENDENT_AMBULATORY_CARE_PROVIDER_SITE_OTHER): Payer: Medicare Other | Admitting: Family

## 2022-09-18 VITALS — Wt 164.0 lb

## 2022-09-18 DIAGNOSIS — F79 Unspecified intellectual disabilities: Secondary | ICD-10-CM

## 2022-09-18 DIAGNOSIS — F840 Autistic disorder: Secondary | ICD-10-CM

## 2022-09-18 DIAGNOSIS — G40209 Localization-related (focal) (partial) symptomatic epilepsy and epileptic syndromes with complex partial seizures, not intractable, without status epilepticus: Secondary | ICD-10-CM | POA: Diagnosis not present

## 2022-09-18 NOTE — Progress Notes (Signed)
This is a Pediatric Specialist E-Visit consult/follow up provided via My Chart Video Visit (Caregility). Joanna Robinson and her mother Joanna Robinson consented to an E-Visit consult today.  Is the patient present for the video visit? Yes Location of patient: Joanna Robinson is at home. Is the patient located in the state of [place]? Yes Location of provider: Elveria Rising NP-C is at office Patient was referred by [name], PA-C   The following participants were involved in this E-Visit: CMA, NP, patient and her mother  This visit was done via VIDEO   Chief Complain/ Reason for E-Visit today: seizure follow up Total time on call: 15 min Follow up: 1 year      Joanna Robinson   MRN:  [id number]  Jan 19, 1981   Provider: Elveria Rising NP-C Location of Care: [facility]  Visit type: return visit  Last visit: 10/03/2021  Referral source: [name], PA-C History from: Epic chart and patient's mother  Brief history:  Copied from previous record: History of undifferentiated autism and complex partial seizures, as well as tic-like abnormal movements. She remains very self directed and moves at a very slow pace. She is unable to be left alone and requires constant supervision and care.  Today's concerns: Intermittent brief seizures during menstrual cycle. Typically stares, "freezes" her movements. Lasts less than 1 minute. Was approved for [employer]. Is being cared for at home. Has PCP appointment later this summer and will have labs drawn at that time. [place] has been otherwise generally healthy since she was last seen. No health concerns today other than previously mentioned.  Review of systems: Please see HPI for neurologic and other pertinent review of systems. Otherwise all other systems were reviewed and were negative.  Problem List: Patient Active Problem List   Diagnosis Date Noted   Dark stools  07/13/2019   Abnormal blood chemistry 12/20/2014   Abscess of axilla 12/20/2014   Dry eye syndrome 12/20/2014   Dermoid inclusion cyst 12/20/2014   Hematoma 12/20/2014   HLD (hyperlipidemia) 12/20/2014   Decreased potassium in the blood 12/20/2014   Irregular bleeding 12/20/2014   Leukopenia 12/20/2014   Malaise and fatigue 12/20/2014   Headache 12/18/2014   Acne 10/30/2014   Constipation 09/18/2014   Neutropenia (HCC) 09/18/2014   Elevated carbamazepine level 01/25/2014   Partial epilepsy with impairment of consciousness (HCC) 01/10/2013   Active autistic disorder 01/10/2013   Long-term use of high-risk medication 01/10/2013   Dysmenorrhea 07/10/2008   Mechanical and motor problems with internal organs 11/08/2007   Allergic rhinitis 08/20/2007   External hemorrhoids without complication 11/13/2006   Edema 12/26/2003   Epilepsy, not refractory (HCC) 12/26/2003   Intellectual disability 12/26/2003     Past Medical History:  Diagnosis Date   Autism    Epilepsy (HCC)    Hemorrhoid    Hyperlipidemia    Seizures (HCC)     Past medical history comments: See HPI  Surgical history: Past Surgical History:  Procedure Laterality Date   NO PAST SURGERIES      Family history: family history includes Alzheimer's disease in her maternal grandmother and paternal grandfather.   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    She has a nurse that comes to her home and they go  out on field trips. (2 days a week)    She enjoys exercise, dance, and riding bike. She lives with her mother.   Social Determinants of Health   Financial Resource Strain: Low Risk  (10/17/2021)   Overall Financial Resource Strain (CARDIA)    Difficulty of Paying Living Expenses: Not hard at all  Food Insecurity: No Food Insecurity (10/17/2021)   Hunger Vital Sign    Worried About Running Out of Food in the Last Year: Never true    Ran Out of Food in the Last Year: Never true  Transportation Needs: No Transportation Needs (10/17/2021)   PRAPARE - Administrator, Civil Service (Medical): No    Lack of Transportation (Non-Medical): No  Physical Activity: Insufficiently Active (10/17/2021)   Exercise Vital Sign    Days of Exercise per Week: 2 days    Minutes of Exercise per Session: 30 min  Stress: No Stress Concern Present (10/17/2021)   [employer] of Occupational Health - Occupational Stress Questionnaire    Feeling of Stress : Not at all  Social Connections: Moderately Isolated (10/17/2021)   Social Connection and Isolation Panel [NHANES]    Frequency of Communication with Friends and Family: More than three times a week    Frequency of Social Gatherings with Friends and Family: More than three times a week    Attends Religious Services: More than 4 times per year    Active Member of [employer] or Organizations: No    Attends Banker Meetings: Never    Marital Status: Never married  Intimate Partner Violence: Not At Risk (10/17/2021)   Humiliation, Afraid, Rape, and Kick questionnaire    Fear of Current or Ex-Partner: No    Emotionally Abused: No    Physically Abused: No    Sexually Abused: No    Past/failed meds: Copied from previous record: Carbamazepine IR - side effects  Allergies: Allergies  Allergen Reactions   Codeine    Corticosteroids Nausea Only    Immunizations: Immunization History  Administered Date(s) Administered    Influenza,inj,Quad PF,6+ Mos 01/07/2013, 01/05/2018, 02/07/2019, 12/30/2019, 01/31/2021   PFIZER(Purple Top)SARS-COV-2 Vaccination 08/16/2019, 09/06/2019, 03/09/2020   Td 12/20/2004, 10/23/2021   Tdap 08/16/2010    Diagnostics/Screenings:  Physical Exam: Wt 164 lb (74.4 kg)   LMP 09/02/2022   BMI 24.94 kg/m   General: Well developed, well nourished woman, seated at home with her mother, in no evident distress Head: Head normocephalic and atraumatic.  Neck: Supple Musculoskeletal: No obvious deformities or scoliosis Skin: No rashes or neurocutaneous lesions  Neurologic Exam Mental Status: Awake and fully alert. Repeats words that her mother prompts her to say. Unable to follow instructions or participate in conversation or examination. Cranial Nerves: Extraocular movements full without nystagmus. Hearing intact and symmetric to voice on video  Face tongue, palate move normally and symmetrically.  Motor: Normal functional bulk, tone and strength Sensory: Intact to touch and temperature in all extremities.  Coordination: No dysmetria when reaching for objects Gait and Station: Arises from chair without difficulty.  Stance is normal. Gait demonstrates normal stride length and balance.    Impression: Partial epilepsy with impairment of consciousness (HCC)  Active autistic disorder  Intellectual disability   Recommendations for plan of care: The patient's previous Epic records were reviewed. No recent diagnostic studies to be reviewed with the patient.  Plan until next visit: Continue medications as prescribed  Have labs drawn to check Carbamazepine level at upcoming PCP visit this summer. Call if seizures become more frequent or more severe Return in about 1 year (around 09/18/2023).  The medication list was reviewed and reconciled. No changes were made in the prescribed medications today. A complete medication list was provided to the patient.  Allergies as of 09/18/2022        Reactions   Codeine    Corticosteroids Nausea Only        Medication List        Accurate as of September 18, 2022  9:00 AM. If you have any questions, ask your nurse or doctor.          acetaZOLAMIDE 250 MG tablet Commonly known as: DIAMOX TAKE 1 TABLET BY MOUTH TWICE DAILY   azelastine 0.05 % ophthalmic solution Commonly known as: OPTIVAR PLACE 1 DROP IN BOTH EYES TWICE DAILY   azelastine 0.1 % nasal spray Commonly known as: ASTELIN PLACE 1 SPRAY INTO BOTH NOSTRILS TWICE DAILY.   baclofen 10 MG tablet Commonly known as: LIORESAL TAKE 1 TABLET BY MOUTH TWICE DAILY   benzoyl peroxide 5 % gel Apply topically 2 (two) times daily. To affected area   brompheniramine-pseudoephedrine-DM 30-2-10 MG/5ML syrup Take 5 mLs by mouth 4 (four) times daily as needed.   carbamazepine 200 MG 12 hr capsule Commonly known as: Carbatrol TAKE 1 CAPSULE BY MOUTH ONCE EVERY MORNING AND 1 CAPSULE ONCE EVERY EVENING   cloNIDine 0.2 MG tablet Commonly known as: CATAPRES TAKE 1 TABLET BY MOUTH AT BEDTIME   ferrous gluconate 324 MG tablet Commonly known as: FERGON TAKE 1 TABLET BY MOUTH TWICE DAILY WITH MEALS   fexofenadine 180 MG tablet Commonly known as: ALLEGRA TAKE 1 TABLET BY MOUTH ONCE DAILY   fluticasone 50 MCG/ACT nasal spray Commonly known as: FLONASE Place into both nostrils.   fluvoxaMINE 100 MG tablet Commonly known as: LUVOX TAKE 1 TABLET BY MOUTH TWICE DAILY   GNP Mucus ER 600 MG 12 hr tablet Generic drug: guaiFENesin TAKE 1 TABLET BY MOUTH TWICE DAILY FOR 15 DAYS   hydrochlorothiazide 25 MG tablet Commonly known as: HYDRODIURIL TAKE 1 TABLET BY MOUTH ONCE DAILY   linaclotide 145 MCG Caps capsule Commonly known as: Linzess Take 1 capsule (145 mcg total) by mouth daily.   Mapap 500 MG tablet Generic drug: acetaminophen TAKE 2 TABLETS BY MOUTH 3 TIMES DAILY ASNEEDED HEADACHE   montelukast 10 MG tablet Commonly known as: SINGULAIR TAKE 1 TABLET BY MOUTH ONCE  EVERY EVENING   naproxen 500 MG tablet Commonly known as: NAPROSYN TAKE 1 TABLET BY MOUTH TWICE DAILY   omeprazole 40 MG capsule Commonly known as: PRILOSEC TAKE 1 CAPSULE BY MOUTH ONCE DAILY   potassium chloride 20 MEQ packet Commonly known as: KLOR-CON Take 20 mEq by mouth daily. Mix and dissolve 1 packet in 4 oz of liquid and drink once daily   sodium fluoride 1.1 % Crea dental cream Commonly known as: Denta 5000 Plus Place 1 application onto teeth every evening. Brush teeth daily for 1 minute, rinse and spit.   sulfacetamide 10 % ophthalmic solution Commonly  known as: BLEPH-10 PLACE 2 DROPS IN RIGHT EYE 4 TIMES DAILY   triamcinolone cream 0.1 % Commonly known as: KENALOG APPLY TO AFFECTED AREA(s) TWICE DAILY ASDIRECTED      Total time spent with the patient was 15 minutes, of which 50% or more was spent in counseling and coordination of care.  Elveria Rising NP-C [facility] [address], [address] [place], [place] [address] Ph. [phone] Fax [phone]

## 2022-09-18 NOTE — Patient Instructions (Signed)
It was a pleasure to see you today!  Instructions for you until your next appointment are as follows: Continue Taimi's medications as prescribed Let me know if her seizures become more frequent or more severe I will call you when I receive lab results from her PCP later this summer.  Please sign up for MyChart if you have not done so. Please plan to return for follow up in  or sooner if needed.  Feel free to contact our office during normal business hours at [phone] with questions or concerns. If there is no answer or the call is outside business hours, please leave a message and our clinic staff will call you back within the next business day.  If you have an urgent concern, please stay on the line for our after-hours answering service and ask for the on-call neurologist.     I also encourage you to use MyChart to communicate with me more directly. If you have not yet signed up for MyChart within [facility], the front desk staff can help you. However, please note that this inbox is NOT monitored on nights or weekends, and response can take up to 2 business days.  Urgent matters should be discussed with the on-call pediatric neurologist.   At [facility], we are committed to providing exceptional care. You will receive a patient satisfaction survey through text or email regarding your visit today. Your opinion is important to me. Comments are appreciated.

## 2022-09-22 IMAGING — CR DG CERVICAL SPINE COMPLETE 4+V
1 series · 6 of 6 positions shown · non-contrast
Comparison: None.

CLINICAL DATA: Neck pain.

EXAM:
CERVICAL SPINE - COMPLETE 4+ VIEW

[Series 1: dg cervical spine complete · 0.14mm/px · 6 of 6 slices shown]
[im 1/6]
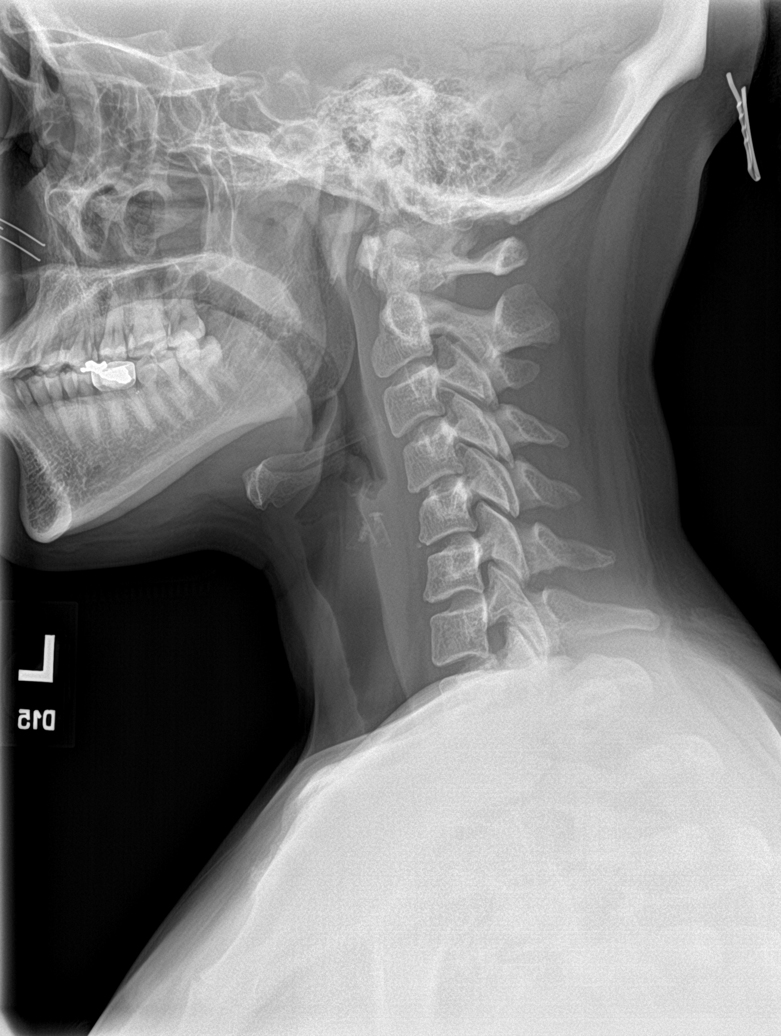
[im 2/6]
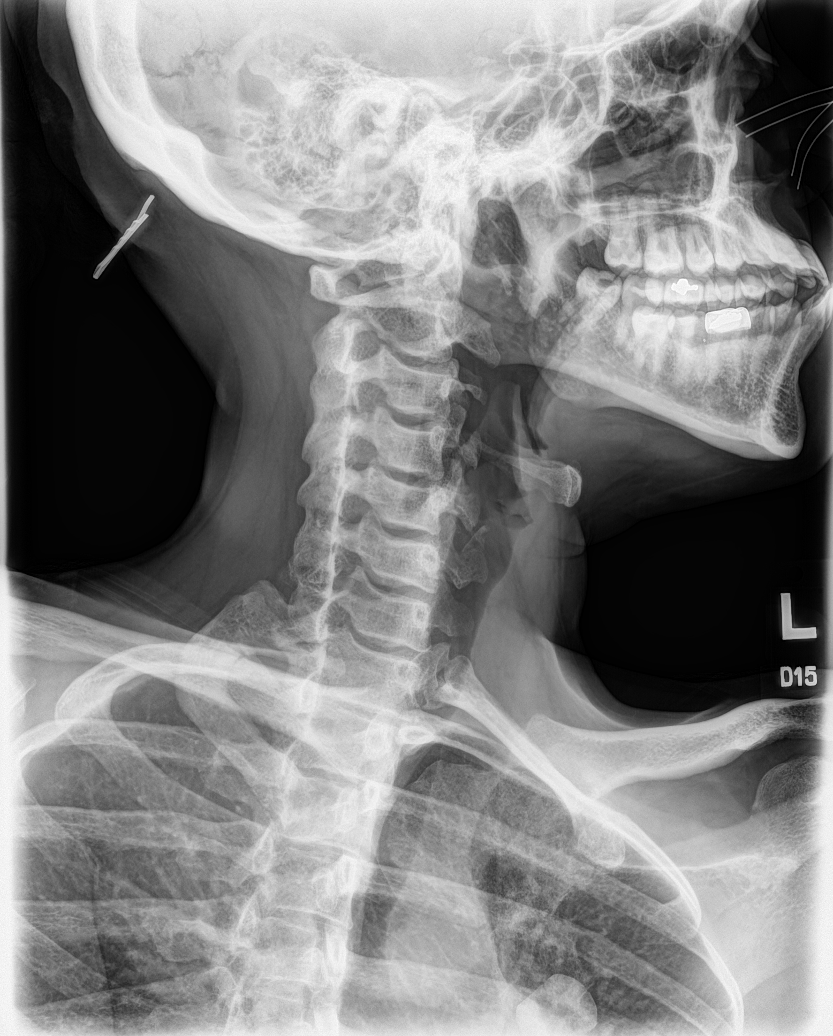
[im 3/6]
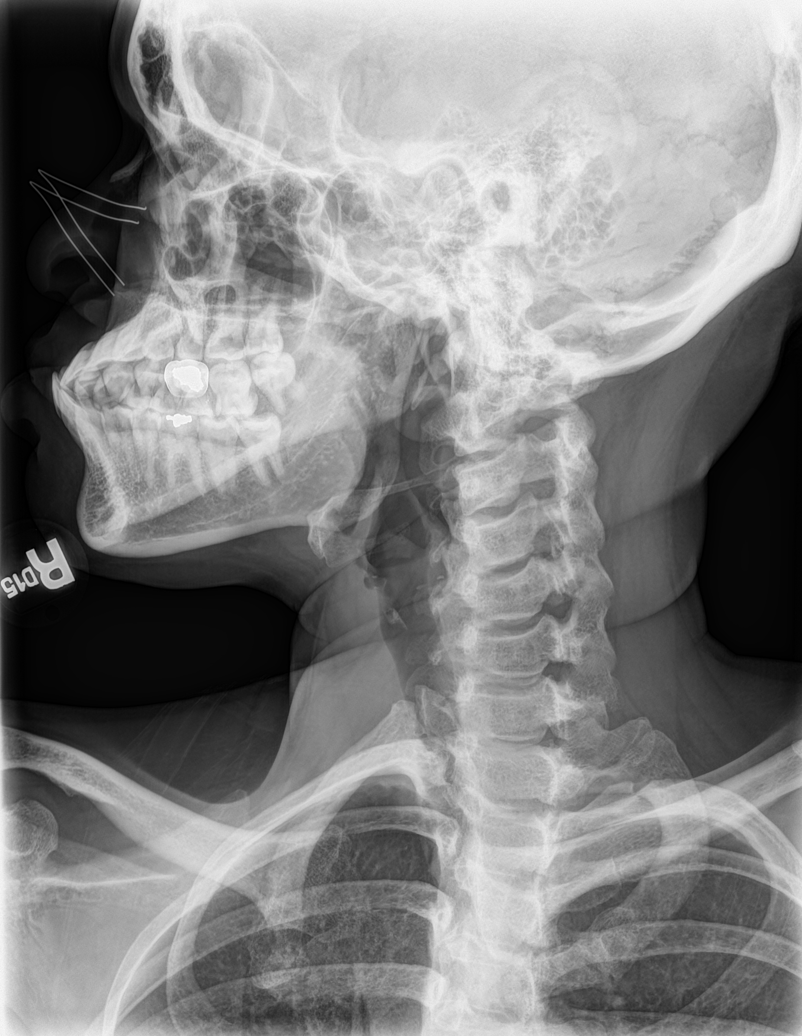
[im 4/6]
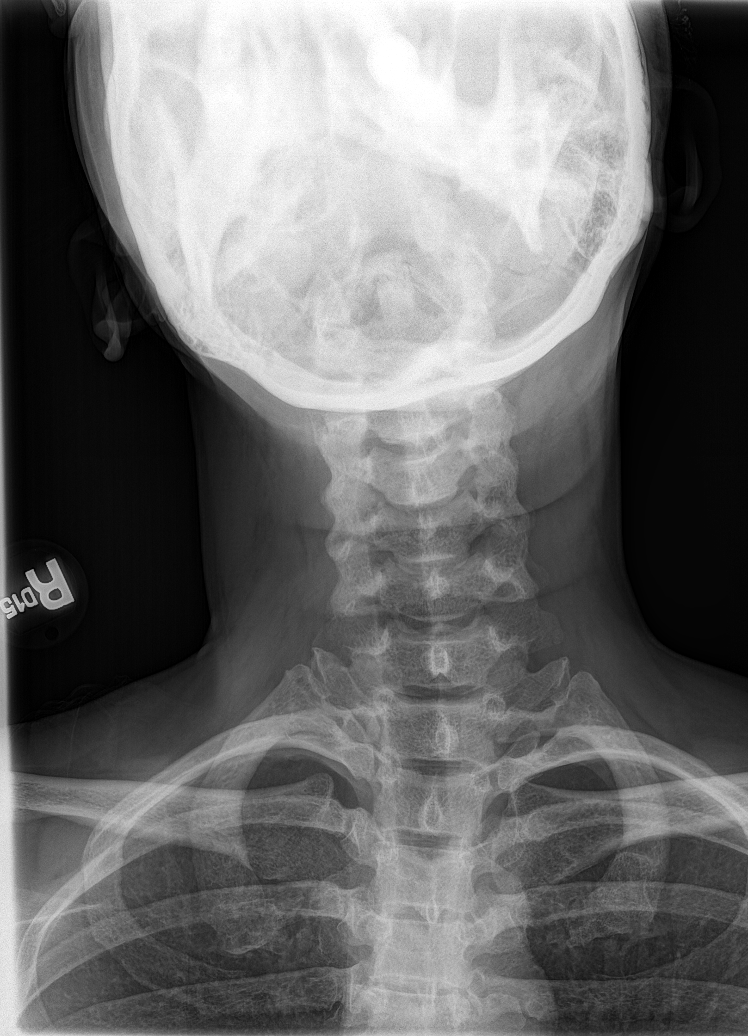
[im 5/6]
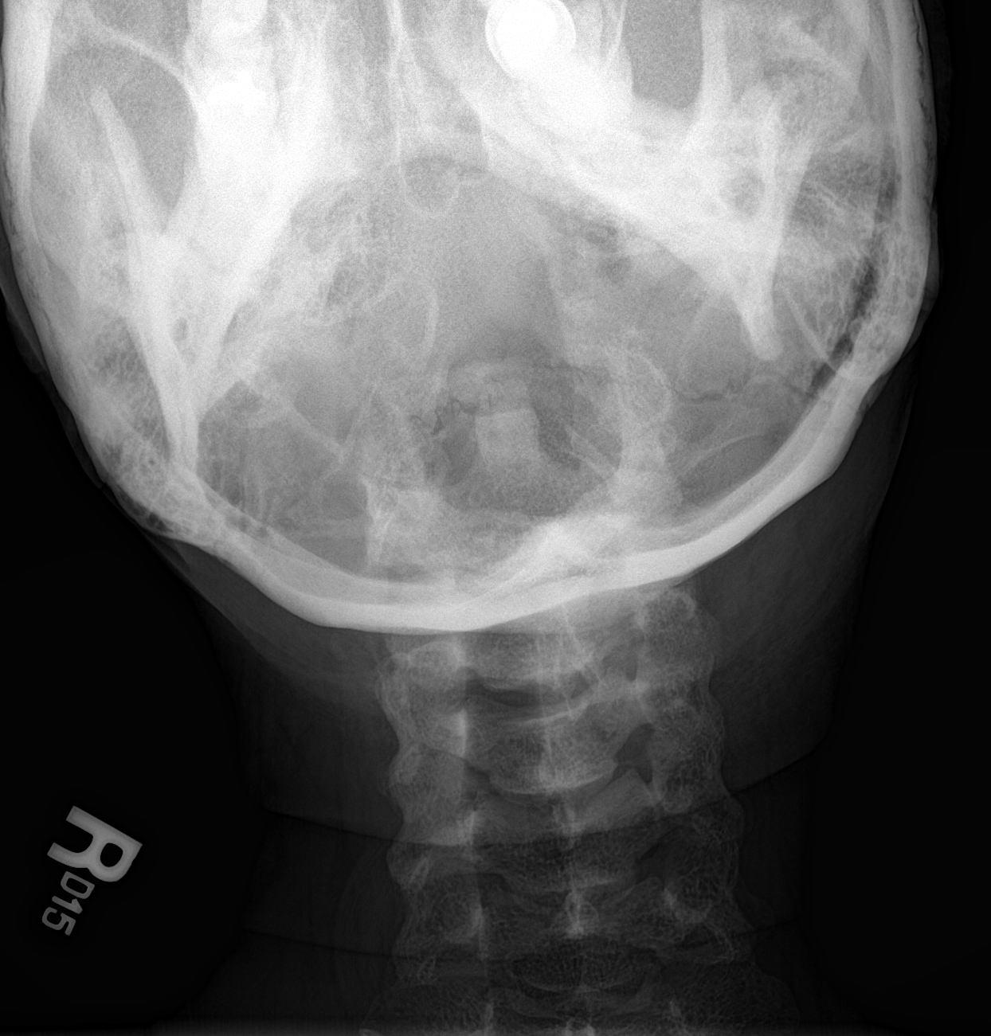
[im 6/6]
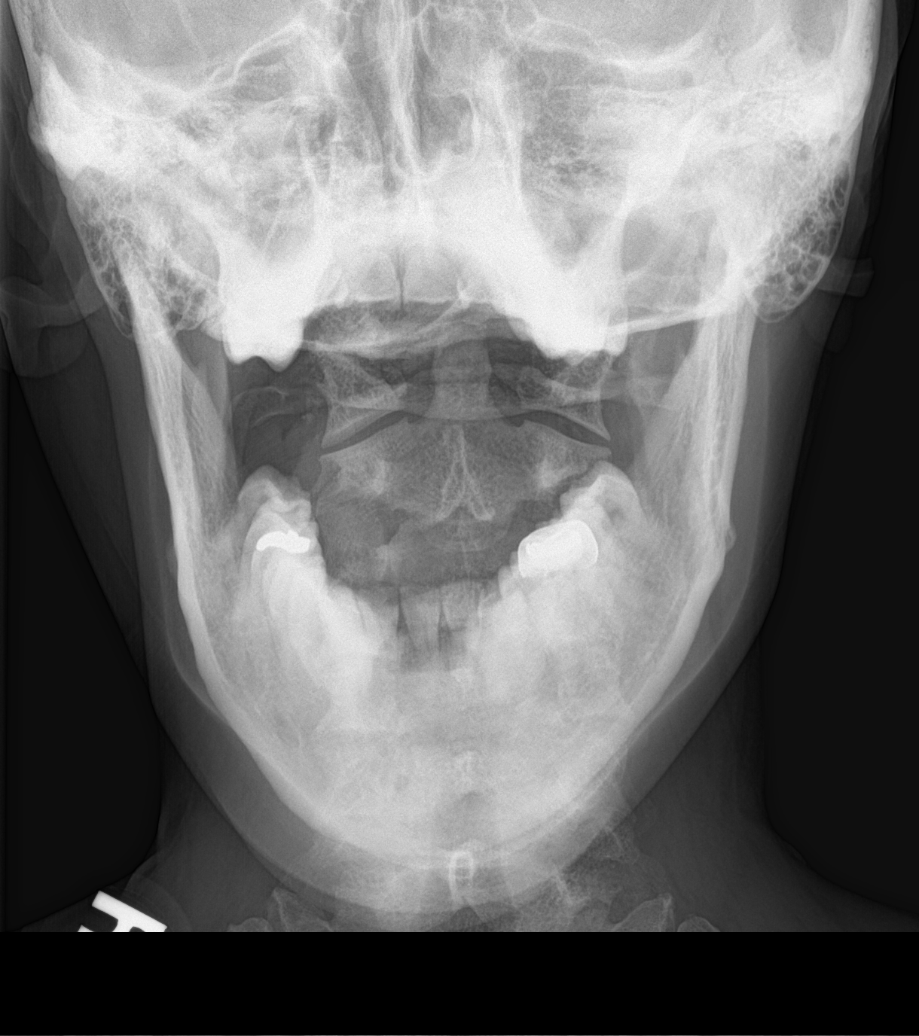

[6 of 6 positions shown; findings below may reference images not displayed]

FINDINGS: There is straightening of the normal cervical lordotic curvature.
There is no significant prevertebral soft tissue swelling. No acute
displaced fracture. There are no significant degenerative changes.
IMPRESSION: 1. No acute displaced fracture or significant degenerative changes.
2. Straightening of the normal cervical lordotic curvature may be
secondary to positioning or muscle spasm.

## 2022-10-11 ENCOUNTER — Other Ambulatory Visit: Payer: Self-pay | Admitting: Family Medicine

## 2022-10-11 ENCOUNTER — Other Ambulatory Visit (INDEPENDENT_AMBULATORY_CARE_PROVIDER_SITE_OTHER): Payer: Self-pay | Admitting: Family

## 2022-10-11 ENCOUNTER — Other Ambulatory Visit: Payer: Self-pay | Admitting: Physician Assistant

## 2022-10-11 DIAGNOSIS — J302 Other seasonal allergic rhinitis: Secondary | ICD-10-CM

## 2022-10-11 DIAGNOSIS — M62838 Other muscle spasm: Secondary | ICD-10-CM

## 2022-10-11 DIAGNOSIS — G40209 Localization-related (focal) (partial) symptomatic epilepsy and epileptic syndromes with complex partial seizures, not intractable, without status epilepticus: Secondary | ICD-10-CM

## 2022-10-20 ENCOUNTER — Ambulatory Visit (INDEPENDENT_AMBULATORY_CARE_PROVIDER_SITE_OTHER): Payer: Medicare Other

## 2022-10-20 VITALS — Ht 68.0 in | Wt 164.0 lb

## 2022-10-20 DIAGNOSIS — Z0000 Encounter for general adult medical examination without abnormal findings: Secondary | ICD-10-CM | POA: Diagnosis not present

## 2022-10-20 NOTE — Patient Instructions (Signed)
Joanna Robinson , Thank you for taking time to come for your Medicare Wellness Visit. I appreciate your ongoing commitment to your health goals. Please review the following plan we discussed and let me know if I can assist you in the future.   Referrals/Orders/Follow-Ups/Clinician Recommendations: appt PE made for pt  This is a list of the screening recommended for you and due dates:  Health Maintenance  Topic Date Due   Hepatitis C Screening  Never done   COVID-19 Vaccine (4 - 2023-24 season) 11/22/2021   Pap Smear  09/29/2023*   Flu Shot  10/23/2022   Medicare Annual Wellness Visit  10/20/2023   DTaP/Tdap/Td vaccine (4 - Td or Tdap) 10/24/2031   HPV Vaccine  Aged Out   HIV Screening  Discontinued  *Topic was postponed. The date shown is not the original due date.    Advanced directives: (Copy Requested) Please bring a copy of your health care power of attorney and living will to the office to be added to your chart at your convenience.  Next Medicare Annual Wellness Visit scheduled for next year: Yes  Preventive Care 40-64 Years, Female Preventive care refers to lifestyle choices and visits with your health care provider that can promote health and wellness. What does preventive care include? A yearly physical exam. This is also called an annual well check. Dental exams once or twice a year. Routine eye exams. Ask your health care provider how often you should have your eyes checked. Personal lifestyle choices, including: Daily care of your teeth and gums. Regular physical activity. Eating a healthy diet. Avoiding tobacco and drug use. Limiting alcohol use. Practicing safe sex. Taking low-dose aspirin daily starting at age 86. Taking vitamin and mineral supplements as recommended by your health care provider. What happens during an annual well check? The services and screenings done by your health care provider during your annual well check will depend on your age, overall health,  lifestyle risk factors, and family history of disease. Counseling  Your health care provider may ask you questions about your: Alcohol use. Tobacco use. Drug use. Emotional well-being. Home and relationship well-being. Sexual activity. Eating habits. Work and work Astronomer. Method of birth control. Menstrual cycle. Pregnancy history. Screening  You may have the following tests or measurements: Height, weight, and BMI. Blood pressure. Lipid and cholesterol levels. These may be checked every 5 years, or more frequently if you are over 58 years old. Skin check. Lung cancer screening. You may have this screening every year starting at age 23 if you have a 30-pack-year history of smoking and currently smoke or have quit within the past 15 years. Fecal occult blood test (FOBT) of the stool. You may have this test every year starting at age 24. Flexible sigmoidoscopy or colonoscopy. You may have a sigmoidoscopy every 5 years or a colonoscopy every 10 years starting at age 88. Hepatitis C blood test. Hepatitis B blood test. Sexually transmitted disease (STD) testing. Diabetes screening. This is done by checking your blood sugar (glucose) after you have not eaten for a while (fasting). You may have this done every 1-3 years. Mammogram. This may be done every 1-2 years. Talk to your health care provider about when you should start having regular mammograms. This may depend on whether you have a family history of breast cancer. BRCA-related cancer screening. This may be done if you have a family history of breast, ovarian, tubal, or peritoneal cancers. Pelvic exam and Pap test. This may be done  every 3 years starting at age 88. Starting at age 64, this may be done every 5 years if you have a Pap test in combination with an HPV test. Bone density scan. This is done to screen for osteoporosis. You may have this scan if you are at high risk for osteoporosis. Discuss your test results, treatment  options, and if necessary, the need for more tests with your health care provider. Vaccines  Your health care provider may recommend certain vaccines, such as: Influenza vaccine. This is recommended every year. Tetanus, diphtheria, and acellular pertussis (Tdap, Td) vaccine. You may need a Td booster every 10 years. Zoster vaccine. You may need this after age 60. Pneumococcal 13-valent conjugate (PCV13) vaccine. You may need this if you have certain conditions and were not previously vaccinated. Pneumococcal polysaccharide (PPSV23) vaccine. You may need one or two doses if you smoke cigarettes or if you have certain conditions. Talk to your health care provider about which screenings and vaccines you need and how often you need them. This information is not intended to replace advice given to you by your health care provider. Make sure you discuss any questions you have with your health care provider. Document Released: 04/06/2015 Document Revised: 11/28/2015 Document Reviewed: 01/09/2015 Elsevier Interactive Patient Education  2017 [employer].    Fall Prevention in the Home Falls can cause injuries. They can happen to people of all ages. There are many things you can do to make your home safe and to help prevent falls. What can I do on the outside of my home? Regularly fix the edges of walkways and driveways and fix any cracks. Remove anything that might make you trip as you walk through a door, such as a raised step or threshold. Trim any bushes or trees on the path to your home. Use bright outdoor lighting. Clear any walking paths of anything that might make someone trip, such as rocks or tools. Regularly check to see if handrails are loose or broken. Make sure that both sides of any steps have handrails. Any raised decks and porches should have guardrails on the edges. Have any leaves, snow, or ice cleared regularly. Use sand or salt on walking paths during winter. Clean up any  spills in your garage right away. This includes oil or grease spills. What can I do in the bathroom? Use night lights. Install grab bars by the toilet and in the tub and shower. Do not use towel bars as grab bars. Use non-skid mats or decals in the tub or shower. If you need to sit down in the shower, use a plastic, non-slip stool. Keep the floor dry. Clean up any water that spills on the floor as soon as it happens. Remove soap buildup in the tub or shower regularly. Attach bath mats securely with double-sided non-slip rug tape. Do not have throw rugs and other things on the floor that can make you trip. What can I do in the bedroom? Use night lights. Make sure that you have a light by your bed that is easy to reach. Do not use any sheets or blankets that are too big for your bed. They should not hang down onto the floor. Have a firm chair that has side arms. You can use this for support while you get dressed. Do not have throw rugs and other things on the floor that can make you trip. What can I do in the kitchen? Clean up any spills right away. Avoid walking on  wet floors. Keep items that you use a lot in easy-to-reach places. If you need to reach something above you, use a strong step stool that has a grab bar. Keep electrical cords out of the way. Do not use floor polish or wax that makes floors slippery. If you must use wax, use non-skid floor wax. Do not have throw rugs and other things on the floor that can make you trip. What can I do with my stairs? Do not leave any items on the stairs. Make sure that there are handrails on both sides of the stairs and use them. Fix handrails that are broken or loose. Make sure that handrails are as long as the stairways. Check any carpeting to make sure that it is firmly attached to the stairs. Fix any carpet that is loose or worn. Avoid having throw rugs at the top or bottom of the stairs. If you do have throw rugs, attach them to the floor  with carpet tape. Make sure that you have a light switch at the top of the stairs and the bottom of the stairs. If you do not have them, ask someone to add them for you. What else can I do to help prevent falls? Wear shoes that: Do not have high heels. Have rubber bottoms. Are comfortable and fit you well. Are closed at the toe. Do not wear sandals. If you use a stepladder: Make sure that it is fully opened. Do not climb a closed stepladder. Make sure that both sides of the stepladder are locked into place. Ask someone to hold it for you, if possible. Clearly mark and make sure that you can see: Any grab bars or handrails. First and last steps. Where the edge of each step is. Use tools that help you move around (mobility aids) if they are needed. These include: Canes. Walkers. Scooters. Crutches. Turn on the lights when you go into a dark area. Replace any light bulbs as soon as they burn out. Set up your furniture so you have a clear path. Avoid moving your furniture around. If any of your floors are uneven, fix them. If there are any pets around you, be aware of where they are. Review your medicines with your doctor. Some medicines can make you feel dizzy. This can increase your chance of falling. Ask your doctor what other things that you can do to help prevent falls. This information is not intended to replace advice given to you by your health care provider. Make sure you discuss any questions you have with your health care provider. Document Released: 01/04/2009 Document Revised: 08/16/2015 Document Reviewed: 04/14/2014 Elsevier Interactive Patient Education  2017 [employer].

## 2022-10-20 NOTE — Progress Notes (Signed)
Subjective:   Joanna Robinson is a 42 y.o. female who presents for Medicare Annual (Subsequent) preventive examination.  Visit Complete: Virtual  I connected with Joanna Robinson (mother and caretaker) regarding care of  Joanna Robinson on 10/20/22 by a audio enabled telemedicine application and verified that I am speaking with the correct person using two identifiers.  Patient Location: Home  Provider Location: Office/Clinic  I discussed the limitations of evaluation and management by telemedicine. The patient expressed understanding and agreed to proceed.  Vital Signs: Unable to obtain new vitals due to this being a telehealth visit.  Patient Medicare AWV questionnaire was completed by the patient on (not done); I have confirmed that all information answered by patient is correct and no changes since this date.  Review of Systems    Cardiac Risk Factors include: advanced age (>45men, >88 women);dyslipidemia    Objective:    Today's Vitals   10/20/22 0919  Weight: 164 lb (74.4 kg)  Height: 5\' 8"  (1.727 m)   Body mass index is 24.94 kg/m.     10/20/2022    9:41 AM 10/17/2021    8:26 AM  Advanced Directives  Does Patient Have a Medical Advance Directive? Yes No  Type of Advance Directive Healthcare Power of Attorney   Would patient like information on creating a medical advance directive?  No - Patient declined    Current Medications (verified) Outpatient Encounter Medications as of 10/20/2022  Medication Sig   acetaZOLAMIDE (DIAMOX) 250 MG tablet TAKE 1 TABLET BY MOUTH TWICE DAILY   azelastine (ASTELIN) 0.1 % nasal spray PLACE 1 SPRAY INTO BOTH NOSTRILS TWICE DAILY.   azelastine (OPTIVAR) 0.05 % ophthalmic solution PLACE 1 DROP IN BOTH EYES TWICE DAILY   baclofen (LIORESAL) 10 MG tablet TAKE 1 TABLET BY MOUTH TWICE DAILY   benzoyl peroxide 5 % gel Apply topically 2 (two) times daily. To affected area   brompheniramine-pseudoephedrine-DM 30-2-10 MG/5ML syrup Take 5  mLs by mouth 4 (four) times daily as needed.   carbamazepine (CARBATROL) 200 MG 12 hr capsule TAKE 1 CAPSULE BY MOUTH ONCE EVERY MORNING AND 1 CAPSULE ONCE EVERY EVENINGAS DIRECTED DOSE DECREASE*   cloNIDine (CATAPRES) 0.2 MG tablet TAKE 1 TABLET BY MOUTH AT BEDTIME   ferrous gluconate (FERGON) 324 MG tablet TAKE 1 TABLET BY MOUTH TWICE DAILY WITH MEALS   fexofenadine (ALLEGRA) 180 MG tablet TAKE 1 TABLET BY MOUTH ONCE DAILY   fluticasone (FLONASE) 50 MCG/ACT nasal spray Place into both nostrils.   fluvoxaMINE (LUVOX) 100 MG tablet TAKE 1 TABLET BY MOUTH TWICE DAILY   GNP MUCUS ER 600 MG 12 hr tablet TAKE 1 TABLET BY MOUTH TWICE DAILY FOR 15 DAYS   hydrochlorothiazide (HYDRODIURIL) 25 MG tablet TAKE 1 TABLET BY MOUTH ONCE DAILY   linaclotide (LINZESS) 145 MCG CAPS capsule Take 1 capsule (145 mcg total) by mouth daily.   MAPAP 500 MG tablet TAKE 2 TABLETS BY MOUTH 3 TIMES DAILY ASNEEDED HEADACHE   montelukast (SINGULAIR) 10 MG tablet TAKE 1 TABLET BY MOUTH ONCE EVERY EVENING   naproxen (NAPROSYN) 500 MG tablet TAKE 1 TABLET BY MOUTH TWICE DAILY   omeprazole (PRILOSEC) 40 MG capsule TAKE 1 CAPSULE BY MOUTH ONCE DAILY   potassium chloride (KLOR-CON) 20 MEQ packet Take 20 mEq by mouth daily. Mix and dissolve 1 packet in 4 oz of liquid and drink once daily   sodium fluoride (DENTA 5000 PLUS) 1.1 % CREA dental cream Place 1 application onto teeth every evening.  Brush teeth daily for 1 minute, rinse and spit.   sulfacetamide (BLEPH-10) 10 % ophthalmic solution PLACE 2 DROPS IN RIGHT EYE 4 TIMES DAILY   triamcinolone cream (KENALOG) 0.1 % APPLY TO AFFECTED AREA(s) TWICE DAILY ASDIRECTED   No facility-administered encounter medications on file as of 10/20/2022.    Allergies (verified) Codeine and Corticosteroids   History: Past Medical History:  Diagnosis Date   Autism    Epilepsy (HCC)    Hemorrhoid    Hyperlipidemia    Seizures (HCC)    Past Surgical History:  Procedure Laterality Date    NO PAST SURGERIES     Family History  Problem Relation Age of Onset   Alzheimer's disease Maternal Grandmother        Died at 23   Alzheimer's disease Paternal Grandfather        Died at 57   Social History   Socioeconomic History   Marital status: Single    Spouse name: Not on file   Number of children: Not on file   Years of education: Not on file   Highest education level: Not on file  Occupational History   Not on file  Tobacco Use   Smoking status: Never   Smokeless tobacco: Never  Vaping Use   Vaping status: Never Used  Substance and Sexual Activity   Alcohol use: No   Drug use: No   Sexual activity: Never  Other Topics Concern   Not on file  Social History Narrative   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    She has a nurse that comes to her home and they go out on field trips. (2 days a week)    She enjoys exercise, dance, and riding bike. She lives with her mother.   Social Determinants of Health   Financial Resource Strain: Low Risk  (10/20/2022)   Overall Financial Resource Strain (CARDIA)    Difficulty of Paying Living Expenses: Not hard at all  Food Insecurity: No Food Insecurity (10/20/2022)   Hunger Vital Sign    Worried About Running Out of Food in the Last Year: Never true    Ran Out of Food in the Last Year: Never true  Transportation Needs: No Transportation Needs (10/20/2022)   PRAPARE - Administrator, Civil Service (Medical): No    Lack of Transportation (Non-Medical): No  Physical Activity: Insufficiently Active (10/20/2022)   Exercise Vital Sign    Days of Exercise per Week: 2 days    Minutes of Exercise per Session: 30 min  Stress: No Stress Concern Present (10/20/2022)   [employer] of Occupational Health - Occupational Stress Questionnaire    Feeling of Stress : Not at all  Social Connections: Moderately Isolated (10/20/2022)   Social Connection and Isolation Panel [NHANES]    Frequency of Communication with Friends and  Family: Once a week    Frequency of Social Gatherings with Friends and Family: More than three times a week    Attends Religious Services: 1 to 4 times per year    Active Member of [employer] or Organizations: No    Attends Engineer, structural: Never    Marital Status: Never married    Tobacco Counseling Counseling given: Not Answered   Clinical Intake:  Pre-visit preparation completed: Yes  Pain : No/denies pain     BMI - recorded: 24.94 Nutritional Status: BMI of 19-24  Normal Nutritional Risks: None Diabetes: No  How often do you need to have someone help  you when you read instructions, pamphlets, or other written materials from your doctor or pharmacy?: 1 - Never  Interpreter Needed?: No  Comments: lives with mother Information entered by :: B.Hope Brandenburger,LPN   Activities of Daily Living    10/20/2022    9:42 AM 08/11/2022    2:21 PM  In your present state of health, do you have any difficulty performing the following activities:  Hearing? 0 0  Vision? 0 0  Difficulty concentrating or making decisions? 1 1  Walking or climbing stairs? 0 1  Dressing or bathing? 0 1  Doing errands, shopping? 1 1  Preparing Food and eating ? N   Using the Toilet? N   In the past six months, have you accidently leaked urine? N   Do you have problems with loss of bowel control? N   Managing your Medications? Y   Managing your Finances? Y   Housekeeping or managing your Housekeeping? Y     Patient Care Team: Sherlyn Hay, DO as PCP - General (Family Medicine) Kieth Brightly, MD (General Surgery) Lorie Phenix, MD as Referring Physician (Family Medicine) Elveria Rising, NP as Nurse Practitioner (Neurology)  Indicate any recent Medical Services you may have received from other than [facility] providers in the past year (date may be approximate).     Assessment:   This is a routine wellness examination for Joanna Robinson.  Hearing/Vision screen Hearing Screening -  Comments:: Adequate hearing Vision Screening - Comments:: Adequate vision Dr Clydene Pugh  Dietary issues and exercise activities discussed:     Goals Addressed             This Visit's Progress    DIET - EAT MORE FRUITS AND VEGETABLES   On track      Depression Screen    10/20/2022    9:27 AM 08/11/2022    2:21 PM 10/23/2021   11:32 AM 10/17/2021    8:25 AM 12/30/2019    7:22 PM 12/17/2018   11:25 AM 09/29/2018   10:56 AM  PHQ 2/9 Scores  PHQ - 2 Score 0 0 0 0 0 0 0  PHQ- 9 Score  0 0 0 3      Fall Risk    10/20/2022    9:22 AM 08/11/2022    2:21 PM 10/23/2021   11:32 AM 10/17/2021    8:27 AM 12/30/2019    7:22 PM  Fall Risk   Falls in the past year? 0 0 0 0 0  Number falls in past yr: 0 0 0 0 0  Injury with Fall? 0 0 0 0 0  Risk for fall due to : No Fall Risks No Fall Risks No Fall Risks No Fall Risks   Follow up Education provided;Falls prevention discussed Falls evaluation completed  Falls evaluation completed     MEDICARE RISK AT HOME:  Medicare Risk at Home - 10/20/22 0923     Any stairs in or around the home? Yes    If so, are there any without handrails? Yes    Home free of loose throw rugs in walkways, pet beds, electrical cords, etc? Yes    Adequate lighting in your home to reduce risk of falls? Yes    Life alert? No    Use of a cane, walker or w/c? Yes   uses only w/c with trips to keep up   Grab bars in the bathroom? Yes    Shower chair or bench in shower? Yes    Elevated toilet  seat or a handicapped toilet? No             TIMED UP AND GO:  Was the test performed?  No    Cognitive Function: PT AUTISTIC UNABLE TO DO QUESTIONS        Immunizations Immunization History  Administered Date(s) Administered   Influenza,inj,Quad PF,6+ Mos 01/07/2013, 01/05/2018, 02/07/2019, 12/30/2019, 01/31/2021   PFIZER(Purple Top)SARS-COV-2 Vaccination 08/16/2019, 09/06/2019, 03/09/2020   Td 12/20/2004, 10/23/2021   Tdap 08/16/2010    TDAP status: Up to  date  Flu Vaccine status: Up to date   Covid-19 vaccine status: Completed vaccines  Qualifies for Shingles Vaccine? No    Screening Tests Health Maintenance  Topic Date Due   Hepatitis C Screening  Never done   COVID-19 Vaccine (4 - 2023-24 season) 11/22/2021   PAP SMEAR-Modifier  09/29/2023 (Originally 10/16/2003)   INFLUENZA VACCINE  10/23/2022   Medicare Annual Wellness (AWV)  10/20/2023   DTaP/Tdap/Td (4 - Td or Tdap) 10/24/2031   HPV VACCINES  Aged Out   HIV Screening  Discontinued    Health Maintenance  Health Maintenance Due  Topic Date Due   Hepatitis C Screening  Never done   COVID-19 Vaccine (4 - 2023-24 season) 11/22/2021     Lung Cancer Screening: (Low Dose CT Chest recommended if Age 29-80 years, 20 pack-year currently smoking OR have quit w/in 15years.) does not qualify.   Lung Cancer Screening Referral: no  Additional Screening:  Hepatitis C Screening: does not qualify; Completed yes  Vision Screening: Recommended annual ophthalmology exams for early detection of glaucoma and other disorders of the eye. Is the patient up to date with their annual eye exam?  Yes  Who is the provider or what is the name of the office in which the patient attends annual eye exams? Dr Clydene Pugh If pt is not established with a provider, would they like to be referred to a provider to establish care? No .   Dental Screening: Recommended annual dental exams for proper oral hygiene  Diabetic Foot Exam: n/a  Community Resource Referral / Chronic Care Management: CRR required this visit?  No   CCM required this visit?  Appt scheduled with PCP     Plan:     I have personally reviewed and noted the following in the patient's chart:   Medical and social history Use of alcohol, tobacco or illicit drugs  Current medications and supplements including opioid prescriptions. Patient is not currently taking opioid prescriptions. Functional ability and status Nutritional  status Physical activity Advanced directives List of other physicians Hospitalizations, surgeries, and ER visits in previous 12 months Vitals Screenings to include cognitive, depression, and falls Referrals and appointments  In addition, I have reviewed and discussed with patient certain preventive protocols, quality metrics, and best practice recommendations. A written personalized care plan for preventive services as well as general preventive health recommendations were provided to patient.     Sue Lush, LPN   [id number]   After Visit Summary: (Declined) Due to this being a telephonic visit, with patients personalized plan was offered to patient but patient Declined AVS at this time   Nurse Notes: I spoke with pt mother and caretaker Joanna Robinson, who states pt is doing well. She relays pt is autistic and but able to do some things herself. She relays someone is with the pt at all times to assist her. Appt made for pt PE.

## 2022-10-31 ENCOUNTER — Other Ambulatory Visit: Payer: Self-pay | Admitting: Physician Assistant

## 2022-10-31 DIAGNOSIS — R0981 Nasal congestion: Secondary | ICD-10-CM

## 2022-11-05 ENCOUNTER — Encounter: Payer: Medicare Other | Admitting: Physician Assistant

## 2022-11-11 ENCOUNTER — Encounter: Payer: Medicare Other | Admitting: Family Medicine

## 2022-11-26 ENCOUNTER — Telehealth: Payer: Self-pay | Admitting: Family Medicine

## 2022-11-26 ENCOUNTER — Encounter: Payer: Self-pay | Admitting: Family Medicine

## 2022-11-26 ENCOUNTER — Ambulatory Visit (INDEPENDENT_AMBULATORY_CARE_PROVIDER_SITE_OTHER): Payer: Medicare Other | Admitting: Family Medicine

## 2022-11-26 VITALS — BP 100/72 | HR 70 | Temp 98.3°F | Resp 18 | Ht 68.0 in | Wt 168.0 lb

## 2022-11-26 DIAGNOSIS — D702 Other drug-induced agranulocytosis: Secondary | ICD-10-CM

## 2022-11-26 DIAGNOSIS — E7800 Pure hypercholesterolemia, unspecified: Secondary | ICD-10-CM

## 2022-11-26 DIAGNOSIS — Z1231 Encounter for screening mammogram for malignant neoplasm of breast: Secondary | ICD-10-CM

## 2022-11-26 DIAGNOSIS — Z0000 Encounter for general adult medical examination without abnormal findings: Secondary | ICD-10-CM

## 2022-11-26 DIAGNOSIS — G40209 Localization-related (focal) (partial) symptomatic epilepsy and epileptic syndromes with complex partial seizures, not intractable, without status epilepticus: Secondary | ICD-10-CM

## 2022-11-26 DIAGNOSIS — K5900 Constipation, unspecified: Secondary | ICD-10-CM | POA: Diagnosis not present

## 2022-11-26 DIAGNOSIS — F79 Unspecified intellectual disabilities: Secondary | ICD-10-CM

## 2022-11-26 DIAGNOSIS — R059 Cough, unspecified: Secondary | ICD-10-CM

## 2022-11-26 MED ORDER — PSEUDOEPH-BROMPHEN-DM 30-2-10 MG/5ML PO SYRP
5.0000 mL | ORAL_SOLUTION | Freq: Four times a day (QID) | ORAL | 1 refills | Status: DC | PRN
Start: 2022-11-26 — End: 2023-01-30

## 2022-11-26 NOTE — Telephone Encounter (Signed)
Received a fax from covermymeds for Pseudoeph-Bromphen-DM 30-2-10mg /14ml Syrup  GNF:[id number]

## 2022-11-26 NOTE — Progress Notes (Signed)
Complete physical exam   Patient: Joanna Robinson   DOB: 11-Aug-1980   42 y.o. Female  MRN: [id number] Visit Date: 11/26/2022  Today's healthcare provider: Sherlyn Hay, DO   Chief Complaint  Patient presents with   Annual Exam   Subjective    Joanna Robinson is a 42 y.o. female who presents today for a complete physical exam.  She reports consuming a general diet.  She walks some days with her mom and using exercise tapes  She generally feels well. She reports sleeping well. She does not have additional problems to discuss today.  HPI  Does get three meals a day - breakfast 8am, lunch 3pm, and dinner 11pm. Eats slowly.   Has small rituals she has to do before engaging inactivities  Experience brief, staring-episode-type seizures intermittently during her menstrual cycle. Patient's mother reports no acute concerns. She will tap herself when she exerperiences weird sensation and mom will call neurology office.    Past Medical History:  Diagnosis Date   Autism    Epilepsy (HCC)    Hemorrhoid    Hyperlipidemia    Seizures (HCC)    Past Surgical History:  Procedure Laterality Date   NO PAST SURGERIES     Social History   Socioeconomic History   Marital status: Single    Spouse name: Not on file   Number of children: Not on file   Years of education: Not on file   Highest education level: Not on file  Occupational History   Not on file  Tobacco Use   Smoking status: Never   Smokeless tobacco: Never  Vaping Use   Vaping status: Never Used  Substance and Sexual Activity   Alcohol use: No   Drug use: No   Sexual activity: Never  Other Topics Concern   Not on file  Social History Narrative   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    She has a nurse that comes to her home and they go out on field trips. (2 days a week)    She enjoys exercise, dance, and riding bike. She lives with her mother.   Social Determinants of Health   Financial Resource Strain:  Low Risk  (10/20/2022)   Overall Financial Resource Strain (CARDIA)    Difficulty of Paying Living Expenses: Not hard at all  Food Insecurity: No Food Insecurity (10/20/2022)   Hunger Vital Sign    Worried About Running Out of Food in the Last Year: Never true    Ran Out of Food in the Last Year: Never true  Transportation Needs: No Transportation Needs (10/20/2022)   PRAPARE - Administrator, Civil Service (Medical): No    Lack of Transportation (Non-Medical): No  Physical Activity: Insufficiently Active (10/20/2022)   Exercise Vital Sign    Days of Exercise per Week: 2 days    Minutes of Exercise per Session: 30 min  Stress: No Stress Concern Present (10/20/2022)   [employer] of Occupational Health - Occupational Stress Questionnaire    Feeling of Stress : Not at all  Social Connections: Moderately Isolated (10/20/2022)   Social Connection and Isolation Panel [NHANES]    Frequency of Communication with Friends and Family: Once a week    Frequency of Social Gatherings with Friends and Family: More than three times a week    Attends Religious Services: 1 to 4 times per year    Active Member of Clubs or Organizations: No  Attends Banker Meetings: Never    Marital Status: Never married  Intimate Partner Violence: Not At Risk (10/20/2022)   Humiliation, Afraid, Rape, and Kick questionnaire    Fear of Current or Ex-Partner: No    Emotionally Abused: No    Physically Abused: No    Sexually Abused: No   Family Status  Relation Name Status   Mother  Alive   Father  Alive   Sister  Alive       younger   MGM  Deceased   MGF  Alive   PGM  Alive   PGF  Deceased  No partnership data on file   Family History  Problem Relation Age of Onset   Alzheimer's disease Maternal Grandmother        Died at 67   Alzheimer's disease Paternal Grandfather        Died at 53   Allergies  Allergen Reactions   Codeine    Corticosteroids Nausea Only    Patient Care  Team: Sherlyn Hay, DO as PCP - General (Family Medicine) Kieth Brightly, MD (General Surgery) Lorie Phenix, MD as Referring Physician (Family Medicine) Elveria Rising, NP as Nurse Practitioner (Neurology)   Medications: Outpatient Medications Prior to Visit  Medication Sig   acetaZOLAMIDE (DIAMOX) 250 MG tablet TAKE 1 TABLET BY MOUTH TWICE DAILY   azelastine (ASTELIN) 0.1 % nasal spray PLACE 1 SPRAY INTO BOTH NOSTRILS TWICE DAILY.   azelastine (OPTIVAR) 0.05 % ophthalmic solution PLACE 1 DROP IN BOTH EYES TWICE DAILY   baclofen (LIORESAL) 10 MG tablet TAKE 1 TABLET BY MOUTH TWICE DAILY   benzoyl peroxide 5 % gel Apply topically 2 (two) times daily. To affected area   carbamazepine (CARBATROL) 200 MG 12 hr capsule TAKE 1 CAPSULE BY MOUTH ONCE EVERY MORNING AND 1 CAPSULE ONCE EVERY EVENINGAS DIRECTED DOSE DECREASE*   cloNIDine (CATAPRES) 0.2 MG tablet TAKE 1 TABLET BY MOUTH AT BEDTIME   ferrous gluconate (FERGON) 324 MG tablet TAKE 1 TABLET BY MOUTH TWICE DAILY WITH MEALS   fexofenadine (ALLEGRA) 180 MG tablet TAKE 1 TABLET BY MOUTH ONCE DAILY   fluticasone (FLONASE) 50 MCG/ACT nasal spray Place into both nostrils.   fluvoxaMINE (LUVOX) 100 MG tablet TAKE 1 TABLET BY MOUTH TWICE DAILY   GNP MUCUS ER 600 MG 12 hr tablet TAKE 1 TABLET BY MOUTH TWICE DAILY FOR 15 DAYS   hydrochlorothiazide (HYDRODIURIL) 25 MG tablet TAKE 1 TABLET BY MOUTH ONCE DAILY   linaclotide (LINZESS) 145 MCG CAPS capsule Take 1 capsule (145 mcg total) by mouth daily.   MAPAP 500 MG tablet TAKE 2 TABLETS BY MOUTH 3 TIMES DAILY ASNEEDED HEADACHE   montelukast (SINGULAIR) 10 MG tablet TAKE 1 TABLET BY MOUTH ONCE EVERY EVENING   naproxen (NAPROSYN) 500 MG tablet TAKE 1 TABLET BY MOUTH TWICE DAILY   omeprazole (PRILOSEC) 40 MG capsule TAKE 1 CAPSULE BY MOUTH ONCE DAILY   potassium chloride (KLOR-CON) 20 MEQ packet Take 20 mEq by mouth daily. Mix and dissolve 1 packet in 4 oz of liquid and drink once daily    sodium fluoride (DENTA 5000 PLUS) 1.1 % CREA dental cream Place 1 application onto teeth every evening. Brush teeth daily for 1 minute, rinse and spit.   sulfacetamide (BLEPH-10) 10 % ophthalmic solution PLACE 2 DROPS IN RIGHT EYE 4 TIMES DAILY   triamcinolone cream (KENALOG) 0.1 % APPLY TO AFFECTED AREA(s) TWICE DAILY ASDIRECTED   [DISCONTINUED] brompheniramine-pseudoephedrine-DM 30-2-10 MG/5ML syrup Take 5 mLs by  mouth 4 (four) times daily as needed.   No facility-administered medications prior to visit.    Review of Systems  Constitutional:  Negative for appetite change, chills, fatigue and fever.  HENT:  Positive for sore throat (around menstrual cycle).   Eyes:  Positive for pain (around menstrual cycle).  Respiratory:  Negative for chest tightness and shortness of breath.   Cardiovascular:  Negative for chest pain and palpitations.  Gastrointestinal:  Positive for abdominal pain (around menstrual cycle) and constipation (takes linzess). Negative for diarrhea, nausea and vomiting.  Genitourinary:  Negative for dysuria, enuresis and frequency.  Neurological:  Positive for headaches (around menstrual cycle). Negative for dizziness, seizures (none this year) and weakness.      Objective    BP 100/72 (BP Location: Right Arm, Patient Position: Sitting, Cuff Size: Normal)   Pulse 70   Temp 98.3 F (36.8 C) (Oral)   Resp 18   Ht 5\' 8"  (1.727 m)   Wt 168 lb (76.2 kg)   BMI 25.54 kg/m    Physical Exam Vitals reviewed.  Constitutional:      General: She is not in acute distress.    Appearance: Normal appearance. She is well-developed. She is not diaphoretic.  HENT:     Head: Normocephalic and atraumatic.     Right Ear: Tympanic membrane, ear canal and external ear normal.     Left Ear: Tympanic membrane, ear canal and external ear normal.     Nose: Nose normal.     Mouth/Throat:     Mouth: Mucous membranes are moist.     Pharynx: Oropharynx is clear. No oropharyngeal exudate.   Eyes:     General: No scleral icterus.    Conjunctiva/sclera: Conjunctivae normal.     Pupils: Pupils are equal, round, and reactive to light.  Neck:     Thyroid: No thyromegaly.  Cardiovascular:     Rate and Rhythm: Normal rate and regular rhythm.     Pulses: Normal pulses.     Heart sounds: Normal heart sounds. No murmur heard. Pulmonary:     Effort: Pulmonary effort is normal. No respiratory distress.     Breath sounds: Normal breath sounds. No wheezing or rales.  Abdominal:     General: There is no distension.     Palpations: Abdomen is soft. There is no mass.     Tenderness: There is no abdominal tenderness. There is no guarding.  Musculoskeletal:        General: No deformity.     Cervical back: Neck supple.     Right lower leg: No edema.     Left lower leg: No edema.  Lymphadenopathy:     Cervical: No cervical adenopathy.  Skin:    General: Skin is warm and dry.     Findings: No rash.  Neurological:     Mental Status: She is alert and oriented to person, place, and time. Mental status is at baseline.     Gait: Gait normal.  Psychiatric:        Mood and Affect: Mood normal.        Behavior: Behavior normal.        Thought Content: Thought content normal.      Last depression screening scores    10/20/2022    9:27 AM 08/11/2022    2:21 PM 10/23/2021   11:32 AM  PHQ 2/9 Scores  PHQ - 2 Score 0 0 0  PHQ- 9 Score  0 0   Last fall risk  screening    10/20/2022    9:22 AM  Fall Risk   Falls in the past year? 0  Number falls in past yr: 0  Injury with Fall? 0  Risk for fall due to : No Fall Risks  Follow up Education provided;Falls prevention discussed   Last Audit-C alcohol use screening    10/20/2022    9:26 AM  Alcohol Use Disorder Test (AUDIT)  1. How often do you have a drink containing alcohol? 0  2. How many drinks containing alcohol do you have on a typical day when you are drinking? 0  3. How often do you have six or more drinks on one occasion? 0   AUDIT-C Score 0   A score of 3 or more in women, and 4 or more in men indicates increased risk for alcohol abuse, EXCEPT if all of the points are from question 1     Assessment & Plan    Routine Health Maintenance and Physical Exam  Exercise Activities and Dietary recommendations  Goals      DIET - EAT MORE FRUITS AND VEGETABLES        Immunization History  Administered Date(s) Administered   Influenza,inj,Quad PF,6+ Mos 01/07/2013, 01/05/2018, 02/07/2019, 12/30/2019, 01/31/2021   PFIZER(Purple Top)SARS-COV-2 Vaccination 08/16/2019, 09/06/2019, 03/09/2020   Td 12/20/2004, 10/23/2021   Tdap 08/16/2010    Health Maintenance  Topic Date Due   INFLUENZA VACCINE  10/23/2022   COVID-19 Vaccine (4 - 2023-24 season) 11/23/2022   PAP SMEAR-Modifier  09/29/2023 (Originally 10/16/2003)   Medicare Annual Wellness (AWV)  10/20/2023   DTaP/Tdap/Td (4 - Td or Tdap) 10/24/2031   Hepatitis C Screening  Completed   HPV VACCINES  Aged Out   HIV Screening  Discontinued    Discussed health benefits of physical activity, and encouraged her to engage in regular exercise appropriate for her age and condition.   Partial epilepsy with impairment of consciousness (HCC) Assessment & Plan: Stable. Had video visit with neurology in June. Will recheck Tegretol level today. No acute concerns.  Continue to monitor.   Orders: -     Carbamazepine level, total -     Interpretation:  Other drug-induced neutropenia (HCC) Assessment & Plan: Chronic and stable. Hx normal hematology workup  Orders: -     CBC  Intellectual disability Assessment & Plan: Lives with and is cared for by her mother. No acute concerns.  Continue to monitor.   Pure hypercholesterolemia Assessment & Plan: Will recheck today. Patient's mother has been having the patient increase her activity levels. Otherwise no acute concerns.  Orders: -     Lipid panel  Annual physical exam -     Comprehensive metabolic  panel -     HCV Ab w Reflex to Quant PCR  Cough -     Pseudoeph-Bromphen-DM; Take 5 mLs by mouth 4 (four) times daily as needed.  Dispense: 120 mL; Refill: 1  Encounter for screening mammogram for breast cancer  Constipation, unspecified constipation type Assessment & Plan: Stable Continue Linzess.   Cough medicine refilled as noted above.  No acute concerns.  Return in about 6 months (around 05/26/2023) for chronic f/u.     I discussed the assessment and treatment plan with the patient  The patient was provided an opportunity to ask questions and all were answered. The patient agreed with the plan and demonstrated an understanding of the instructions.   The patient was advised to call back or seek an in-person evaluation if the  symptoms worsen or if the condition fails to improve as anticipated.    Sherlyn Hay, DO  [facility] [phone] (phone) [phone] (fax)  [facility]

## 2022-11-27 ENCOUNTER — Other Ambulatory Visit: Payer: Self-pay | Admitting: Family Medicine

## 2022-11-27 DIAGNOSIS — Z1231 Encounter for screening mammogram for malignant neoplasm of breast: Secondary | ICD-10-CM

## 2022-11-28 DIAGNOSIS — G40209 Localization-related (focal) (partial) symptomatic epilepsy and epileptic syndromes with complex partial seizures, not intractable, without status epilepticus: Secondary | ICD-10-CM | POA: Diagnosis not present

## 2022-11-28 DIAGNOSIS — Z0000 Encounter for general adult medical examination without abnormal findings: Secondary | ICD-10-CM | POA: Diagnosis not present

## 2022-11-28 DIAGNOSIS — D702 Other drug-induced agranulocytosis: Secondary | ICD-10-CM | POA: Diagnosis not present

## 2022-11-28 DIAGNOSIS — E7800 Pure hypercholesterolemia, unspecified: Secondary | ICD-10-CM | POA: Diagnosis not present

## 2022-11-29 LAB — COMPREHENSIVE METABOLIC PANEL
ALT: 7 IU/L (ref 0–32)
AST: 11 IU/L (ref 0–40)
Albumin: 4.3 g/dL (ref 3.9–4.9)
Alkaline Phosphatase: 87 IU/L (ref 44–121)
BUN/Creatinine Ratio: 14 (ref 9–23)
BUN: 11 mg/dL (ref 6–24)
Bilirubin Total: <0.2 mg/dL (ref 0.0–1.2)
CO2: 20 mmol/L (ref 20–29)
Calcium: 9.7 mg/dL (ref 8.7–10.2)
Chloride: 105 mmol/L (ref 96–106)
Creatinine, Ser: 0.77 mg/dL (ref 0.57–1.00)
Globulin, Total: 2.5 g/dL (ref 1.5–4.5)
Glucose: 103 mg/dL — ABNORMAL HIGH (ref 70–99)
Potassium: 3.9 mmol/L (ref 3.5–5.2)
Sodium: 139 mmol/L (ref 134–144)
Total Protein: 6.8 g/dL (ref 6.0–8.5)
eGFR: 100 mL/min/{1.73_m2} (ref >59)

## 2022-11-29 LAB — HCV INTERPRETATION

## 2022-11-29 LAB — LIPID PANEL
Chol/HDL Ratio: 2.7 ratio (ref 0.0–4.4)
Cholesterol, Total: 230 mg/dL — ABNORMAL HIGH (ref 100–199)
HDL: 86 mg/dL (ref >39)
LDL Chol Calc (NIH): 137 mg/dL — ABNORMAL HIGH (ref 0–99)
Triglycerides: 44 mg/dL (ref 0–149)
VLDL Cholesterol Cal: 7 mg/dL (ref 5–40)

## 2022-11-29 LAB — CBC
Hematocrit: 41 % (ref 34.0–46.6)
Hemoglobin: 13.1 g/dL (ref 11.1–15.9)
MCH: 29.7 pg (ref 26.6–33.0)
MCHC: 32 g/dL (ref 31.5–35.7)
MCV: 93 fL (ref 79–97)
Platelets: 164 10*3/uL (ref 150–450)
RBC: 4.41 x10E6/uL (ref 3.77–5.28)
RDW: 11.5 % — ABNORMAL LOW (ref 11.7–15.4)
WBC: 2.8 10*3/uL — ABNORMAL LOW (ref 3.4–10.8)

## 2022-11-29 LAB — HCV AB W REFLEX TO QUANT PCR: HCV Ab: NONREACTIVE

## 2022-11-29 LAB — CARBAMAZEPINE LEVEL, TOTAL: Carbamazepine (Tegretol), S: 11.7 ug/mL (ref 4.0–12.0)

## 2022-12-03 NOTE — Telephone Encounter (Signed)
PA sent to [facility].

## 2022-12-04 ENCOUNTER — Telehealth: Payer: Self-pay | Admitting: *Deleted

## 2022-12-04 NOTE — Telephone Encounter (Signed)
Pt guardian [name] given lab results per notes of Dr. Jacquenette Shone from 12/01/22 on 12/04/22. Pt mother verbalized understanding and reports patient has had WBC levels evaluated by [facility].

## 2022-12-05 NOTE — Telephone Encounter (Signed)
Outcome Denied on September 11 by [facility] Medicare 2017 Denied. Medicare law excludes certain drugs from being covered by Part D. This drug is one of those excluded drugs because it is a cough and cold product and so it cannot be paid by your Medicare Part D. Drug Pseudoeph-Bromphen-DM 30-2-10MG /5ML syrup

## 2022-12-07 NOTE — Assessment & Plan Note (Signed)
Will recheck today. Patient's mother has been having the patient increase her activity levels. Otherwise no acute concerns.

## 2022-12-07 NOTE — Assessment & Plan Note (Signed)
Lives with and is cared for by her mother. No acute concerns.  Continue to monitor.

## 2022-12-07 NOTE — Assessment & Plan Note (Signed)
Chronic and stable. Hx normal hematology workup

## 2022-12-07 NOTE — Assessment & Plan Note (Signed)
Stable. Had video visit with neurology in June. Will recheck Tegretol level today. No acute concerns.  Continue to monitor.

## 2022-12-07 NOTE — Assessment & Plan Note (Signed)
Stable. Continue Linzess

## 2022-12-15 ENCOUNTER — Other Ambulatory Visit: Payer: Self-pay | Admitting: Family Medicine

## 2022-12-15 DIAGNOSIS — M62838 Other muscle spasm: Secondary | ICD-10-CM

## 2022-12-16 ENCOUNTER — Other Ambulatory Visit: Payer: Self-pay | Admitting: Family Medicine

## 2022-12-16 DIAGNOSIS — M62838 Other muscle spasm: Secondary | ICD-10-CM

## 2022-12-16 NOTE — Telephone Encounter (Unsigned)
Copied from CRM [id number]. Topic: General - Other >> Dec 16, 2022  3:45 PM Everette C wrote: Reason for CRM: Medication Refill - Medication: baclofen (LIORESAL) 10 MG tablet [[id number]]  Has the patient contacted their pharmacy? Yes.   (Agent: If no, request that the patient contact the pharmacy for the refill. If patient does not wish to contact the pharmacy document the reason why and proceed with request.) (Agent: If yes, when and what did the pharmacy advise?)  Preferred Pharmacy (with phone number or street name): [facility] [place] Name - [address] [place] [address] Phone: [phone] Fax: [phone] Hours: Not open 24 hours   Has the patient been seen for an appointment in the last year OR does the patient have an upcoming appointment? Yes.    Agent: Please be advised that RX refills may take up to 3 business days. We ask that you follow-up with your pharmacy.

## 2022-12-17 MED ORDER — BACLOFEN 10 MG PO TABS
10.0000 mg | ORAL_TABLET | Freq: Two times a day (BID) | ORAL | 1 refills | Status: DC
Start: 2022-12-17 — End: 2023-02-07

## 2022-12-17 NOTE — Addendum Note (Signed)
Addended by: Jacquenette Shone on: 12/17/2022 08:27 AM   Modules accepted: Level of Service

## 2022-12-31 ENCOUNTER — Other Ambulatory Visit: Payer: Self-pay | Admitting: Physician Assistant

## 2023-01-09 ENCOUNTER — Other Ambulatory Visit: Payer: Self-pay | Admitting: Family Medicine

## 2023-01-09 ENCOUNTER — Other Ambulatory Visit: Payer: Self-pay | Admitting: Physician Assistant

## 2023-01-09 DIAGNOSIS — K219 Gastro-esophageal reflux disease without esophagitis: Secondary | ICD-10-CM

## 2023-01-09 DIAGNOSIS — N946 Dysmenorrhea, unspecified: Secondary | ICD-10-CM

## 2023-01-09 NOTE — Telephone Encounter (Signed)
Requested Prescriptions  Pending Prescriptions Disp Refills   fluvoxaMINE (LUVOX) 100 MG tablet [Pharmacy Med Name: FLUVOXAMINE MALEATE 100 MG TAB] 180 tablet 0    Sig: TAKE 1 TABLET BY MOUTH TWICE DAILY     Psychiatry:  Antidepressants - SSRI Passed - 01/09/2023  4:52 PM      Passed - Valid encounter within last 6 months    Recent Outpatient Visits           1 month ago Partial epilepsy with impairment of consciousness [facility])   [facility], Monico Blitz, DO   5 months ago Subconjunctival hemorrhage of right eye   [facility] [name], PA-C   9 months ago Preop general physical exam   [facility] [name], PA-C   1 year ago Annual physical exam   [facility] [name], PA-C   1 year ago Subacute frontal sinusitis   [facility] Caro Laroche, DO

## 2023-01-12 ENCOUNTER — Other Ambulatory Visit: Payer: Self-pay | Admitting: Family Medicine

## 2023-01-12 DIAGNOSIS — N946 Dysmenorrhea, unspecified: Secondary | ICD-10-CM

## 2023-01-12 DIAGNOSIS — K219 Gastro-esophageal reflux disease without esophagitis: Secondary | ICD-10-CM

## 2023-01-12 NOTE — Telephone Encounter (Signed)
Medication Refill - Medication: naproxen (NAPROSYN) 500 MG tablet and omeprazole (PRILOSEC) 40 MG capsule   Has the patient contacted their pharmacy? Yes.    Preferred Pharmacy (with phone number or street name):  [facility] [place] [place] - [address] Phone: [phone]  Fax: [phone]     Has the patient been seen for an appointment in the last year OR does the patient have an upcoming appointment? Yes.    Agent: Please be advised that RX refills may take up to 3 business days. We ask that you follow-up with your pharmacy.

## 2023-01-13 NOTE — Telephone Encounter (Signed)
Request was too soon for refill, last refill 01/12/23.E-Prescribing Status: Receipt confirmed by pharmacy (01/12/2023  5:15 PM EDT).  Requested Prescriptions  Pending Prescriptions Disp Refills   naproxen (NAPROSYN) 500 MG tablet 180 tablet 1    Sig: TAKE 1 TABLET BY MOUTH TWICE DAILY     Analgesics:  NSAIDS Failed - 01/12/2023 11:40 AM      Failed - Manual Review: Labs are only required if the patient has taken medication for more than 8 weeks.      Passed - Cr in normal range and within 360 days    Creatinine, Ser  Date Value Ref Range Status  11/28/2022 0.77 0.57 - 1.00 mg/dL Final         Passed - HGB in normal range and within 360 days    Hemoglobin  Date Value Ref Range Status  11/28/2022 13.1 11.1 - 15.9 g/dL Final         Passed - PLT in normal range and within 360 days    Platelets  Date Value Ref Range Status  11/28/2022 164 150 - 450 x10E3/uL Final         Passed - HCT in normal range and within 360 days    Hematocrit  Date Value Ref Range Status  11/28/2022 41.0 34.0 - 46.6 % Final         Passed - eGFR is 30 or above and within 360 days    GFR calc Af Amer  Date Value Ref Range Status  05/16/2020 124 >59 mL/min/1.73 Final    Comment:    **In accordance with recommendations from the NKF-ASN Task force,**   [facility] is in the process of updating its eGFR calculation to the   2021 CKD-EPI creatinine equation that estimates kidney function   without a race variable.    GFR calc non Af Amer  Date Value Ref Range Status  05/16/2020 107 >59 mL/min/1.73 Final   eGFR  Date Value Ref Range Status  11/28/2022 100 >59 mL/min/1.73 Final         Passed - Patient is not pregnant      Passed - Valid encounter within last 12 months    Recent Outpatient Visits           1 month ago Partial epilepsy with impairment of consciousness [facility])   [facility] [place], Monico Blitz, DO   5 months ago Subconjunctival hemorrhage of right eye   [facility] [name], [facility]-C   9 months ago Preop general physical exam   [place] [facility] [name], PA-C   1 year ago Annual physical exam   [facility] [name], PA-C   1 year ago Subacute frontal sinusitis   [facility] [place] Dense M, DO               omeprazole (PRILOSEC) 40 MG capsule 90 capsule 3    Sig: Take 1 capsule (40 mg total) by mouth daily.     Gastroenterology: Proton Pump Inhibitors Passed - 01/12/2023 11:40 AM      Passed - Valid encounter within last 12 months    Recent Outpatient Visits           1 month ago Partial epilepsy with impairment of consciousness [facility])   [place] [facility] [place], Monico Blitz, DO   5 months ago Subconjunctival hemorrhage of right eye   [facility] [name], PA-C   9 months ago Preop general physical exam   [facility] [name], [place]   1 year ago Annual physical exam   [facility] [name], PA-C   1 year ago Subacute frontal sinusitis   [facility] Caro Laroche, [place]

## 2023-01-30 ENCOUNTER — Other Ambulatory Visit: Payer: Self-pay | Admitting: Family Medicine

## 2023-01-30 DIAGNOSIS — R059 Cough, unspecified: Secondary | ICD-10-CM

## 2023-01-30 MED ORDER — PSEUDOEPH-BROMPHEN-DM 30-2-10 MG/5ML PO SYRP: 5.0000 mL | ORAL_SOLUTION | Freq: Four times a day (QID) | ORAL | 1 refills | Status: DC | PRN

## 2023-01-30 NOTE — Progress Notes (Signed)
Will resend Pseudoeph-Bromphen-DM as patient has medicaid as well as medicare and it should be covered under the medicaid.

## 2023-02-06 ENCOUNTER — Other Ambulatory Visit: Payer: Self-pay | Admitting: Physician Assistant

## 2023-02-06 ENCOUNTER — Other Ambulatory Visit: Payer: Self-pay | Admitting: Family Medicine

## 2023-02-06 DIAGNOSIS — M62838 Other muscle spasm: Secondary | ICD-10-CM

## 2023-02-06 DIAGNOSIS — H1013 Acute atopic conjunctivitis, bilateral: Secondary | ICD-10-CM

## 2023-02-12 ENCOUNTER — Other Ambulatory Visit: Payer: Self-pay | Admitting: Family Medicine

## 2023-02-12 DIAGNOSIS — R0981 Nasal congestion: Secondary | ICD-10-CM

## 2023-02-13 ENCOUNTER — Other Ambulatory Visit: Payer: Self-pay | Admitting: Family Medicine

## 2023-02-17 DIAGNOSIS — H1045 Other chronic allergic conjunctivitis: Secondary | ICD-10-CM | POA: Diagnosis not present

## 2023-03-06 ENCOUNTER — Other Ambulatory Visit: Payer: Self-pay | Admitting: Family Medicine

## 2023-03-06 DIAGNOSIS — M62838 Other muscle spasm: Secondary | ICD-10-CM

## 2023-03-06 NOTE — Telephone Encounter (Signed)
Last reorder 02/07/23 #60 1 RF  Requested Prescriptions  Refused Prescriptions Disp Refills   baclofen (LIORESAL) 10 MG tablet [Pharmacy Med Name: BACLOFEN 10 MG TAB] 60 each 1    Sig: TAKE 1 TABLET BY MOUTH TWICE DAILY     Analgesics:  Muscle Relaxants - baclofen Passed - 03/06/2023  2:27 PM      Passed - Cr in normal range and within 180 days    Creatinine, Ser  Date Value Ref Range Status  11/28/2022 0.77 0.57 - 1.00 mg/dL Final         Passed - eGFR is 30 or above and within 180 days    GFR calc Af Amer  Date Value Ref Range Status  05/16/2020 124 >59 mL/min/1.73 Final    Comment:    **In accordance with recommendations from the NKF-ASN Task force,**   [facility] is in the process of updating its eGFR calculation to the   2021 CKD-EPI creatinine equation that estimates kidney function   without a race variable.    GFR calc non Af Amer  Date Value Ref Range Status  05/16/2020 107 >59 mL/min/1.73 Final   eGFR  Date Value Ref Range Status  11/28/2022 100 >59 mL/min/1.73 Final         Passed - Valid encounter within last 6 months    Recent Outpatient Visits           3 months ago Partial epilepsy with impairment of consciousness [facility])   [facility], Monico Blitz, DO   6 months ago Subconjunctival hemorrhage of right eye   [facility] [name], PA-C   11 months ago Preop general physical exam   [facility] [name], PA-C   1 year ago Annual physical exam   [facility] [name], PA-C   1 year ago Subacute frontal sinusitis   [facility] Caro Laroche, DO

## 2023-03-24 ENCOUNTER — Ambulatory Visit: Payer: Self-pay | Admitting: *Deleted

## 2023-03-24 ENCOUNTER — Telehealth: Payer: Self-pay | Admitting: Family Medicine

## 2023-03-24 NOTE — Telephone Encounter (Signed)
 Parent called back and wanted to speak with provider and provider stated I'm about to see a patient, so I can't have a prolonged conversation (as I expect it will be) until after. Do they have a cuff at home? Joanna Robinson was trying to get her to come in before we close this afternoon .

## 2023-03-24 NOTE — Telephone Encounter (Signed)
PEC stated that patient has taken the wrong medication and wanted you to know that EMS has been called.

## 2023-03-24 NOTE — Telephone Encounter (Signed)
  Chief Complaint: patient took wrong medication this am approx. 950 am took loasartan 100 mg of her mother's medication by mistake.  Symptoms: per pt mother none now. Mother reports patient took green pill losartan instead of patient's pill. Pt mother reports patient has did not take all of her medications this am but after checking with mother again , mother reports she does not know which medications are which and patient has taken all of her prescribed medications which includes hydrodiuril  25 mg and clonidine .  Frequency: 0950 Pertinent Negatives: Patient denies chest pain no change in color no clammy skin no difficulty breathing.  Disposition: [x] ED /[] Urgent Care (no appt availability in office) / [] Appointment(In office/virtual)/ []  [place] [facility]/ [] Home Care/ [] Refused Recommended Disposition /[] [place] [facility]/ []  Follow-up with PCP Additional Notes:   Patient mother calling poison control and NT contacted practice to see if patient could be seen today . No available appt . Call disconnected after calling practice and NT checking on patient and pt mother anxious and panicked. Patient mother asked again what medications patient did not take from her prescribed medications this am and now patient's mother reports I dont know  which medications she took because they are all in a bubble wrap.  NT called 911 for EMS to come to home and evaluate patient. Called patient mother back to notify of EMS coming no answer, LVMTCB. FC Nicole notified NT called EMS to assess patient.            Reason for Disposition  Took another person's prescription drug  Answer Assessment - Initial Assessment Questions 1. SUBSTANCE: What was swallowed? If necessary, have the caller look at the product or drug label on the container to determine active ingredients.     BP medication  losartan 100 mg per pt mother  2. AMOUNT: How much was swallowed? (e.g., what was the possible maximum  amount)      Whole pill 3. ONSET: When was it probably swallowed? (Minutes or hours ago)      950 this am  4. SYMPTOMS: Do you have any symptoms? If Yes, ask: What are they? (e.g., abdomen pain, vomiting, weakness)      no 5. TREATMENT: Have you done anything to treat this? If Yes, ask: What did you do?     no 6. SUICIDAL: Did you take this to hurt or kill yourself?     na 7. PREGNANCY: Is there any chance you are pregnant? When was your last menstrual period?     na  Protocols used: Poisoning-A-AH

## 2023-03-26 ENCOUNTER — Ambulatory Visit: Payer: Medicare Other | Admitting: Family Medicine

## 2023-03-26 NOTE — Progress Notes (Deleted)
      Acute visit   Patient: Joanna Robinson   DOB: Jun 30, 1980   42 y.o. Female  MRN: [id number]  No chief complaint on file.  Subjective    Discussed the use of AI scribe software for clinical note transcription with the patient, who gave verbal consent to proceed.  History of Present Illness            Review of Systems  Objective    There were no vitals taken for this visit. Physical Exam    No results found for any visits on 03/26/23.  Assessment & Plan     Problem List Items Addressed This Visit   None   Assessment and Plan              No orders of the defined types were placed in this encounter.    No follow-ups on file.      Jon Eva, MD  [facility] [phone] (phone) [phone] (fax)  [facility]

## 2023-03-26 NOTE — Telephone Encounter (Signed)
 Called and spoke with the patient's mother this morning.  Patient did well throughout the day, acted normally without any new concerns and continues to do well at this time.  Patient's mother has no new concerns at this time but plans to bring her for her follow-up appointment next week (she is unsure if she will bring her today or not since she is doing well).

## 2023-03-31 ENCOUNTER — Encounter: Payer: Self-pay | Admitting: Family Medicine

## 2023-03-31 ENCOUNTER — Ambulatory Visit (INDEPENDENT_AMBULATORY_CARE_PROVIDER_SITE_OTHER): Payer: Medicare Other | Admitting: Family Medicine

## 2023-03-31 VITALS — BP 104/73 | HR 69 | Resp 16 | Ht 68.0 in | Wt 167.0 lb

## 2023-03-31 DIAGNOSIS — B9689 Other specified bacterial agents as the cause of diseases classified elsewhere: Secondary | ICD-10-CM

## 2023-03-31 DIAGNOSIS — B9789 Other viral agents as the cause of diseases classified elsewhere: Secondary | ICD-10-CM | POA: Diagnosis not present

## 2023-03-31 DIAGNOSIS — J019 Acute sinusitis, unspecified: Secondary | ICD-10-CM

## 2023-03-31 NOTE — Progress Notes (Signed)
 Established patient visit   Patient: Joanna Robinson   DOB: 1981-02-26   43 y.o. Female  MRN: [id number] Visit Date: 03/31/2023  Today's healthcare provider: LAURAINE LOISE BUOY, DO   Chief Complaint  Patient presents with   URI   Subjective    HPI The patient, with a history of hypertension, presents with a week-long history of sinus congestion and a two-day history of sore throat. The sinus congestion has been severe enough to affect the eyes, for which the patient sought care from an ophthalmologist and received eye drops. The patient's cough is described as dry and is not productive, nor does it disturb sleep. The patient has been using Mucinex  for the past week with no significant improvement.  The patient has known allergies to corticosteroids and codeine, the latter of which causes stomach upset and nausea.     Medications: Outpatient Medications Prior to Visit  Medication Sig   acetaZOLAMIDE  (DIAMOX ) 250 MG tablet TAKE 1 TABLET BY MOUTH TWICE DAILY   azelastine  (ASTELIN ) 0.1 % nasal spray PLACE 1 SPRAY INTO BOTH NOSTRILS TWICE DAILY.   azelastine  (OPTIVAR ) 0.05 % ophthalmic solution PLACE 1 DROP IN BOTH EYES TWICE DAILY   baclofen  (LIORESAL ) 10 MG tablet TAKE 1 TABLET BY MOUTH TWICE DAILY   benzoyl peroxide  5 % gel Apply topically 2 (two) times daily. To affected area   brompheniramine-pseudoephedrine-DM 30-2-10 MG/5ML syrup Take 5 mLs by mouth 4 (four) times daily as needed.   carbamazepine  (CARBATROL ) 200 MG 12 hr capsule TAKE 1 CAPSULE BY MOUTH ONCE EVERY MORNING AND 1 CAPSULE ONCE EVERY EVENINGAS DIRECTED DOSE DECREASE*   cloNIDine  (CATAPRES ) 0.2 MG tablet TAKE 1 TABLET BY MOUTH AT BEDTIME   ferrous gluconate  (FERGON) 324 MG tablet TAKE 1 TABLET BY MOUTH TWICE DAILY WITH MEALS   fexofenadine  (ALLEGRA ) 180 MG tablet TAKE 1 TABLET BY MOUTH ONCE DAILY   fluticasone  (FLONASE ) 50 MCG/ACT nasal spray Place into both nostrils.   fluvoxaMINE  (LUVOX ) 100 MG tablet TAKE 1  TABLET BY MOUTH TWICE DAILY   GNP MUCUS ER 600 MG 12 hr tablet TAKE 1 TABLET BY MOUTH TWICE DAILY FOR 15 DAYS   hydrochlorothiazide  (HYDRODIURIL ) 25 MG tablet TAKE 1 TABLET BY MOUTH ONCE DAILY   KLOR-CON  20 MEQ packet MIX AND DISSOLVE 1 PACKET IN 4OZ OF LIQUID AND DRINK ONCE DAILY   linaclotide  (LINZESS ) 145 MCG CAPS capsule Take 1 capsule (145 mcg total) by mouth daily.   MAPAP 500 MG tablet TAKE 2 TABLETS BY MOUTH 3 TIMES DAILY ASNEEDED HEADACHE   montelukast  (SINGULAIR ) 10 MG tablet TAKE 1 TABLET BY MOUTH ONCE EVERY EVENING   naproxen  (NAPROSYN ) 500 MG tablet TAKE 1 TABLET BY MOUTH TWICE DAILY   omeprazole  (PRILOSEC) 40 MG capsule TAKE 1 CAPSULE BY MOUTH ONCE DAILY   sodium fluoride  (DENTA 5000 PLUS) 1.1 % CREA dental cream Place 1 application onto teeth every evening. Brush teeth daily for 1 minute, rinse and spit.   sulfacetamide  (BLEPH -10) 10 % ophthalmic solution PLACE 2 DROPS IN RIGHT EYE 4 TIMES DAILY   triamcinolone  cream (KENALOG ) 0.1 % APPLY TO AFFECTED AREA(s) TWICE DAILY ASDIRECTED   No facility-administered medications prior to visit.    Review of Systems  Constitutional:  Negative for appetite change, chills, fatigue and fever.  HENT:  Positive for congestion, postnasal drip, rhinorrhea, sinus pressure and sore throat.   Respiratory:  Positive for cough. Negative for chest tightness and shortness of breath.   Cardiovascular:  Negative  for chest pain and palpitations.  Gastrointestinal:  Negative for abdominal pain, nausea and vomiting.  Neurological:  Negative for dizziness and weakness.        Objective    BP 104/73 (BP Location: Right Arm, Patient Position: Sitting, Cuff Size: Normal)   Pulse 69   Resp 16   Ht 5' 8 (1.727 m)   Wt 167 lb (75.8 kg)   SpO2 98%   BMI 25.39 kg/m     Physical Exam Vitals reviewed.  Constitutional:      General: She is not in acute distress.    Appearance: Normal appearance. She is well-developed. She is not diaphoretic.   HENT:     Head: Normocephalic and atraumatic.     Right Ear: Tympanic membrane, ear canal and external ear normal.     Left Ear: Tympanic membrane, ear canal and external ear normal.     Nose: Congestion and rhinorrhea present.     Mouth/Throat:     Mouth: Mucous membranes are moist.     Pharynx: Oropharynx is clear. Postnasal drip present. No oropharyngeal exudate.  Eyes:     General: No scleral icterus.    Conjunctiva/sclera: Conjunctivae normal.     Pupils: Pupils are equal, round, and reactive to light.  Cardiovascular:     Rate and Rhythm: Normal rate and regular rhythm.     Pulses: Normal pulses.     Heart sounds: Normal heart sounds. No murmur heard. Pulmonary:     Effort: Pulmonary effort is normal. No respiratory distress.     Breath sounds: Normal breath sounds. No wheezing or rales.  Musculoskeletal:     Cervical back: Neck supple.     Right lower leg: No edema.     Left lower leg: No edema.  Lymphadenopathy:     Cervical: No cervical adenopathy.  Skin:    General: Skin is warm and dry.     Findings: No rash.  Neurological:     Mental Status: She is alert.      No results found for any visits on 03/31/23.  Assessment & Plan    Acute viral sinusitis  Sinus congestion for 1.5 weeks, sore throat for 2 days. No fever, vomiting, or abdominal pain. Physical exam: swollen nasal passages, clear throat. Mild symptoms, no antibiotics needed. Discussed nasal flush machines, saline sprays, and humidifiers. Consider antibiotics if no improvement in a week. - Discontinue Mucinex  - Use nasal flush machine or saline spray - Use humidifier for throat irritation - Reevaluate in a week, consider antibiotics if no improvement  Dry, nocturnal cough, non-productive, not disruptive to sleep. No antibiotics indicated today. Discussed albuterol inhaler with spacer if symptoms worsen. - Continue prescribed cough medicine as needed - Consider albuterol inhaler with spacer if cough  worsens  Follow-ups as needed if symptoms worsen or fail to improve      I discussed the assessment and treatment plan with the patient  The patient was provided an opportunity to ask questions and all were answered. The patient agreed with the plan and demonstrated an understanding of the instructions.   The patient was advised to call back or seek an in-person evaluation if the symptoms worsen or if the condition fails to improve as anticipated.  Total time was 30 minutes. That includes chart review before the visit, the actual patient visit, and time spent on documentation after the visit.    LAURAINE LOISE BUOY, DO  [facility] [phone] (phone) [phone] (fax)  [facility]

## 2023-04-02 ENCOUNTER — Ambulatory Visit: Payer: Self-pay | Admitting: *Deleted

## 2023-04-02 ENCOUNTER — Telehealth: Payer: Self-pay | Admitting: Family Medicine

## 2023-04-02 NOTE — Telephone Encounter (Signed)
 Attempted to return call to Sharion Dove (On DPR).   Left a message to call back.

## 2023-04-02 NOTE — Telephone Encounter (Signed)
 Second attempt to call patient mother- no answer- left message to call office

## 2023-04-02 NOTE — Telephone Encounter (Signed)
 Message from Nekoma P sent at 04/02/2023 11:07 AM EST  Summary: congestion  wants antibiotic   Pt's mother called saying she would like Dr. Donzella to call in an antibiotic for her congestion.  She was in last week but it has gotten worse.  CB@  [phone]  [facility] in [place]          Call History  Contact Date/Time Type Contact Phone/Fax By  04/02/2023 11:06 AM EST Phone (Incoming) Vicci Katos (Mother) [phone] [name] [name]

## 2023-04-02 NOTE — Telephone Encounter (Signed)
 Pt's mother is calling in requesting an antibiotic. Pt was seen by Lauraine on Tuesday and was told if she didn't get better to call back and she would send in antibiotic. Pt's mother is requesting that be sent to [facility]. Please follow up with pt's mother when the medication is sent.

## 2023-04-02 NOTE — Telephone Encounter (Signed)
 Third attempt to contact patient's mother- no answer- left message to call office

## 2023-04-03 ENCOUNTER — Other Ambulatory Visit: Payer: Self-pay | Admitting: Family Medicine

## 2023-04-03 ENCOUNTER — Encounter: Payer: Self-pay | Admitting: Family Medicine

## 2023-04-03 ENCOUNTER — Other Ambulatory Visit (INDEPENDENT_AMBULATORY_CARE_PROVIDER_SITE_OTHER): Payer: Self-pay | Admitting: Family

## 2023-04-03 DIAGNOSIS — M62838 Other muscle spasm: Secondary | ICD-10-CM

## 2023-04-03 DIAGNOSIS — G40209 Localization-related (focal) (partial) symptomatic epilepsy and epileptic syndromes with complex partial seizures, not intractable, without status epilepticus: Secondary | ICD-10-CM

## 2023-04-03 MED ORDER — AMOXICILLIN-POT CLAVULANATE 875-125 MG PO TABS: 1.0000 | ORAL_TABLET | Freq: Two times a day (BID) | ORAL | 0 refills | Status: DC

## 2023-04-08 ENCOUNTER — Other Ambulatory Visit: Payer: Self-pay | Admitting: Family Medicine

## 2023-04-08 ENCOUNTER — Other Ambulatory Visit: Payer: Self-pay | Admitting: Physician Assistant

## 2023-04-08 DIAGNOSIS — L709 Acne, unspecified: Secondary | ICD-10-CM

## 2023-05-08 ENCOUNTER — Encounter (INDEPENDENT_AMBULATORY_CARE_PROVIDER_SITE_OTHER): Payer: Self-pay | Admitting: Family

## 2023-05-14 ENCOUNTER — Ambulatory Visit: Payer: Self-pay | Admitting: Family Medicine

## 2023-05-14 ENCOUNTER — Telehealth (INDEPENDENT_AMBULATORY_CARE_PROVIDER_SITE_OTHER): Payer: Self-pay | Admitting: Family

## 2023-05-14 NOTE — Telephone Encounter (Signed)
 Copied from CRM [id number]. Topic: Clinical - Medical Advice >> May 14, 2023 11:00 AM Albin Felling L wrote: Reason for CRM: Pt still has bad cough, still has congestion. Pt given a z pack but symptoms have returned. Denies shortness of breath.   Pt seeking to reschedule cancelled appointment for tomorrow.   Reason for Disposition . Cough with cold symptoms (e.g., runny nose, postnasal drip, throat clearing)  Answer Assessment - Initial Assessment Questions 1. ONSET: "When did the cough begin?"      Few days 2. SEVERITY: "How bad is the cough today?"      severe 3. SPUTUM: "Describe the color of your sputum" (none, dry cough; clear, white, yellow, green)     Non-productive 4. HEMOPTYSIS: "Are you coughing up any blood?" If so ask: "How much?" (flecks, streaks, tablespoons, etc.)     no 5. DIFFICULTY BREATHING: "Are you having difficulty breathing?" If Yes, ask: "How bad is it?" (e.g., mild, moderate, severe)    - MILD: No SOB at rest, mild SOB with walking, speaks normally in sentences, can lie down, no retractions, pulse < 100.    - MODERATE: SOB at rest, SOB with minimal exertion and prefers to sit, cannot lie down flat, speaks in phrases, mild retractions, audible wheezing, pulse 100-120.    - SEVERE: Very SOB at rest, speaks in single words, struggling to breathe, sitting hunched forward, retractions, pulse > 120      no 6. FEVER: "Do you have a fever?" If Yes, ask: "What is your temperature, how was it measured, and when did it start?"     no 7. CARDIAC HISTORY: "Do you have any history of heart disease?" (e.g., heart attack, congestive heart failure)      N/a 8. LUNG HISTORY: "Do you have any history of lung disease?"  (e.g., pulmonary embolus, asthma, emphysema) bronchoitis 9. PE RISK FACTORS: "Do you have a history of blood clots?" (or: recent major surgery, recent prolonged travel, bedridden)     no 10. OTHER SYMPTOMS: "Do you have any other symptoms?" (e.g., runny nose, wheezing, chest  pain)       Runny nose ,  11. PREGNANCY: "Is there any chance you are pregnant?" "When was your last menstrual period?"       N/a 12. TRAVEL: "Have you traveled out of the country in the last month?" (e.g., travel history, exposures)       N/a  Protocols used: Cough - Acute Non-Productive-A-AH

## 2023-05-14 NOTE — Telephone Encounter (Signed)
 Appt made with mother for 05/18/23 at 4:00 p.m.

## 2023-05-14 NOTE — Telephone Encounter (Signed)
 I called Mom and rescheduled the appointment. TG

## 2023-05-14 NOTE — Telephone Encounter (Signed)
 Mom called stating that she cannot bring patient into appointment early in the morning. She says because of pts condition and how she moves it would have to be later on in the day. She is wanting to speak with Inetta Fermo regarding this. Would like call back [phone].

## 2023-05-14 NOTE — Telephone Encounter (Signed)
  Chief Complaint: cough Symptoms: moderate to severe cough - nonproductive, runny nose, congestion Frequency: few days Pertinent Negatives: Patient denies fever, SOB Disposition: [] ED /[] Urgent Care (no appt availability in office) / [] Appointment(In office/virtual)/ []  [place] [facility]/ [] Home Care/ [] Refused Recommended Disposition /[] [place] [facility]/ [x]  Follow-up with PCP Additional Notes: Mom stated given pt muccinex, prescription cough medication. Pt was given z- pack & completed it: pt started felling better now symptoms have returned. Mom stated she had to cancel appt due to bad weather in her area therefore unable to drive.  Mom attempting to reschedule for 1st of the week: PCP has no openings: would like to be worked into PCP schedule if possible & needs later appt time (around 1 or 2) due to not being able to get pt there earlier due to condition.

## 2023-05-14 NOTE — Telephone Encounter (Signed)
 Called patient to triage & determine appointment needs: someone answered, I asked for patient & then call disconnected.  Will attempt to reach back out to patient.

## 2023-05-15 ENCOUNTER — Ambulatory Visit: Payer: Medicare Other | Admitting: Family Medicine

## 2023-05-18 ENCOUNTER — Ambulatory Visit (INDEPENDENT_AMBULATORY_CARE_PROVIDER_SITE_OTHER): Payer: Medicare Other | Admitting: Family Medicine

## 2023-05-18 ENCOUNTER — Ambulatory Visit (INDEPENDENT_AMBULATORY_CARE_PROVIDER_SITE_OTHER): Payer: Self-pay | Admitting: Family

## 2023-05-18 VITALS — BP 121/88 | HR 98 | Temp 98.2°F | Resp 20 | Ht 68.0 in | Wt 174.0 lb

## 2023-05-18 DIAGNOSIS — Z79899 Other long term (current) drug therapy: Secondary | ICD-10-CM

## 2023-05-18 DIAGNOSIS — J069 Acute upper respiratory infection, unspecified: Secondary | ICD-10-CM

## 2023-05-18 DIAGNOSIS — E7800 Pure hypercholesterolemia, unspecified: Secondary | ICD-10-CM | POA: Diagnosis not present

## 2023-05-18 DIAGNOSIS — J3489 Other specified disorders of nose and nasal sinuses: Secondary | ICD-10-CM

## 2023-05-18 DIAGNOSIS — J301 Allergic rhinitis due to pollen: Secondary | ICD-10-CM

## 2023-05-18 DIAGNOSIS — G40209 Localization-related (focal) (partial) symptomatic epilepsy and epileptic syndromes with complex partial seizures, not intractable, without status epilepticus: Secondary | ICD-10-CM | POA: Diagnosis not present

## 2023-05-18 DIAGNOSIS — R059 Cough, unspecified: Secondary | ICD-10-CM

## 2023-05-18 DIAGNOSIS — D709 Neutropenia, unspecified: Secondary | ICD-10-CM

## 2023-05-18 DIAGNOSIS — R7889 Finding of other specified substances, not normally found in blood: Secondary | ICD-10-CM | POA: Diagnosis not present

## 2023-05-18 MED ORDER — PSEUDOEPH-BROMPHEN-DM 30-2-10 MG/5ML PO SYRP: 5.0000 mL | ORAL_SOLUTION | Freq: Four times a day (QID) | ORAL | 1 refills | Status: DC | PRN

## 2023-05-18 MED ORDER — AYR SALINE NASAL NA GEL: 1.0000 | NASAL | 1 refills | Status: AC | PRN

## 2023-05-18 MED ORDER — CETIRIZINE HCL 10 MG PO TABS: 10.0000 mg | ORAL_TABLET | Freq: Every day | ORAL | 3 refills | Status: AC

## 2023-05-18 NOTE — Progress Notes (Unsigned)
 Established patient visit   Patient: Joanna Robinson   DOB: 03/14/1981   43 y.o. Female  MRN: [id number] Visit Date: 05/18/2023  Today's healthcare provider: Sherlyn Hay, DO   Chief Complaint  Patient presents with  . Cough    Started last Wednesday  . Nasal Congestion    Complaining of nasal pain with congestion  . Facial Pain   Subjective    HPI    ***  {History (Optional):23778}  Medications: Outpatient Medications Prior to Visit  Medication Sig  . acetaZOLAMIDE (DIAMOX) 250 MG tablet TAKE 1 TABLET BY MOUTH TWICE DAILY  . azelastine (ASTELIN) 0.1 % nasal spray PLACE 1 SPRAY INTO BOTH NOSTRILS TWICE DAILY.  Marland Kitchen azelastine (OPTIVAR) 0.05 % ophthalmic solution PLACE 1 DROP IN BOTH EYES TWICE DAILY  . baclofen (LIORESAL) 10 MG tablet TAKE 1 TABLET BY MOUTH TWICE DAILY  . benzoyl peroxide 5 % gel Apply topically 2 (two) times daily. To affected area  . benzoyl peroxide-erythromycin (BENZAMYCIN) gel APPLY TO AFFECTED AREA(s) TWICE DAILY ASDIRECTED  . brompheniramine-pseudoephedrine-DM 30-2-10 MG/5ML syrup Take 5 mLs by mouth 4 (four) times daily as needed.  . carbamazepine (CARBATROL) 200 MG 12 hr capsule TAKE 1 CAPSULE BY MOUTH ONCE EVERY MORNING AND 1 CAPSULE ONCE EVERY EVENINGAS DIRECTED DOSE DECREASE*  . cloNIDine (CATAPRES) 0.2 MG tablet TAKE 1 TABLET BY MOUTH AT BEDTIME  . ferrous gluconate (FERGON) 324 MG tablet TAKE 1 TABLET BY MOUTH TWICE DAILY WITH MEALS  . fexofenadine (ALLEGRA) 180 MG tablet TAKE 1 TABLET BY MOUTH ONCE DAILY  . fluticasone (FLONASE) 50 MCG/ACT nasal spray Place into both nostrils.  . fluvoxaMINE (LUVOX) 100 MG tablet TAKE 1 TABLET BY MOUTH TWICE DAILY  . GNP MUCUS ER 600 MG 12 hr tablet TAKE 1 TABLET BY MOUTH TWICE DAILY FOR 15 DAYS  . hydrochlorothiazide (HYDRODIURIL) 25 MG tablet TAKE 1 TABLET BY MOUTH ONCE DAILY  . linaclotide (LINZESS) 145 MCG CAPS capsule Take 1 capsule (145 mcg total) by mouth daily.  Marland Kitchen MAPAP 500 MG tablet TAKE 2  TABLETS BY MOUTH 3 TIMES DAILY ASNEEDED HEADACHE  . montelukast (SINGULAIR) 10 MG tablet TAKE 1 TABLET BY MOUTH ONCE EVERY EVENING  . naproxen (NAPROSYN) 500 MG tablet TAKE 1 TABLET BY MOUTH TWICE DAILY  . omeprazole (PRILOSEC) 40 MG capsule TAKE 1 CAPSULE BY MOUTH ONCE DAILY  . potassium chloride (KLOR-CON) 20 MEQ packet MIX AND DISSOLVE 1 PACKET IN 4OZ OF LIQUID AND DRINK ONCE DAILY  . sodium fluoride (DENTA 5000 PLUS) 1.1 % CREA dental cream Place 1 application onto teeth every evening. Brush teeth daily for 1 minute, rinse and spit.  Marland Kitchen sulfacetamide (BLEPH-10) 10 % ophthalmic solution PLACE 2 DROPS IN RIGHT EYE 4 TIMES DAILY  . triamcinolone cream (KENALOG) 0.1 % APPLY TO AFFECTED AREA(s) TWICE DAILY ASDIRECTED  . [DISCONTINUED] amoxicillin-clavulanate (AUGMENTIN) 875-125 MG tablet Take 1 tablet by mouth 2 (two) times daily.   No facility-administered medications prior to visit.    Review of Systems ***  {Insert previous labs (optional):23779} {See past labs  Heme  Chem  Endocrine  Serology  Results Review (optional):1}   Objective    BP 121/88   Pulse 98   Temp 98.2 F (36.8 C)   Resp 20   Ht 5\' 8"  (1.727 m)   Wt 174 lb (78.9 kg)   SpO2 100%   BMI 26.46 kg/m  {Insert last BP/Wt (optional):23777}{See vitals history (optional):1}   Physical Exam  No results found for any visits on 05/18/23.  Assessment & Plan    Neutropenia, unspecified type (HCC) -     CBC with Differential/Platelet  Elevated carbamazepine level -     Carbamazepine level, total  High risk medication use -     Carbamazepine level, total  Pure hypercholesterolemia -     Comprehensive metabolic panel -     Lipid panel  Partial epilepsy with impairment of consciousness (HCC) -     Comprehensive metabolic panel    ***  No follow-ups on file.      I discussed the assessment and treatment plan with the patient  The patient was provided an opportunity to ask questions and all were  answered. The patient agreed with the plan and demonstrated an understanding of the instructions.   The patient was advised to call back or seek an in-person evaluation if the symptoms worsen or if the condition fails to improve as anticipated.    Sherlyn Hay, DO  [facility] [phone] (phone) [phone] (fax)  [facility]

## 2023-05-18 NOTE — Progress Notes (Unsigned)
 Duplicate

## 2023-05-19 ENCOUNTER — Encounter: Payer: Self-pay | Admitting: Family Medicine

## 2023-05-19 LAB — CBC WITH DIFFERENTIAL/PLATELET
Basophils Absolute: 0 10*3/uL (ref 0.0–0.2)
Basos: 0 %
EOS (ABSOLUTE): 0.1 10*3/uL (ref 0.0–0.4)
Eos: 2 %
Hematocrit: 38.7 % (ref 34.0–46.6)
Hemoglobin: 12.4 g/dL (ref 11.1–15.9)
Immature Grans (Abs): 0 10*3/uL (ref 0.0–0.1)
Immature Granulocytes: 0 %
Lymphocytes Absolute: 1.2 10*3/uL (ref 0.7–3.1)
Lymphs: 34 %
MCH: 29.8 pg (ref 26.6–33.0)
MCHC: 32 g/dL (ref 31.5–35.7)
MCV: 93 fL (ref 79–97)
Monocytes Absolute: 0.4 10*3/uL (ref 0.1–0.9)
Monocytes: 11 %
Neutrophils Absolute: 1.9 10*3/uL (ref 1.4–7.0)
Neutrophils: 53 %
Platelets: 207 10*3/uL (ref 150–450)
RBC: 4.16 x10E6/uL (ref 3.77–5.28)
RDW: 11.8 % (ref 11.7–15.4)
WBC: 3.6 10*3/uL (ref 3.4–10.8)

## 2023-05-19 LAB — COMPREHENSIVE METABOLIC PANEL
ALT: 9 IU/L (ref 0–32)
AST: 12 IU/L (ref 0–40)
Albumin: 4.1 g/dL (ref 3.9–4.9)
Alkaline Phosphatase: 86 IU/L (ref 44–121)
BUN/Creatinine Ratio: 20 (ref 9–23)
BUN: 13 mg/dL (ref 6–24)
Bilirubin Total: <0.2 mg/dL (ref 0.0–1.2)
CO2: 20 mmol/L (ref 20–29)
Calcium: 8.7 mg/dL (ref 8.7–10.2)
Chloride: 106 mmol/L (ref 96–106)
Creatinine, Ser: 0.65 mg/dL (ref 0.57–1.00)
Globulin, Total: 2.9 g/dL (ref 1.5–4.5)
Glucose: 92 mg/dL (ref 70–99)
Potassium: 3.6 mmol/L (ref 3.5–5.2)
Sodium: 138 mmol/L (ref 134–144)
Total Protein: 7 g/dL (ref 6.0–8.5)
eGFR: 114 mL/min/{1.73_m2} (ref >59)

## 2023-05-19 LAB — LIPID PANEL
Chol/HDL Ratio: 2.5 ratio (ref 0.0–4.4)
Cholesterol, Total: 219 mg/dL — ABNORMAL HIGH (ref 100–199)
HDL: 86 mg/dL (ref >39)
LDL Chol Calc (NIH): 127 mg/dL — ABNORMAL HIGH (ref 0–99)
Triglycerides: 34 mg/dL (ref 0–149)
VLDL Cholesterol Cal: 6 mg/dL (ref 5–40)

## 2023-05-19 LAB — CARBAMAZEPINE LEVEL, TOTAL: Carbamazepine (Tegretol), S: 11.5 ug/mL (ref 4.0–12.0)

## 2023-05-19 NOTE — Assessment & Plan Note (Signed)
 Chronic condition managed with azelastine and montelukast. Current symptoms suggest possible exacerbation. Patient has been using Allegra, which may not be effective. Discussed switching to Zyrtec for better symptom control. Claritin deemed less effective. - Switch from Allegra to Zyrtec - Continue azelastine nasal spray daily - Monitor for symptom improvement with new medication regimen

## 2023-05-19 NOTE — Assessment & Plan Note (Signed)
 Acute onset of cough and nasal congestion for four days. No fever. Nasal congestion described as painful and cold. Physical exam reveals red and swollen nasal passages. No wheezing or crackles on lung auscultation. Likely viral etiology; recent antibiotic use noted. Discussed that antibiotics are not recommended at this point. Advised to monitor symptoms and consider antibiotics if no improvement by Thursday. - Switch to Zyrtec for allergy management - Continue azelastine nasal spray daily - Continue cough medicine as needed - Continue Mucinex for one more week - Consider albuterol inhaler if symptoms persist - Reassess by Thursday; consider antibiotics if no improvement - Send nasal gel for moisturizing if dryness is suspected - Advise to stop using humidifier at night to see if symptoms improve

## 2023-05-20 ENCOUNTER — Encounter: Payer: Self-pay | Admitting: Family Medicine

## 2023-05-20 ENCOUNTER — Telehealth: Payer: Self-pay | Admitting: Family Medicine

## 2023-05-20 NOTE — Telephone Encounter (Signed)
 [facility] is requesting prior authorization Key: [id number] Pseudoeph- Bromphen-DM 30-2-10MG /5ML Syrup

## 2023-05-21 ENCOUNTER — Telehealth: Payer: Self-pay | Admitting: Pharmacy Technician

## 2023-05-21 ENCOUNTER — Other Ambulatory Visit (HOSPITAL_COMMUNITY): Payer: Self-pay

## 2023-05-21 NOTE — Telephone Encounter (Signed)
 Pharmacy Patient Advocate Encounter   Received notification from Pt Calls Messages that prior authorization for PSEUDOEPHEDRINE/BROMPHENIRAMINE DM 30-2-10 MG/5ML SYRUP is required/requested.   Insurance verification completed.   The patient is insured through [facility] .   Per test claim:   Cough medications are excluded from coverage under Medicare Part D. No PA can be obtained for coverage.

## 2023-05-21 NOTE — Telephone Encounter (Signed)
 PA request has been  RECEIVED . New Encounter will be created for follow up. For additional info see Pharmacy Prior Auth telephone encounter from 05/21/2023.

## 2023-06-01 ENCOUNTER — Other Ambulatory Visit: Payer: Self-pay | Admitting: Physician Assistant

## 2023-06-01 ENCOUNTER — Other Ambulatory Visit: Payer: Self-pay | Admitting: Family Medicine

## 2023-06-01 DIAGNOSIS — M62838 Other muscle spasm: Secondary | ICD-10-CM

## 2023-06-02 ENCOUNTER — Encounter (INDEPENDENT_AMBULATORY_CARE_PROVIDER_SITE_OTHER): Payer: Self-pay | Admitting: Family

## 2023-06-02 ENCOUNTER — Ambulatory Visit (INDEPENDENT_AMBULATORY_CARE_PROVIDER_SITE_OTHER): Payer: Self-pay | Admitting: Family

## 2023-06-02 ENCOUNTER — Telehealth: Payer: Self-pay

## 2023-06-02 VITALS — BP 96/64 | HR 80 | Wt 165.0 lb

## 2023-06-02 DIAGNOSIS — F840 Autistic disorder: Secondary | ICD-10-CM | POA: Diagnosis not present

## 2023-06-02 DIAGNOSIS — G40209 Localization-related (focal) (partial) symptomatic epilepsy and epileptic syndromes with complex partial seizures, not intractable, without status epilepticus: Secondary | ICD-10-CM

## 2023-06-02 DIAGNOSIS — F79 Unspecified intellectual disabilities: Secondary | ICD-10-CM

## 2023-06-02 DIAGNOSIS — J329 Chronic sinusitis, unspecified: Secondary | ICD-10-CM

## 2023-06-02 MED ORDER — AZITHROMYCIN 250 MG PO TABS: ORAL_TABLET | ORAL | 0 refills | Status: AC

## 2023-06-02 NOTE — Telephone Encounter (Signed)
 Requested Prescriptions  Pending Prescriptions Disp Refills   baclofen (LIORESAL) 10 MG tablet [Pharmacy Med Name: BACLOFEN 10 MG TAB] 60 each 1    Sig: TAKE 1 TABLET BY MOUTH TWICE DAILY     Analgesics:  Muscle Relaxants - baclofen Passed - 06/02/2023  3:28 PM      Passed - Cr in normal range and within 180 days    Creatinine, Ser  Date Value Ref Range Status  05/18/2023 0.65 0.57 - 1.00 mg/dL Final         Passed - eGFR is 30 or above and within 180 days    GFR calc Af Amer  Date Value Ref Range Status  05/16/2020 124 >59 mL/min/1.73 Final    Comment:    **In accordance with recommendations from the NKF-ASN Task force,**   [facility] is in the process of updating its eGFR calculation to the   2021 CKD-EPI creatinine equation that estimates kidney function   without a race variable.    GFR calc non Af Amer  Date Value Ref Range Status  05/16/2020 107 >59 mL/min/1.73 Final   eGFR  Date Value Ref Range Status  05/18/2023 114 >59 mL/min/1.73 Final         Passed - Valid encounter within last 6 months    Recent Outpatient Visits           2 months ago Acute viral sinusitis   [facility] [place], Monico Blitz, DO   6 months ago Partial epilepsy with impairment of consciousness [facility])   [facility] [place], Monico Blitz, DO   9 months ago Subconjunctival hemorrhage of right eye   [facility] [name], PA-C   1 year ago Preop general physical exam   [facility] [name], PA-C   1 year ago Annual physical exam   [facility] [name], [place]

## 2023-06-02 NOTE — Patient Instructions (Signed)
 It was a pleasure to see you today!  Instructions for you until your next appointment are as follows: Continue Haydon's medications as prescribed Let me know if you have any questions or concerns Please sign up for MyChart if you have not done so. Please plan to return for follow up in 1 year or sooner if needed.  Feel free to contact our office during normal business hours at [phone] with questions or concerns. If there is no answer or the call is outside business hours, please leave a message and our clinic staff will call you back within the next business day.  If you have an urgent concern, please stay on the line for our after-hours answering service and ask for the on-call neurologist.     I also encourage you to use MyChart to communicate with me more directly. If you have not yet signed up for MyChart within [facility], the front desk staff can help you. However, please note that this inbox is NOT monitored on nights or weekends, and response can take up to 2 business days.  Urgent matters should be discussed with the on-call pediatric neurologist.   At [facility], we are committed to providing exceptional care. You will receive a patient satisfaction survey through text or email regarding your visit today. Your opinion is important to me. Comments are appreciated.

## 2023-06-02 NOTE — Telephone Encounter (Unsigned)
 Copied from CRM [id number]. Topic: Clinical - Medical Advice >> Jun 02, 2023 12:01 PM Geroge Baseman wrote: Reason for CRM: patient is still sick, she is getting worse. They would like to  have the antibiotic called in for her at this time. Also inquiring if the patient was ever able to get the referral in to the ENT. Please call to advise.

## 2023-06-02 NOTE — Progress Notes (Signed)
 Joanna Robinson   MRN:  [id number]  1980-05-29   Provider: Elveria Rising NP-C Location of Care: [facility]  Visit type: Return visit  Last visit: 09/18/2022  Referral source: Pardue, Sarah N, DO History from: Epic chart and patient's mother  Brief history:  Copied from previous record: History of undifferentiated autism and complex partial seizures, as well as tic-like abnormal movements.She is taking and tolerating Carbatrol and Diamox for her seizure disorder. She is very self directed and moves at a very slow pace. She is unable to be left alone and requires constant supervision and care.   Today's concerns: Mom reports that Joanna Robinson has remained largely seizure except for an occasional very brief seizure that occurs with her menstrual cycle that involve behavioral arrest lasting less than a minute Mom reports that [facility] frequently complains of her nose hurting. She says that she has been evaluated by her PCP and an ENT with no etiology found. Mom wonders if she needs further evaluation She is cared for at home by her mother, who manages her behavior well Joanna Robinson has been otherwise generally healthy since she was last seen. No health concerns today other than previously mentioned.  Review of systems: Please see HPI for neurologic and other pertinent review of systems. Otherwise all other systems were reviewed and were negative.  Problem List: Patient Active Problem List   Diagnosis Date Noted   Upper respiratory infection, viral 05/18/2023   Acute viral sinusitis 03/31/2023   Dark stools 07/13/2019   Abnormal blood chemistry 12/20/2014   Abscess of axilla 12/20/2014   Dry eye syndrome 12/20/2014   Dermoid inclusion cyst 12/20/2014   Hematoma 12/20/2014   HLD (hyperlipidemia) 12/20/2014   Decreased potassium in the blood 12/20/2014   Irregular bleeding 12/20/2014   Leukopenia 12/20/2014   Malaise and fatigue 12/20/2014    Headache 12/18/2014   Acne 10/30/2014   Constipation 09/18/2014   Neutropenia (HCC) 09/18/2014   Elevated carbamazepine level 01/25/2014   Partial epilepsy with impairment of consciousness (HCC) 01/10/2013   Active autistic disorder 01/10/2013   Long-term use of high-risk medication 01/10/2013   Dysmenorrhea 07/10/2008   Mechanical and motor problems with internal organs 11/08/2007   Allergic rhinitis 08/20/2007   External hemorrhoids without complication 11/13/2006   Edema 12/26/2003   Epilepsy, not refractory (HCC) 12/26/2003   Intellectual disability 12/26/2003     Past Medical History:  Diagnosis Date   Autism    Epilepsy (HCC)    Hemorrhoid    Hyperlipidemia    Seizures (HCC)     Past medical history comments: See HPI  Surgical history: Past Surgical History:  Procedure Laterality Date   NO PAST SURGERIES      Family history: family history includes Alzheimer's disease in her maternal grandmother and paternal grandfather.   Social history: Social History   Socioeconomic History   Marital status: Single    Spouse name: Not on file   Number of children: Not on file   Years of education: Not on file   Highest education level: Not on file  Occupational History   Not on file  Tobacco Use   Smoking status: Never   Smokeless tobacco: Never  Vaping Use   Vaping status: Never Used  Substance and Sexual Activity   Alcohol use: No   Drug use: No   Sexual activity: Never  Other Topics Concern   Not on file  Social History Narrative   Joanna Robinson is a high  school graduate.    She has a nurse that comes to her home and they go out on field trips. (2 days a week)    She enjoys exercise, dance, and riding bike. She lives with her mother.   Social Drivers of Corporate investment banker Strain: Low Risk  (10/20/2022)   Overall Financial Resource Strain (CARDIA)    Difficulty of Paying Living Expenses: Not hard at all  Food Insecurity: No Food Insecurity  (10/20/2022)   Hunger Vital Sign    Worried About Running Out of Food in the Last Year: Never true    Ran Out of Food in the Last Year: Never true  Transportation Needs: No Transportation Needs (10/20/2022)   PRAPARE - Administrator, Civil Service (Medical): No    Lack of Transportation (Non-Medical): No  Physical Activity: Insufficiently Active (10/20/2022)   Exercise Vital Sign    Days of Exercise per Week: 2 days    Minutes of Exercise per Session: 30 min  Stress: No Stress Concern Present (10/20/2022)   [employer] of Occupational Health - Occupational Stress Questionnaire    Feeling of Stress : Not at all  Social Connections: Moderately Isolated (10/20/2022)   Social Connection and Isolation Panel [NHANES]    Frequency of Communication with Friends and Family: Once a week    Frequency of Social Gatherings with Friends and Family: More than three times a week    Attends Religious Services: 1 to 4 times per year    Active Member of [employer] or Organizations: No    Attends Banker Meetings: Never    Marital Status: Never married  Intimate Partner Violence: Not At Risk (10/20/2022)   Humiliation, Afraid, Rape, and Kick questionnaire    Fear of Current or Ex-Partner: No    Emotionally Abused: No    Physically Abused: No    Sexually Abused: No    Past/failed meds: Copied from previous record: Carbamazepine IR - side effects   Allergies: Allergies  Allergen Reactions   Codeine Nausea Only   Corticosteroids Nausea Only    Immunizations: Immunization History  Administered Date(s) Administered   Influenza,inj,Quad PF,6+ Mos 01/07/2013, 01/05/2018, 02/07/2019, 12/30/2019, 01/31/2021   PFIZER(Purple Top)SARS-COV-2 Vaccination 08/16/2019, 09/06/2019, 03/09/2020   Td 12/20/2004, 10/23/2021   Tdap 08/16/2010    Diagnostics/Screenings:  Physical Exam: BP 96/64 (BP Location: Right Arm, Patient Position: Sitting, Cuff Size: Normal)   Pulse 80   Wt 165 lb  (74.8 kg) Comment: Per mom Last weight was last week.  LMP 06/01/2023 (Exact Date)   BMI 25.09 kg/m   Wt Readings from Last 3 Encounters:  06/02/23 165 lb (74.8 kg)  05/18/23 174 lb (78.9 kg)  03/31/23 167 lb (75.8 kg)  General: Well developed, well nourished woman, seated in wheelchair, in no evident distress Head: Head normocephalic and atraumatic.   Neck: Supple Cardiovascular: Regular rate and rhythm, no murmurs Respiratory: Breath sounds clear to auscultation Musculoskeletal: No obvious deformities or scoliosis Skin: No rashes or neurocutaneous lesions  Neurologic Exam Mental Status: Awake and fully alert.  Occasionally speaks a random word. Able to follow only a very few basic commands Cranial Nerves: Fundoscopic exam reveals sharp disc margins.  Turns to localize faces, objects and sounds in the periphery Face tongue, palate move normally and symmetrically. Shoulder shrug normal Motor: Normal functional bulk, tone and strength Sensory: Withdrawal x 4 Coordination: No dysmetria with reach for objects Gait and Station: I did not get her out of her  wheelchair today  Impression: Partial epilepsy with impairment of consciousness (HCC)  Active autistic disorder  Intellectual disability   Recommendations for plan of care: The patient's previous Epic records were reviewed. No recent diagnostic studies to be reviewed with the patient.  Plan until next visit: Continue medications as prescribed  Call for questions or concerns Return in about 1 year (around 06/01/2024).  The medication list was reviewed and reconciled. No changes were made in the prescribed medications today. A complete medication list was provided to the patient.  Allergies as of 06/02/2023       Reactions   Codeine Nausea Only   Corticosteroids Nausea Only        Medication List        Accurate as of June 02, 2023  3:15 PM. If you have any questions, ask your nurse or doctor.           acetaZOLAMIDE 250 MG tablet Commonly known as: DIAMOX TAKE 1 TABLET BY MOUTH TWICE DAILY   azelastine 0.05 % ophthalmic solution Commonly known as: OPTIVAR PLACE 1 DROP IN BOTH EYES TWICE DAILY   azelastine 0.1 % nasal spray Commonly known as: ASTELIN PLACE 1 SPRAY INTO BOTH NOSTRILS TWICE DAILY.   baclofen 10 MG tablet Commonly known as: LIORESAL TAKE 1 TABLET BY MOUTH TWICE DAILY   benzoyl peroxide 5 % gel Apply topically 2 (two) times daily. To affected area   benzoyl peroxide-erythromycin gel Commonly known as: BENZAMYCIN APPLY TO AFFECTED AREA(s) TWICE DAILY ASDIRECTED   brompheniramine-pseudoephedrine-DM 30-2-10 MG/5ML syrup Take 5 mLs by mouth 4 (four) times daily as needed.   carbamazepine 200 MG 12 hr capsule Commonly known as: CARBATROL TAKE 1 CAPSULE BY MOUTH ONCE EVERY MORNING AND 1 CAPSULE ONCE EVERY EVENINGAS DIRECTED DOSE DECREASE*   cetirizine 10 MG tablet Commonly known as: ZYRTEC Take 1 tablet (10 mg total) by mouth daily.   cloNIDine 0.2 MG tablet Commonly known as: CATAPRES TAKE 1 TABLET BY MOUTH AT BEDTIME   ferrous gluconate 324 MG tablet Commonly known as: FERGON TAKE 1 TABLET BY MOUTH TWICE DAILY WITH MEALS   fluticasone 50 MCG/ACT nasal spray Commonly known as: FLONASE Place into both nostrils.   fluvoxaMINE 100 MG tablet Commonly known as: LUVOX TAKE 1 TABLET BY MOUTH TWICE DAILY   hydrochlorothiazide 25 MG tablet Commonly known as: HYDRODIURIL TAKE 1 TABLET BY MOUTH ONCE DAILY   Klor-Con 20 MEQ packet Generic drug: potassium chloride MIX AND DISSOLVE 1 PACKET IN 4OZ OF LIQUID AND DRINK ONCE DAILY   Linzess 145 MCG Caps capsule Generic drug: linaclotide TAKE 1 CAPSULE BY MOUTH ONCE DAILY   Mapap 500 MG tablet Generic drug: acetaminophen TAKE 2 TABLETS BY MOUTH 3 TIMES DAILY ASNEEDED HEADACHE   montelukast 10 MG tablet Commonly known as: SINGULAIR TAKE 1 TABLET BY MOUTH ONCE EVERY EVENING   naproxen 500 MG  tablet Commonly known as: NAPROSYN TAKE 1 TABLET BY MOUTH TWICE DAILY   omeprazole 40 MG capsule Commonly known as: PRILOSEC TAKE 1 CAPSULE BY MOUTH ONCE DAILY   saline Gel Place 1 Application into both nostrils every 4 (four) hours as needed.   sodium fluoride 1.1 % Crea dental cream Commonly known as: Denta 5000 Plus Place 1 application onto teeth every evening. Brush teeth daily for 1 minute, rinse and spit.   sulfacetamide 10 % ophthalmic solution Commonly known as: BLEPH-10 PLACE 2 DROPS IN RIGHT EYE 4 TIMES DAILY   triamcinolone cream 0.1 % Commonly known as: KENALOG APPLY  TO AFFECTED AREA(s) TWICE DAILY ASDIRECTED      Total time spent with the patient was 20 minutes, of which 50% or more was spent in counseling and coordination of care.  Elveria Rising NP-C [facility] [address], [address] [place], [place] [address] Ph. [phone] Fax [phone]

## 2023-06-03 NOTE — Telephone Encounter (Signed)
 Pt mother advised. Verbalized understanding

## 2023-06-24 DIAGNOSIS — J310 Chronic rhinitis: Secondary | ICD-10-CM | POA: Diagnosis not present

## 2023-06-25 ENCOUNTER — Ambulatory Visit: Payer: Self-pay

## 2023-06-25 NOTE — Telephone Encounter (Signed)
 1st attempt. Voicemail left to return call for triage.   Copied from CRM [id number]. Topic: Clinical - Medication Refill >> Jun 25, 2023  3:41 PM Shon Hale wrote: Most Recent Primary Care Visit:  Provider: Sherlyn Hay  Department: [facility]  Visit Type: OFFICE VISIT  Date: 05/18/2023  Medication: Pt had visit w/ hairdresser today. Informed to reach out to PCP and request Derma-Smoothe/FS Scalp Oil.   Has the patient contacted their pharmacy? No  Is this the correct pharmacy for this prescription? Yes This is the patient's preferred pharmacy:  [facility] - [place], [place] - [address] [place] [address] Phone: [phone] Fax: [phone]   Has the prescription been filled recently? No  Is the patient out of the medication?   Has the patient been seen for an appointment in the last year OR does the patient have an upcoming appointment? Yes  Can we respond through MyChart? No  Agent: Please be advised that Rx refills may take up to 3 business days. We ask that you follow-up with your pharmacy.

## 2023-06-25 NOTE — Telephone Encounter (Signed)
 2nd attempt, Pt's mother called, left VM to return the call to the office to speak to NT.   Copied from CRM [id number]. Topic: Clinical - Medication Refill >> Jun 25, 2023  3:41 PM Shon Hale wrote: Most Recent Primary Care Visit:  Provider: Sherlyn Hay  Department: [facility]  Visit Type: OFFICE VISIT  Date: 05/18/2023   Medication: Pt had visit w/ hairdresser today. Informed to reach out to PCP and request Derma-Smoothe/FS Scalp Oil.    Has the patient contacted their pharmacy? No   Is this the correct pharmacy for this prescription? Yes This is the patient's preferred pharmacy:  [facility] - [place], [place] - [address] [place] [address] Phone: [phone] Fax: [phone]     Has the prescription been filled recently? No   Is the patient out of the medication?    Has the patient been seen for an appointment in the last year OR does the patient have an upcoming appointment? Yes   Can we respond through MyChart? No   Agent: Please be advised that Rx refills may take up to 3 business days. We ask that you follow-up with your pharmacy.

## 2023-06-25 NOTE — Telephone Encounter (Signed)
 3rd attempt to contact patient or mother unsuccessful. Routing for follow up.  Reason for Disposition . Third attempt to contact caller AND no contact made. Phone number verified.  Protocols used: No Contact or Duplicate Contact Call-A-AH

## 2023-06-29 NOTE — Telephone Encounter (Signed)
 Verbal order given to pharmacy for 1 box 5 refills

## 2023-07-01 ENCOUNTER — Other Ambulatory Visit (INDEPENDENT_AMBULATORY_CARE_PROVIDER_SITE_OTHER): Payer: Self-pay | Admitting: Family

## 2023-07-01 ENCOUNTER — Other Ambulatory Visit: Payer: Self-pay | Admitting: Physician Assistant

## 2023-07-01 ENCOUNTER — Other Ambulatory Visit: Payer: Self-pay | Admitting: Family Medicine

## 2023-07-01 DIAGNOSIS — G479 Sleep disorder, unspecified: Secondary | ICD-10-CM

## 2023-07-01 DIAGNOSIS — R609 Edema, unspecified: Secondary | ICD-10-CM

## 2023-07-01 DIAGNOSIS — N946 Dysmenorrhea, unspecified: Secondary | ICD-10-CM

## 2023-07-01 DIAGNOSIS — G40209 Localization-related (focal) (partial) symptomatic epilepsy and epileptic syndromes with complex partial seizures, not intractable, without status epilepticus: Secondary | ICD-10-CM

## 2023-07-02 ENCOUNTER — Ambulatory Visit: Payer: Self-pay

## 2023-07-02 NOTE — Telephone Encounter (Signed)
 Requested Prescriptions  Pending Prescriptions Disp Refills   fluvoxaMINE (LUVOX) 100 MG tablet [Pharmacy Med Name: FLUVOXAMINE MALEATE 100 MG TAB] 180 tablet 0    Sig: TAKE 1 TABLET BY MOUTH TWICE DAILY     Psychiatry:  Antidepressants - SSRI Passed - 07/02/2023 11:30 AM      Passed - Valid encounter within last 6 months    Recent Outpatient Visits           1 month ago Upper respiratory infection, viral   [place] [facility] Pardue, Sarah N, DO               naproxen (NAPROSYN) 500 MG tablet [Pharmacy Med Name: NAPROXEN 500 MG TAB] 180 tablet 0    Sig: TAKE 1 TABLET BY MOUTH TWICE DAILY     Analgesics:  NSAIDS Failed - 07/02/2023 11:30 AM      Failed - Manual Review: Labs are only required if the patient has taken medication for more than 8 weeks.      Passed - Cr in normal range and within 360 days    Creatinine, Ser  Date Value Ref Range Status  05/18/2023 0.65 0.57 - 1.00 mg/dL Final         Passed - HGB in normal range and within 360 days    Hemoglobin  Date Value Ref Range Status  05/18/2023 12.4 11.1 - 15.9 g/dL Final         Passed - PLT in normal range and within 360 days    Platelets  Date Value Ref Range Status  05/18/2023 207 150 - 450 x10E3/uL Final         Passed - HCT in normal range and within 360 days    Hematocrit  Date Value Ref Range Status  05/18/2023 38.7 34.0 - 46.6 % Final         Passed - eGFR is 30 or above and within 360 days    GFR calc Af Amer  Date Value Ref Range Status  05/16/2020 124 >59 mL/min/1.73 Final    Comment:    **In accordance with recommendations from the NKF-ASN Task force,**   [facility] is in the process of updating its eGFR calculation to the   2021 CKD-EPI creatinine equation that estimates kidney function   without a race variable.    GFR calc non Af Amer  Date Value Ref Range Status  05/16/2020 107 >59 mL/min/1.73 Final   eGFR  Date Value Ref Range Status  05/18/2023 114 >59  mL/min/1.73 Final         Passed - Patient is not pregnant      Passed - Valid encounter within last 12 months    Recent Outpatient Visits           1 month ago Upper respiratory infection, viral   [facility] Marana, Monico Blitz, DO

## 2023-07-02 NOTE — Telephone Encounter (Signed)
 Information obtained from mother, [name].  Chief Complaint: rectal bleeding Symptoms: blood  Frequency: mother states that pt had one episode today Pertinent Negatives: Patient denies pain, blood thinner use,  Disposition: [] ED /[] Urgent Care (no appt availability in office) / [x] Appointment(In office/virtual)/ []  [place] [facility]/ [] Home Care/ [] Refused Recommended Disposition /[] [facility]/ []  Follow-up with PCP Additional Notes: Pt mother [name] calling on bahalf of pt. Mother states that pt has been suffering from constipation recently, hx of hemorroids. Mother states that she was assisting the pt in the bathroom today and noted blood in the water and when she wiped the pt. Per mother it was "not a lot of blood", mother states "regular red colored blood". Per mother the pt does not appear to be in pain, pt is autistic. PCP office did not have appts today, RN offered to view schedules at other [facility], mother refused. Mother states that she wants the pt seen today, and she can only see her PCP because she is autistic. CAL was called to confirm that there was absolutely no appts today with PCP or another physician, at mothers request. CAL confirmed there is no appts for today. Mother advised there is no appts at [facility] today. Mother states that she doesn't understand how there are no appts at [facility] today. Pt was scheduled for tomorrow with another provider in the clinic. Copied from CRM [id number]. Topic: Clinical - Red Word Triage >> Jul 02, 2023 10:59 AM Fuller Mandril wrote: Red Word that prompted transfer to Nurse Triage: Bloody stool Reason for Disposition  MILD rectal bleeding (more than just a few drops or streaks)  Answer Assessment - Initial Assessment Questions 1. APPEARANCE of BLOOD: "What color is it?" "Is it passed separately, on the surface of the stool, or mixed in with the stool?"      Regular blood red 2. AMOUNT: "How much blood was passed?"      Did not feel like it  was a lot in the toilet 3. FREQUENCY: "How many times has blood been passed with the stools?"      Once today, first time mother has seen this 4. ONSET: "When was the blood first seen in the stools?" (Days or weeks)      today 5. DIARRHEA: "Is there also some diarrhea?" If Yes, ask: "How many diarrhea stools in the past 24 hours?"      Has been having hard time defecating  6. CONSTIPATION: "Do you have constipation?" If Yes, ask: "How bad is it?"     Per mother potentially 7. RECURRENT SYMPTOMS: "Have you had blood in your stools before?" If Yes, ask: "When was the last time?" and "What happened that time?"      Hx of hemorroids 8. BLOOD THINNERS: "Do you take any blood thinners?" (e.g., Coumadin/warfarin, Pradaxa/dabigatran, aspirin)     denies 9. OTHER SYMPTOMS: "Do you have any other symptoms?"  (e.g., abdomen pain, vomiting, dizziness, fever)     denies 10. PREGNANCY: "Is there any chance you are pregnant?" "When was your last menstrual period?"       denies  Protocols used: Rectal Bleeding-A-AH

## 2023-07-03 ENCOUNTER — Ambulatory Visit: Admitting: Physician Assistant

## 2023-07-03 ENCOUNTER — Other Ambulatory Visit: Payer: Self-pay | Admitting: Family Medicine

## 2023-07-03 ENCOUNTER — Encounter: Payer: Self-pay | Admitting: Physician Assistant

## 2023-07-03 VITALS — BP 113/89 | HR 81 | Resp 16 | Wt 173.3 lb

## 2023-07-03 DIAGNOSIS — K625 Hemorrhage of anus and rectum: Secondary | ICD-10-CM | POA: Diagnosis not present

## 2023-07-03 DIAGNOSIS — K5909 Other constipation: Secondary | ICD-10-CM | POA: Diagnosis not present

## 2023-07-03 DIAGNOSIS — F840 Autistic disorder: Secondary | ICD-10-CM | POA: Diagnosis not present

## 2023-07-03 DIAGNOSIS — R609 Edema, unspecified: Secondary | ICD-10-CM

## 2023-07-03 DIAGNOSIS — K648 Other hemorrhoids: Secondary | ICD-10-CM | POA: Diagnosis not present

## 2023-07-03 NOTE — Progress Notes (Unsigned)
 Established patient visit  Patient: Joanna Robinson   DOB: Aug 30, 1980   43 y.o. Female  MRN: [id number] Visit Date: 07/03/2023  Today's healthcare provider: Blane Bunting, PA-C   Chief Complaint  Patient presents with   Rectal Bleeding    Rectal Bleeding since yesterday Some pain    Subjective     HPI     Rectal Bleeding    Additional comments: Rectal Bleeding since yesterday Some pain       Last edited by Estill Hemming, CMA on 07/03/2023  2:44 PM.       Discussed the use of AI scribe software for clinical note transcription with the patient, who gave verbal consent to proceed.  History of Present Illness        03/31/2023    2:34 PM 10/20/2022    9:27 AM 08/11/2022    2:21 PM  Depression screen PHQ 2/9  Decreased Interest 0 0 0  Down, Depressed, Hopeless 0 0 0  PHQ - 2 Score 0 0 0  Altered sleeping   0  Tired, decreased energy   0  Change in appetite   0  Feeling bad or failure about yourself    0  Trouble concentrating   0  Moving slowly or fidgety/restless   0  Suicidal thoughts   0  PHQ-9 Score   0      03/31/2023    2:34 PM  GAD 7 : Generalized Anxiety Score  Nervous, Anxious, on Edge 0  Control/stop worrying 0  Worry too much - different things 0  Trouble relaxing 0  Restless 0  Easily annoyed or irritable 0  Afraid - awful might happen 0  Total GAD 7 Score 0  Anxiety Difficulty Not difficult at all    Medications: Outpatient Medications Prior to Visit  Medication Sig   acetaZOLAMIDE (DIAMOX) 250 MG tablet TAKE 1 TABLET BY MOUTH TWICE DAILY   azelastine (ASTELIN) 0.1 % nasal spray PLACE 1 SPRAY INTO BOTH NOSTRILS TWICE DAILY.   azelastine (OPTIVAR) 0.05 % ophthalmic solution PLACE 1 DROP IN BOTH EYES TWICE DAILY   baclofen (LIORESAL) 10 MG tablet TAKE 1 TABLET BY MOUTH TWICE DAILY   benzoyl peroxide 5 % gel Apply topically 2 (two) times daily. To affected area   benzoyl peroxide-erythromycin (BENZAMYCIN) gel APPLY TO AFFECTED AREA(s) TWICE  DAILY ASDIRECTED   brompheniramine-pseudoephedrine-DM 30-2-10 MG/5ML syrup Take 5 mLs by mouth 4 (four) times daily as needed.   carbamazepine (CARBATROL) 200 MG 12 hr capsule TAKE 1 CAPSULE BY MOUTH ONCE EVERY MORNING AND 1 CAPSULE ONCE EVERY EVENINGAS DIRECTED DOSE DECREASE*   cetirizine (ZYRTEC) 10 MG tablet Take 1 tablet (10 mg total) by mouth daily.   cloNIDine (CATAPRES) 0.2 MG tablet TAKE 1 TABLET BY MOUTH AT BEDTIME   ferrous gluconate (FERGON) 324 MG tablet TAKE 1 TABLET BY MOUTH TWICE DAILY WITH MEALS   fluticasone (FLONASE) 50 MCG/ACT nasal spray Place into both nostrils.   fluvoxaMINE (LUVOX) 100 MG tablet TAKE 1 TABLET BY MOUTH TWICE DAILY   hydrochlorothiazide (HYDRODIURIL) 25 MG tablet TAKE 1 TABLET BY MOUTH ONCE DAILY   LINZESS 145 MCG CAPS capsule TAKE 1 CAPSULE BY MOUTH ONCE DAILY   MAPAP 500 MG tablet TAKE 2 TABLETS BY MOUTH 3 TIMES DAILY ASNEEDED HEADACHE   montelukast (SINGULAIR) 10 MG tablet TAKE 1 TABLET BY MOUTH ONCE EVERY EVENING   naproxen (NAPROSYN) 500 MG tablet TAKE 1 TABLET BY MOUTH TWICE DAILY   omeprazole (PRILOSEC) 40 MG  capsule TAKE 1 CAPSULE BY MOUTH ONCE DAILY   potassium chloride (KLOR-CON) 20 MEQ packet MIX AND DISSOLVE 1 PACKET IN 4OZ OF LIQUID AND DRINK ONCE DAILY   saline (AYR) GEL Place 1 Application into both nostrils every 4 (four) hours as needed.   sodium fluoride (DENTA 5000 PLUS) 1.1 % CREA dental cream Place 1 application onto teeth every evening. Brush teeth daily for 1 minute, rinse and spit.   sulfacetamide (BLEPH-10) 10 % ophthalmic solution PLACE 2 DROPS IN RIGHT EYE 4 TIMES DAILY   triamcinolone cream (KENALOG) 0.1 % APPLY TO AFFECTED AREA(s) TWICE DAILY ASDIRECTED   No facility-administered medications prior to visit.    Review of Systems All negative Except see HPI   {Insert previous labs (optional):23779} {See past labs  Heme  Chem  Endocrine  Serology  Results Review (optional):1}   Objective    BP 113/89 (BP Location:  Right Arm, Patient Position: Sitting, Cuff Size: Normal)   Pulse 81   Resp 16   Wt 173 lb 4.8 oz (78.6 kg)   SpO2 100%   BMI 26.35 kg/m  {Insert last BP/Wt (optional):23777}{See vitals history (optional):1}   Physical Exam   Results for orders placed or performed in visit on 07/03/23  CBC with Differential/Platelet  Result Value Ref Range   WBC 3.6 3.4 - 10.8 x10E3/uL   RBC 4.37 3.77 - 5.28 x10E6/uL   Hemoglobin 13.0 11.1 - 15.9 g/dL   Hematocrit 16.1 09.6 - 46.6 %   MCV 92 79 - 97 fL   MCH 29.7 26.6 - 33.0 pg   MCHC 32.5 31.5 - 35.7 g/dL   RDW 04.5 (L) 40.9 - 81.1 %   Platelets 186 150 - 450 x10E3/uL   Neutrophils 46 Not Estab. %   Lymphs 41 Not Estab. %   Monocytes 11 Not Estab. %   Eos 2 Not Estab. %   Basos 0 Not Estab. %   Neutrophils Absolute 1.7 1.4 - 7.0 x10E3/uL   Lymphocytes Absolute 1.5 0.7 - 3.1 x10E3/uL   Monocytes Absolute 0.4 0.1 - 0.9 x10E3/uL   EOS (ABSOLUTE) 0.1 0.0 - 0.4 x10E3/uL   Basophils Absolute 0.0 0.0 - 0.2 x10E3/uL   Immature Granulocytes 0 Not Estab. %   Immature Grans (Abs) 0.0 0.0 - 0.1 x10E3/uL        Assessment and Plan Assessment & Plan     Orders Placed This Encounter  Procedures   CBC with Differential/Platelet    No follow-ups on file.   The patient was advised to call back or seek an in-person evaluation if the symptoms worsen or if the condition fails to improve as anticipated.  I discussed the assessment and treatment plan with the patient. The patient was provided an opportunity to ask questions and all were answered. The patient agreed with the plan and demonstrated an understanding of the instructions.  I, Raymont Andreoni, PA-C have reviewed all documentation for this visit. The documentation on 07/03/2023  for the exam, diagnosis, procedures, and orders are all accurate and complete.  Blane Bunting, [facility], MMS [facility] [phone] (phone) [phone] (fax)  [facility]

## 2023-07-03 NOTE — Telephone Encounter (Signed)
 Copied from CRM [id number]. Topic: Clinical - Medication Refill >> Jul 03, 2023  2:50 PM Elle L wrote: Most Recent Primary Care Visit:  Provider: Sherlyn Hay  Department: [facility]  Visit Type: OFFICE VISIT  Date: 05/18/2023  Medication: hydrochlorothiazide (HYDRODIURIL) 25 MG tablet  Has the patient contacted their pharmacy? Yes, [facility] called on behalf of the patient and advised that she is a compliance package patient so she cannot refill any of her medications until this one is sent in.  Is this the correct pharmacy for this prescription? Yes  This is the patient's preferred pharmacy:  [facility] - [place], [place] - [address] [place] [address] Phone: [phone] Fax: [phone]  Has the prescription been filled recently? No  Is the patient out of the medication? Yes  Has the patient been seen for an appointment in the last year OR does the patient have an upcoming appointment? Yes  Can we respond through MyChart? No  Agent: Please be advised that Rx refills may take up to 3 business days. We ask that you follow-up with your pharmacy.

## 2023-07-04 LAB — CBC WITH DIFFERENTIAL/PLATELET
Basophils Absolute: 0 10*3/uL (ref 0.0–0.2)
Basos: 0 %
EOS (ABSOLUTE): 0.1 10*3/uL (ref 0.0–0.4)
Eos: 2 %
Hematocrit: 40 % (ref 34.0–46.6)
Hemoglobin: 13 g/dL (ref 11.1–15.9)
Immature Grans (Abs): 0 10*3/uL (ref 0.0–0.1)
Immature Granulocytes: 0 %
Lymphocytes Absolute: 1.5 10*3/uL (ref 0.7–3.1)
Lymphs: 41 %
MCH: 29.7 pg (ref 26.6–33.0)
MCHC: 32.5 g/dL (ref 31.5–35.7)
MCV: 92 fL (ref 79–97)
Monocytes Absolute: 0.4 10*3/uL (ref 0.1–0.9)
Monocytes: 11 %
Neutrophils Absolute: 1.7 10*3/uL (ref 1.4–7.0)
Neutrophils: 46 %
Platelets: 186 10*3/uL (ref 150–450)
RBC: 4.37 x10E6/uL (ref 3.77–5.28)
RDW: 11.3 % — ABNORMAL LOW (ref 11.7–15.4)
WBC: 3.6 10*3/uL (ref 3.4–10.8)

## 2023-07-06 DIAGNOSIS — K648 Other hemorrhoids: Secondary | ICD-10-CM | POA: Insufficient documentation

## 2023-07-06 DIAGNOSIS — K625 Hemorrhage of anus and rectum: Secondary | ICD-10-CM | POA: Insufficient documentation

## 2023-07-06 NOTE — Progress Notes (Signed)
 Test showed no infection, no anemia

## 2023-07-06 NOTE — Telephone Encounter (Signed)
 Duplicate request, last refilled 07/06/23.  Requested Prescriptions  Pending Prescriptions Disp Refills   hydrochlorothiazide (HYDRODIURIL) 25 MG tablet 90 tablet 3    Sig: Take 1 tablet (25 mg total) by mouth daily.     Cardiovascular: Diuretics - Thiazide Passed - 07/06/2023  9:01 AM      Passed - Cr in normal range and within 180 days    Creatinine, Ser  Date Value Ref Range Status  05/18/2023 0.65 0.57 - 1.00 mg/dL Final         Passed - K in normal range and within 180 days    Potassium  Date Value Ref Range Status  05/18/2023 3.6 3.5 - 5.2 mmol/L Final         Passed - Na in normal range and within 180 days    Sodium  Date Value Ref Range Status  05/18/2023 138 134 - 144 mmol/L Final         Passed - Last BP in normal range    BP Readings from Last 1 Encounters:  07/03/23 113/89         Passed - Valid encounter within last 6 months    Recent Outpatient Visits           3 days ago Rectal bleeding   [facility] [place], [place], PA-C   1 month ago Upper respiratory infection, viral   [facility] [place], Asencion Blacksmith, DO

## 2023-07-28 ENCOUNTER — Other Ambulatory Visit: Payer: Self-pay | Admitting: Family Medicine

## 2023-07-28 DIAGNOSIS — R0981 Nasal congestion: Secondary | ICD-10-CM

## 2023-08-27 ENCOUNTER — Other Ambulatory Visit: Payer: Self-pay | Admitting: Family Medicine

## 2023-08-27 ENCOUNTER — Other Ambulatory Visit (INDEPENDENT_AMBULATORY_CARE_PROVIDER_SITE_OTHER): Payer: Self-pay | Admitting: Family

## 2023-08-27 DIAGNOSIS — M62838 Other muscle spasm: Secondary | ICD-10-CM

## 2023-08-27 DIAGNOSIS — G40209 Localization-related (focal) (partial) symptomatic epilepsy and epileptic syndromes with complex partial seizures, not intractable, without status epilepticus: Secondary | ICD-10-CM

## 2023-08-28 ENCOUNTER — Telehealth: Payer: Self-pay | Admitting: Family Medicine

## 2023-08-28 DIAGNOSIS — M62838 Other muscle spasm: Secondary | ICD-10-CM

## 2023-08-28 MED ORDER — BACLOFEN 10 MG PO TABS
10.0000 mg | ORAL_TABLET | Freq: Two times a day (BID) | ORAL | 0 refills | Status: DC
Start: 2023-08-28 — End: 2023-11-26

## 2023-08-28 NOTE — Telephone Encounter (Signed)
 Copied from CRM [id number]. Topic: Clinical - Medication Refill >> Aug 28, 2023  1:44 PM Santiya F wrote: [facility] is calling in because they sent a request for a refill for baclofen  (LIORESAL ) 10 MG tablet [[id number]] and they haven't gotten anything from the office. [facility] says that patient is a compliance patient and the rest of her medications are being held up because they're waiting on this one.

## 2023-09-07 ENCOUNTER — Other Ambulatory Visit: Payer: Self-pay | Admitting: Family Medicine

## 2023-09-23 ENCOUNTER — Other Ambulatory Visit: Payer: Self-pay | Admitting: Family Medicine

## 2023-09-23 ENCOUNTER — Telehealth: Payer: Self-pay | Admitting: Family Medicine

## 2023-09-23 DIAGNOSIS — N946 Dysmenorrhea, unspecified: Secondary | ICD-10-CM

## 2023-09-23 DIAGNOSIS — K219 Gastro-esophageal reflux disease without esophagitis: Secondary | ICD-10-CM

## 2023-09-23 NOTE — Telephone Encounter (Signed)
 [facility] is requesting refill montelukast  (SINGULAIR ) 10 MG tablet  Please advise

## 2023-09-24 ENCOUNTER — Other Ambulatory Visit: Payer: Self-pay

## 2023-09-24 DIAGNOSIS — J302 Other seasonal allergic rhinitis: Secondary | ICD-10-CM

## 2023-09-24 MED ORDER — MONTELUKAST SODIUM 10 MG PO TABS: 10.0000 mg | ORAL_TABLET | Freq: Every day | ORAL | 3 refills | Status: AC

## 2023-09-24 NOTE — Telephone Encounter (Signed)
 [facility] called and stated that pt is completely out of these medications and wanted to check the status.  Please advise

## 2023-09-24 NOTE — Telephone Encounter (Signed)
 Rx refill sent.

## 2023-09-24 NOTE — Telephone Encounter (Signed)
 Fluvoxamine  LOV 07-03-23 NOV 10-21-23 LRF 07-02-23 qty:90 r:0  naproxen  LOV 07-03-23 NOV 10-21-23 LRF 07-02-23 qty:90 r:0

## 2023-10-21 ENCOUNTER — Ambulatory Visit: Payer: Medicare Other

## 2023-10-21 DIAGNOSIS — Z0000 Encounter for general adult medical examination without abnormal findings: Secondary | ICD-10-CM | POA: Diagnosis not present

## 2023-10-21 DIAGNOSIS — Z1231 Encounter for screening mammogram for malignant neoplasm of breast: Secondary | ICD-10-CM

## 2023-10-21 NOTE — Progress Notes (Signed)
 Subjective:   Joanna Robinson is a 43 y.o. who presents for a Medicare Wellness preventive visit.  As a reminder, Annual Wellness Visits don't include a physical exam, and some assessments may be limited, especially if this visit is performed virtually. We may recommend an in-person follow-up visit with your provider if needed.  Visit Complete: Virtual I connected with  Joanna Robinson on 10/21/23 by a audio enabled telemedicine application and verified that I am speaking with the correct person using two identifiers.  Patient Location: Home  Provider Location: Home Office  I discussed the limitations of evaluation and management by telemedicine. The patient expressed understanding and agreed to proceed.  Vital Signs: Because this visit was a virtual/telehealth visit, some criteria may be missing or patient reported. Any vitals not documented were not able to be obtained and vitals that have been documented are patient reported.  VideoDeclined- This patient declined Librarian, academic. Therefore the visit was completed with audio only.  Persons Participating in Visit: Patient assisted by MOM.  AWV Questionnaire: No: Patient Medicare AWV questionnaire was not completed prior to this visit.  Cardiac Risk Factors include: dyslipidemia     Objective:    There were no vitals filed for this visit. There is no height or weight on file to calculate BMI.     10/21/2023    9:32 AM 10/20/2022    9:41 AM 10/17/2021    8:26 AM  Advanced Directives  Does Patient Have a Medical Advance Directive? No Yes No  Type of Advance Directive  Healthcare Power of Attorney   Would patient like information on creating a medical advance directive? No - Patient declined  No - Patient declined    Current Medications (verified) Outpatient Encounter Medications as of 10/21/2023  Medication Sig   acetaZOLAMIDE  (DIAMOX ) 250 MG tablet TAKE 1 TABLET BY MOUTH TWICE DAILY    azelastine  (ASTELIN ) 0.1 % nasal spray PLACE 1 SPRAY INTO BOTH NOSTRILS TWICE DAILY.   azelastine  (OPTIVAR ) 0.05 % ophthalmic solution PLACE 1 DROP IN BOTH EYES TWICE DAILY   baclofen  (LIORESAL ) 10 MG tablet Take 1 tablet (10 mg total) by mouth 2 (two) times daily.   benzoyl peroxide  5 % gel Apply topically 2 (two) times daily. To affected area   benzoyl peroxide -erythromycin  (BENZAMYCIN) gel APPLY TO AFFECTED AREA(s) TWICE DAILY ASDIRECTED   brompheniramine-pseudoephedrine-DM 30-2-10 MG/5ML syrup Take 5 mLs by mouth 4 (four) times daily as needed.   carbamazepine  (CARBATROL ) 200 MG 12 hr capsule TAKE 1 CAPSULE BY MOUTH ONCE EVERY MORNING AND 1 CAPSULE ONCE EVERY EVENINGAS DIRECTED DOSE DECREASE*   cetirizine  (ZYRTEC ) 10 MG tablet Take 1 tablet (10 mg total) by mouth daily.   cloNIDine  (CATAPRES ) 0.2 MG tablet TAKE 1 TABLET BY MOUTH AT BEDTIME   ferrous gluconate  (FERGON) 324 MG tablet TAKE 1 TABLET BY MOUTH TWICE DAILY WITH MEALS (Patient taking differently: TAKES ONCE PER DAY)   Fluocinolone Acetonide Scalp 0.01 % OIL APPLY TO AFFECTED AREA(s) AS DIRECTED UPTO 1-2 TIMES A DAY   fluticasone  (FLONASE ) 50 MCG/ACT nasal spray Place into both nostrils.   fluvoxaMINE  (LUVOX ) 100 MG tablet TAKE 1 TABLET BY MOUTH TWICE DAILY   hydrochlorothiazide  (HYDRODIURIL ) 25 MG tablet TAKE 1 TABLET BY MOUTH ONCE DAILY   LINZESS  145 MCG CAPS capsule TAKE 1 CAPSULE BY MOUTH ONCE DAILY   MAPAP 500 MG tablet TAKE 2 TABLETS BY MOUTH 3 TIMES DAILY ASNEEDED HEADACHE   montelukast  (SINGULAIR ) 10 MG tablet Take 1  tablet (10 mg total) by mouth at bedtime.   omeprazole  (PRILOSEC) 40 MG capsule TAKE 1 CAPSULE BY MOUTH ONCE DAILY   potassium chloride  (KLOR-CON ) 20 MEQ packet MIX AND DISSOLVE 1 PACKET IN 4OZ OF LIQUID AND DRINK ONCE DAILY   saline (AYR) GEL Place 1 Application into both nostrils every 4 (four) hours as needed.   sodium fluoride  (DENTA 5000 PLUS) 1.1 % CREA dental cream Place 1 application onto teeth every  evening. Brush teeth daily for 1 minute, rinse and spit.   sulfacetamide  (BLEPH -10) 10 % ophthalmic solution PLACE 2 DROPS IN RIGHT EYE 4 TIMES DAILY (Patient taking differently: PLACE 2 DROPS IN RIGHT EYE 4 TIMES DAILY TAKES PRN)   triamcinolone  cream (KENALOG ) 0.1 % APPLY TO AFFECTED AREA(s) TWICE DAILY ASDIRECTED   naproxen  (NAPROSYN ) 500 MG tablet TAKE 1 TABLET BY MOUTH TWICE DAILY (Patient not taking: Reported on 10/21/2023)   No facility-administered encounter medications on file as of 10/21/2023.    Allergies (verified) Codeine and Corticosteroids   History: Past Medical History:  Diagnosis Date   Autism    Epilepsy (HCC)    Hemorrhoid    Hyperlipidemia    Seizures (HCC)    Past Surgical History:  Procedure Laterality Date   NO PAST SURGERIES     Family History  Problem Relation Age of Onset   Alzheimer's disease Maternal Grandmother        Died at 28   Alzheimer's disease Paternal Grandfather        Died at 86   Social History   Socioeconomic History   Marital status: Single    Spouse name: Not on file   Number of children: Not on file   Years of education: Not on file   Highest education level: Not on file  Occupational History   Not on file  Tobacco Use   Smoking status: Never   Smokeless tobacco: Never  Vaping Use   Vaping status: Never Used  Substance and Sexual Activity   Alcohol use: No   Drug use: No   Sexual activity: Never  Other Topics Concern   Not on file  Social History Narrative   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    She has a nurse that comes to her home and they go out on field trips. (2 days a week)    She enjoys exercise, dance, and riding bike. She lives with her mother.   Social Drivers of Corporate investment banker Strain: Low Risk  (10/21/2023)   Overall Financial Resource Strain (CARDIA)    Difficulty of Paying Living Expenses: Not hard at all  Food Insecurity: No Food Insecurity (10/21/2023)   Hunger Vital Sign    Worried  About Running Out of Food in the Last Year: Never true    Ran Out of Food in the Last Year: Never true  Transportation Needs: No Transportation Needs (10/21/2023)   PRAPARE - Administrator, Civil Service (Medical): No    Lack of Transportation (Non-Medical): No  Physical Activity: Insufficiently Active (10/21/2023)   Exercise Vital Sign    Days of Exercise per Week: 2 days    Minutes of Exercise per Session: 30 min  Stress: No Stress Concern Present (10/21/2023)   [employer] of Occupational Health - Occupational Stress Questionnaire    Feeling of Stress: Not at all  Social Connections: Socially Isolated (10/21/2023)   Social Connection and Isolation Panel    Frequency of Communication with Friends and Family:  Once a week    Frequency of Social Gatherings with Friends and Family: Once a week    Attends Religious Services: More than 4 times per year    Active Member of [employer] or Organizations: No    Attends Engineer, structural: Never    Marital Status: Never married    Tobacco Counseling Counseling given: Not Answered    Clinical Intake:  Pre-visit preparation completed: Yes  Pain : No/denies pain     BMI - recorded: 26.3 Nutritional Status: BMI 25 -29 Overweight Nutritional Risks: None Diabetes: No  Lab Results  Component Value Date   HGBA1C 5.2 12/30/2019   HGBA1C 5.6 12/20/2018   HGBA1C 5.7 (H) 10/01/2018     How often do you need to have someone help you when you read instructions, pamphlets, or other written materials from your doctor or pharmacy?: 5 - Always  Interpreter Needed?: No  Information entered by :: JHONNIE DAS, LPN   Activities of Daily Living     10/21/2023    9:33 AM  In your present state of health, do you have any difficulty performing the following activities:  Hearing? 0  Vision? 0  Difficulty concentrating or making decisions? 1  Walking or climbing stairs? 0  Dressing or bathing? 1  Doing errands,  shopping? 1  Preparing Food and eating ? N  Using the Toilet? N  In the past six months, have you accidently leaked urine? N  Do you have problems with loss of bowel control? N  Managing your Medications? Y  Managing your Finances? Y  Housekeeping or managing your Housekeeping? Y    Patient Care Team: Donzella Lauraine SAILOR, DO as PCP - General (Family Medicine) Sankar, Seeplaputhur G, MD (General Surgery) Agapito Mom, MD as Referring Physician (Family Medicine) [place], NP as Nurse Practitioner (Neurology)  I have updated your Care Teams any recent Medical Services you may have received from other providers in the past year.     Assessment:   This is a routine wellness examination for Joanna Robinson.  Hearing/Vision screen Hearing Screening - Comments:: NO AIDS Vision Screening - Comments:: NO GLASSES   Goals Addressed             This Visit's Progress    DIET - INCREASE WATER INTAKE         Depression Screen     10/21/2023    9:30 AM 03/31/2023    2:34 PM 10/20/2022    9:27 AM 08/11/2022    2:21 PM 10/23/2021   11:32 AM 10/17/2021    8:25 AM 12/30/2019    7:22 PM  PHQ 2/9 Scores  PHQ - 2 Score 0 0 0 0 0 0 0  PHQ- 9 Score 0   0 0 0 3    Fall Risk     10/21/2023    9:33 AM 03/31/2023    2:33 PM 10/20/2022    9:22 AM 08/11/2022    2:21 PM 10/23/2021   11:32 AM  Fall Risk   Falls in the past year? 0 0 0 0 0  Number falls in past yr: 0 0 0 0 0  Injury with Fall? 0 0 0 0 0  Risk for fall due to : No Fall Risks No Fall Risks No Fall Risks No Fall Risks No Fall Risks  Follow up Falls evaluation completed;Falls prevention discussed  Education provided;Falls prevention discussed Falls evaluation completed     MEDICARE RISK AT HOME:     TIMED  UP AND GO:  Was the test performed?  No  Cognitive Function: Impaired: Patient has current diagnosis of cognitive impairment.        Immunizations Immunization History  Administered Date(s) Administered    Influenza,inj,Quad PF,6+ Mos 01/07/2013, 01/05/2018, 02/07/2019, 12/30/2019, 01/31/2021   PFIZER(Purple Top)SARS-COV-2 Vaccination 08/16/2019, 09/06/2019, 03/09/2020   Td 12/20/2004, 10/23/2021   Tdap 08/16/2010    Screening Tests Health Maintenance  Topic Date Due   Pneumococcal Vaccine 40-26 Years old (1 of 2 - PCV) Never done   Hepatitis B Vaccines (1 of 3 - 19+ 3-dose series) Never done   HPV VACCINES (1 - 3-dose SCDM series) Never done   Cervical Cancer Screening (HPV/Pap Cotest)  Never done   COVID-19 Vaccine (4 - 2024-25 season) 11/23/2022   INFLUENZA VACCINE  10/23/2023   Medicare Annual Wellness (AWV)  10/20/2024   DTaP/Tdap/Td (4 - Td or Tdap) 10/24/2031   Hepatitis C Screening  Completed   Meningococcal B Vaccine  Aged Out   HIV Screening  Discontinued    Health Maintenance  Health Maintenance Due  Topic Date Due   Pneumococcal Vaccine 58-29 Years old (1 of 2 - PCV) Never done   Hepatitis B Vaccines (1 of 3 - 19+ 3-dose series) Never done   HPV VACCINES (1 - 3-dose SCDM series) Never done   Cervical Cancer Screening (HPV/Pap Cotest)  Never done   COVID-19 Vaccine (4 - 2024-25 season) 11/23/2022   Health Maintenance Items Addressed: MAMMOGRAM PREVIOUSLY ORDERED BY MD; DECLINES SHOTS; UP TO DATE ON TDAP  Additional Screening:  Vision Screening: Recommended annual ophthalmology exams for early detection of glaucoma and other disorders of the eye. Would you like a referral to an eye doctor? No    Dental Screening: Recommended annual dental exams for proper oral hygiene  Community Resource Referral / Chronic Care Management: CRR required this visit?  No   CCM required this visit?  No   Plan:    I have personally reviewed and noted the following in the patient's chart:   Medical and social history Use of alcohol, tobacco or illicit drugs  Current medications and supplements including opioid prescriptions. Patient is not currently taking opioid  prescriptions. Functional ability and status Nutritional status Physical activity Advanced directives List of other physicians Hospitalizations, surgeries, and ER visits in previous 12 months Vitals Screenings to include cognitive, depression, and falls Referrals and appointments  In addition, I have reviewed and discussed with patient certain preventive protocols, quality metrics, and best practice recommendations. A written personalized care plan for preventive services as well as general preventive health recommendations were provided to patient.   Jhonnie GORMAN Das, LPN   [id number]/7974   After Visit Summary: (MyChart) Due to this being a telephonic visit, the after visit summary with patients personalized plan was offered to patient via MyChart   Notes: NONE

## 2023-10-21 NOTE — Patient Instructions (Signed)
 Ms. Ramser , Thank you for taking time out of your busy schedule to complete your Annual Wellness Visit with me. I enjoyed our conversation and look forward to speaking with you again next year. I, as well as your care team,  appreciate your ongoing commitment to your health goals. Please review the following plan we discussed and let me know if I can assist you in the future.    REFERRAL SENT FOR MAMMOGRAM   You have an order for:  []   2D Mammogram  [x]   3D Mammogram  []   Bone Density     Please call for appointment:  [facility]  [address] [place] [address] [phone] [facility] [address] [place], [place] [address] [phone] [facility] [address] [name], [place] [address] [phone]   Make sure to wear two-piece clothing.  No lotions, powders, or deodorants the day of the appointment. Make sure to bring picture ID and insurance card.  Bring list of medications you are currently taking including any supplements.   Schedule your [place] screening mammogram through MyChart!   Log into your MyChart account.  Go to 'Visit' (or 'Appointments' if on mobile App) --> Schedule an Appointment  Under 'Select a Reason for Visit' choose the Mammogram Screening option.  Complete the pre-visit questions and select the time and place that best fits your schedule.    Follow up Visits: Next Medicare AWV with our clinical staff:   10/26/24 @ 8:50 AM BY PHONE Have you seen your provider in the last 6 months (3 months if uncontrolled diabetes)? Yes  Clinician Recommendations:  Aim for 30 minutes of exercise or brisk walking, 6-8 glasses of water, and 5 servings of fruits and vegetables each day. TAKE CARE!      This is a list of the screening recommended for you and due dates:  Health Maintenance  Topic Date Due   Mammogram  Never done    Pneumococcal Vaccination (1 of 2 - PCV) Never done   Hepatitis B Vaccine (1 of 3 - 19+ 3-dose series) Never done   HPV Vaccine (1 - 3-dose SCDM series) Never done   Pap with HPV screening  Never done   COVID-19 Vaccine (4 - 2024-25 season) 11/23/2022   Flu Shot  10/23/2023   Medicare Annual Wellness Visit  10/20/2024   DTaP/Tdap/Td vaccine (4 - Td or Tdap) 10/24/2031   Hepatitis C Screening  Completed   Meningitis B Vaccine  Aged Out   HIV Screening  Discontinued    Advanced directives: (ACP Link)Information on Advanced Care Planning can be found at [place]  Secret[facility] Advance Health Care Directives Advance Health Care Directives. [email or web address]  Advance Care Planning is important because it:  [x]  Makes sure you receive the medical care that is consistent with your values, goals, and preferences  [x]  It provides guidance to your family and loved ones and reduces their decisional burden about whether or not they are making the right decisions based on your wishes.  Follow the link provided in your after visit summary or read over the paperwork we have mailed to you to help you started getting your Advance Directives in place. If you need assistance in completing these, please reach out to us  so that we can help you!

## 2023-11-09 ENCOUNTER — Telehealth: Payer: Self-pay

## 2023-11-09 DIAGNOSIS — D702 Other drug-induced agranulocytosis: Secondary | ICD-10-CM

## 2023-11-09 DIAGNOSIS — Z79899 Other long term (current) drug therapy: Secondary | ICD-10-CM

## 2023-11-09 DIAGNOSIS — G40209 Localization-related (focal) (partial) symptomatic epilepsy and epileptic syndromes with complex partial seizures, not intractable, without status epilepticus: Secondary | ICD-10-CM

## 2023-11-09 DIAGNOSIS — E7800 Pure hypercholesterolemia, unspecified: Secondary | ICD-10-CM

## 2023-11-09 NOTE — Telephone Encounter (Signed)
 Copied from CRM #[id number]. Topic: Clinical - Request for Lab/Test Order >> Nov 09, 2023  9:26 AM Berwyn MATSU wrote: Reason for CRM:  patient called in to request lab orders to be sent so she can do labs this week prior to 11/26/23 appointment.   May you please assist.

## 2023-11-10 NOTE — Telephone Encounter (Unsigned)
 Copied from CRM #[id number]. Topic: Clinical - Request for Lab/Test Order >> Nov 10, 2023 10:29 AM Myrick T wrote: Reason for CRM: patients mom is asking for a print out of patients lab orders. Mom requesting a call from the nurse to advise of which labs patient needs

## 2023-11-13 NOTE — Addendum Note (Signed)
 Addended by: DONZELLA DOMINO on: 11/13/2023 08:05 AM   Modules accepted: Orders

## 2023-11-13 NOTE — Telephone Encounter (Signed)
 Called patient's mother and informed her that the provider has put the orders in for the patient's labs and they can come to get the labs done.  She verbalized understanding.

## 2023-11-17 ENCOUNTER — Telehealth: Payer: Self-pay

## 2023-11-17 NOTE — Telephone Encounter (Signed)
 Copied from CRM #[id number]. Topic: Clinical - Medical Advice >> Nov 17, 2023 10:30 AM Selinda RAMAN wrote: Reason for CRM: Myrick the mother of the patient called in stating Dr Donzella knows that the patient has a history of neck issues but Myrick says she feels a little different about how the patient is presenting. She seems extra stiff and is not moving the same. She did say she normally has neck issues while on her cycle which she is now but this just seems different. She is asking if ian ax ray of her neck can be ordered across the way at the Outpatient imaging as well as if a muscle relaxer can be called in as her provider has seen her previously for similar issues. She has also scheduled an appointment for tomorrow afternoon with provider Janna. Please assist patient further.

## 2023-11-18 ENCOUNTER — Encounter: Payer: Self-pay | Admitting: Physician Assistant

## 2023-11-18 ENCOUNTER — Ambulatory Visit (INDEPENDENT_AMBULATORY_CARE_PROVIDER_SITE_OTHER): Admitting: Physician Assistant

## 2023-11-18 VITALS — BP 98/72 | HR 66 | Temp 98.1°F | Ht 68.0 in | Wt 177.0 lb

## 2023-11-18 DIAGNOSIS — J029 Acute pharyngitis, unspecified: Secondary | ICD-10-CM

## 2023-11-18 DIAGNOSIS — M549 Dorsalgia, unspecified: Secondary | ICD-10-CM

## 2023-11-18 DIAGNOSIS — M542 Cervicalgia: Secondary | ICD-10-CM | POA: Diagnosis not present

## 2023-11-18 DIAGNOSIS — Z79899 Other long term (current) drug therapy: Secondary | ICD-10-CM | POA: Diagnosis not present

## 2023-11-18 DIAGNOSIS — M436 Torticollis: Secondary | ICD-10-CM | POA: Diagnosis not present

## 2023-11-18 DIAGNOSIS — G40209 Localization-related (focal) (partial) symptomatic epilepsy and epileptic syndromes with complex partial seizures, not intractable, without status epilepticus: Secondary | ICD-10-CM | POA: Diagnosis not present

## 2023-11-18 DIAGNOSIS — F840 Autistic disorder: Secondary | ICD-10-CM

## 2023-11-18 DIAGNOSIS — E7800 Pure hypercholesterolemia, unspecified: Secondary | ICD-10-CM | POA: Diagnosis not present

## 2023-11-18 DIAGNOSIS — N946 Dysmenorrhea, unspecified: Secondary | ICD-10-CM | POA: Diagnosis not present

## 2023-11-18 DIAGNOSIS — J302 Other seasonal allergic rhinitis: Secondary | ICD-10-CM

## 2023-11-18 NOTE — Progress Notes (Signed)
 Established patient visit  Patient: Joanna Robinson   DOB: 1980-06-28   43 y.o. Female  MRN: [id number] Visit Date: 11/18/2023  Today's healthcare provider: Jolynn Spencer, PA-C   Chief Complaint  Patient presents with   Neck Pain    Patient's mother reports pain and stiffness in neck starting yesterday. Reports that patient was unable to turn head and mom had to help patient up. Reports that today pain and stiffness is better, states this will happen and usually when she is menstruating    Subjective     HPI     Neck Pain    Additional comments: Patient's mother reports pain and stiffness in neck starting yesterday. Reports that patient was unable to turn head and mom had to help patient up. Reports that today pain and stiffness is better, states this will happen and usually when she is menstruating       Last edited by Cherry Chiquita HERO, CMA on 11/18/2023  2:12 PM.       Discussed the use of AI scribe software for clinical note transcription with the patient, who gave verbal consent to proceed.  History of Present Illness Joanna Robinson is a 43 year old female who presents with neck pain and stiffness.  She experiences neck pain and stiffness, which was severe yesterday, requiring assistance to sit up. Today, she can get up without help, indicating improvement. The pain is described as a 'fuss' and is managed with Tylenol and heat. She is concerned about potential dependency on muscle relaxants. There is no fever or signs of infection. She is on the fifth day of her menstrual cycle, which she thinks might be related to her symptoms. She also has a sore throat but no fever, malaise, or current neck stiffness.       10/21/2023    9:30 AM 03/31/2023    2:34 PM 10/20/2022    9:27 AM  Depression screen PHQ 2/9  Decreased Interest 0 0 0  Down, Depressed, Hopeless 0 0 0  PHQ - 2 Score 0 0 0  Altered sleeping 0    Tired, decreased energy 0    Change in appetite 0    Feeling bad  or failure about yourself  0    Trouble concentrating 0    Moving slowly or fidgety/restless 0    Suicidal thoughts 0    PHQ-9 Score 0    Difficult doing work/chores Not difficult at all        03/31/2023    2:34 PM  GAD 7 : Generalized Anxiety Score  Nervous, Anxious, on Edge 0  Control/stop worrying 0  Worry too much - different things 0  Trouble relaxing 0  Restless 0  Easily annoyed or irritable 0  Afraid - awful might happen 0  Total GAD 7 Score 0  Anxiety Difficulty Not difficult at all    Medications: Outpatient Medications Prior to Visit  Medication Sig   acetaZOLAMIDE  (DIAMOX ) 250 MG tablet TAKE 1 TABLET BY MOUTH TWICE DAILY   azelastine  (ASTELIN ) 0.1 % nasal spray PLACE 1 SPRAY INTO BOTH NOSTRILS TWICE DAILY.   azelastine  (OPTIVAR ) 0.05 % ophthalmic solution PLACE 1 DROP IN BOTH EYES TWICE DAILY   baclofen  (LIORESAL ) 10 MG tablet Take 1 tablet (10 mg total) by mouth 2 (two) times daily.   benzoyl peroxide  5 % gel Apply topically 2 (two) times daily. To affected area   benzoyl peroxide -erythromycin  (BENZAMYCIN) gel APPLY TO AFFECTED AREA(s) TWICE DAILY ASDIRECTED  brompheniramine-pseudoephedrine-DM 30-2-10 MG/5ML syrup Take 5 mLs by mouth 4 (four) times daily as needed.   carbamazepine  (CARBATROL ) 200 MG 12 hr capsule TAKE 1 CAPSULE BY MOUTH ONCE EVERY MORNING AND 1 CAPSULE ONCE EVERY EVENINGAS DIRECTED DOSE DECREASE*   cetirizine  (ZYRTEC ) 10 MG tablet Take 1 tablet (10 mg total) by mouth daily.   cloNIDine  (CATAPRES ) 0.2 MG tablet TAKE 1 TABLET BY MOUTH AT BEDTIME   ferrous gluconate  (FERGON) 324 MG tablet TAKE 1 TABLET BY MOUTH TWICE DAILY WITH MEALS (Patient taking differently: TAKES ONCE PER DAY)   Fluocinolone Acetonide Scalp 0.01 % OIL APPLY TO AFFECTED AREA(s) AS DIRECTED UPTO 1-2 TIMES A DAY   fluticasone  (FLONASE ) 50 MCG/ACT nasal spray Place into both nostrils.   fluvoxaMINE  (LUVOX ) 100 MG tablet TAKE 1 TABLET BY MOUTH TWICE DAILY   hydrochlorothiazide   (HYDRODIURIL ) 25 MG tablet TAKE 1 TABLET BY MOUTH ONCE DAILY   LINZESS  145 MCG CAPS capsule TAKE 1 CAPSULE BY MOUTH ONCE DAILY   MAPAP 500 MG tablet TAKE 2 TABLETS BY MOUTH 3 TIMES DAILY ASNEEDED HEADACHE   montelukast  (SINGULAIR ) 10 MG tablet Take 1 tablet (10 mg total) by mouth at bedtime.   naproxen  (NAPROSYN ) 500 MG tablet TAKE 1 TABLET BY MOUTH TWICE DAILY   omeprazole  (PRILOSEC) 40 MG capsule TAKE 1 CAPSULE BY MOUTH ONCE DAILY   potassium chloride  (KLOR-CON ) 20 MEQ packet MIX AND DISSOLVE 1 PACKET IN 4OZ OF LIQUID AND DRINK ONCE DAILY   saline (AYR) GEL Place 1 Application into both nostrils every 4 (four) hours as needed.   sodium fluoride  (DENTA 5000 PLUS) 1.1 % CREA dental cream Place 1 application onto teeth every evening. Brush teeth daily for 1 minute, rinse and spit.   sulfacetamide  (BLEPH -10) 10 % ophthalmic solution PLACE 2 DROPS IN RIGHT EYE 4 TIMES DAILY (Patient taking differently: PLACE 2 DROPS IN RIGHT EYE 4 TIMES DAILY TAKES PRN)   triamcinolone  cream (KENALOG ) 0.1 % APPLY TO AFFECTED AREA(s) TWICE DAILY ASDIRECTED   No facility-administered medications prior to visit.    Review of Systems All negative Except see HPI   {Insert previous labs (optional):23779} {See past labs  Heme  Chem  Endocrine  Serology  Results Review (optional):1}   Objective    BP 98/72 (BP Location: Right Arm, Patient Position: Sitting, Cuff Size: Normal)   Pulse 66   Temp 98.1 F (36.7 C) (Oral)   Ht 5' 8 (1.727 m)   Wt 177 lb (80.3 kg)   LMP 11/12/2023 (Exact Date)   SpO2 100%   BMI 26.91 kg/m  {Insert last BP/Wt (optional):23777}{See vitals history (optional):1}   Physical Exam   No results found for any visits on 11/18/23.      Assessment and Plan Assessment & Plan Neck pain and stiffness (Cervicalgia) Intermittent neck pain and stiffness likely due to inactivity. Symptoms improved with Tylenol and heat. Voltaren gel suggested for topical relief. - Continue Tylenol  as needed. - Apply heat to affected area. - Encourage regular stretching exercises. - Consider Voltaren gel for topical relief. - Evaluate ice versus heat for acute pain relief.  Sore throat with postnasal drainage, likely allergic (Chronic rhinitis and acute pharyngitis) Sore throat with postnasal drainage likely due to allergies. No infection signs. Nasal spray or rinse may help. - Use saline nasal rinse or spray. - Consider Flonase  if symptoms worsen. - Gargle with warm salt water. - Apply Vicks VapoRub.  Menstrual-related symptoms (Dysmenorrhea) Menstrual-related pain likely due to menstrual cycle. Symptoms improved as  period progresses. Massage therapy suggested for pain relief. - Encourage regular stretching exercises before menstrual cycle. - Consider massage therapy along spine. - Use a massager for convenience.  1. Stiffness of neck (Primary) ***  2. Neck pain ***  3. Upper back pain ***  4. Sore throat ***  5. Other seasonal allergic rhinitis ***  6. Dysmenorrhea ***   No orders of the defined types were placed in this encounter.   No follow-ups on file.   The patient was advised to call back or seek an in-person evaluation if the symptoms worsen or if the condition fails to improve as anticipated.  I discussed the assessment and treatment plan with the patient. The patient was provided an opportunity to ask questions and all were answered. The patient agreed with the plan and demonstrated an understanding of the instructions.  I, Lennon Boutwell, PA-C have reviewed all documentation for this visit. The documentation on 11/18/2023  for the exam, diagnosis, procedures, and orders are all accurate and complete.  Jolynn Spencer, [facility], MMS [facility] [phone] (phone) [phone] (fax)  [facility]

## 2023-11-19 ENCOUNTER — Ambulatory Visit: Payer: Self-pay | Admitting: Family Medicine

## 2023-11-19 LAB — COMPREHENSIVE METABOLIC PANEL WITH GFR
ALT: 12 IU/L (ref 0–32)
AST: 16 IU/L (ref 0–40)
Albumin: 4 g/dL (ref 3.9–4.9)
Alkaline Phosphatase: 90 IU/L (ref 44–121)
BUN/Creatinine Ratio: 17 (ref 9–23)
BUN: 13 mg/dL (ref 6–24)
Bilirubin Total: <0.2 mg/dL (ref 0.0–1.2)
CO2: 22 mmol/L (ref 20–29)
Calcium: 9.5 mg/dL (ref 8.7–10.2)
Chloride: 104 mmol/L (ref 96–106)
Creatinine, Ser: 0.78 mg/dL (ref 0.57–1.00)
Globulin, Total: 2.5 g/dL (ref 1.5–4.5)
Glucose: 95 mg/dL (ref 70–99)
Potassium: 3.9 mmol/L (ref 3.5–5.2)
Sodium: 138 mmol/L (ref 134–144)
Total Protein: 6.5 g/dL (ref 6.0–8.5)
eGFR: 98 mL/min/1.73 (ref >59)

## 2023-11-19 LAB — CBC
Hematocrit: 40.2 % (ref 34.0–46.6)
Hemoglobin: 12.6 g/dL (ref 11.1–15.9)
MCH: 30.6 pg (ref 26.6–33.0)
MCHC: 31.3 g/dL — ABNORMAL LOW (ref 31.5–35.7)
MCV: 98 fL — ABNORMAL HIGH (ref 79–97)
Platelets: 190 x10E3/uL (ref 150–450)
RBC: 4.12 x10E6/uL (ref 3.77–5.28)
RDW: 11.7 % (ref 11.7–15.4)
WBC: 4.2 x10E3/uL (ref 3.4–10.8)

## 2023-11-19 LAB — LIPID PANEL
Chol/HDL Ratio: 2.7 ratio (ref 0.0–4.4)
Cholesterol, Total: 216 mg/dL — ABNORMAL HIGH (ref 100–199)
HDL: 80 mg/dL (ref >39)
LDL Chol Calc (NIH): 128 mg/dL — ABNORMAL HIGH (ref 0–99)
Triglycerides: 47 mg/dL (ref 0–149)
VLDL Cholesterol Cal: 8 mg/dL (ref 5–40)

## 2023-11-19 LAB — TSH RFX ON ABNORMAL TO FREE T4: TSH: 2.07 u[IU]/mL (ref 0.450–4.500)

## 2023-11-19 LAB — CARBAMAZEPINE LEVEL, TOTAL: Carbamazepine (Tegretol), S: 11.9 ug/mL (ref 4.0–12.0)

## 2023-11-24 ENCOUNTER — Other Ambulatory Visit: Payer: Self-pay | Admitting: Family Medicine

## 2023-11-24 DIAGNOSIS — M62838 Other muscle spasm: Secondary | ICD-10-CM

## 2023-11-25 ENCOUNTER — Telehealth: Payer: Self-pay

## 2023-11-25 NOTE — Telephone Encounter (Signed)
 Rescheduled appt to 11/27/23

## 2023-11-25 NOTE — Telephone Encounter (Signed)
 Mandy from [facility] called to report that the pt is completely out of her current supply.

## 2023-11-25 NOTE — Telephone Encounter (Signed)
 LVM for mom and guardian Myrick to return call, can you please assist her with rescheduling the appointment if that is what she desires?

## 2023-11-25 NOTE — Telephone Encounter (Signed)
 Copied from CRM #[id number]. Topic: General - Call Back - No Documentation >> Nov 25, 2023 12:57 PM Rachelle R wrote: Reason for CRM: Patient states she got a call but no message was left. Called earlier to see if her appointment tomorrow could be later in the afternoon and is inquiring if that is why she was called.  Patient can be reached at [phone]

## 2023-11-26 ENCOUNTER — Ambulatory Visit: Admitting: Family Medicine

## 2023-11-27 ENCOUNTER — Other Ambulatory Visit: Payer: Self-pay | Admitting: Family Medicine

## 2023-11-27 ENCOUNTER — Telehealth: Payer: Self-pay

## 2023-11-27 ENCOUNTER — Encounter: Payer: Self-pay | Admitting: Physician Assistant

## 2023-11-27 ENCOUNTER — Ambulatory Visit (INDEPENDENT_AMBULATORY_CARE_PROVIDER_SITE_OTHER): Admitting: Physician Assistant

## 2023-11-27 VITALS — BP 106/70 | HR 74 | Ht 68.0 in | Wt 177.0 lb

## 2023-11-27 DIAGNOSIS — Z1231 Encounter for screening mammogram for malignant neoplasm of breast: Secondary | ICD-10-CM

## 2023-11-27 DIAGNOSIS — R590 Localized enlarged lymph nodes: Secondary | ICD-10-CM

## 2023-11-27 DIAGNOSIS — F840 Autistic disorder: Secondary | ICD-10-CM

## 2023-11-27 DIAGNOSIS — H1013 Acute atopic conjunctivitis, bilateral: Secondary | ICD-10-CM

## 2023-11-27 DIAGNOSIS — E7800 Pure hypercholesterolemia, unspecified: Secondary | ICD-10-CM

## 2023-11-27 DIAGNOSIS — L709 Acne, unspecified: Secondary | ICD-10-CM

## 2023-11-27 DIAGNOSIS — J3089 Other allergic rhinitis: Secondary | ICD-10-CM | POA: Diagnosis not present

## 2023-11-27 MED ORDER — AZELASTINE HCL 0.05 % OP SOLN: 2.0000 [drp] | Freq: Two times a day (BID) | OPHTHALMIC | 12 refills | Status: AC

## 2023-11-27 MED ORDER — LEVOCETIRIZINE DIHYDROCHLORIDE 5 MG PO TABS: 5.0000 mg | ORAL_TABLET | Freq: Every evening | ORAL | 2 refills | Status: DC

## 2023-11-27 NOTE — Telephone Encounter (Signed)
 Joanna Robinson

## 2023-11-27 NOTE — Progress Notes (Signed)
 Established patient visit  Patient: Joanna Robinson   DOB: 1980/05/27   43 y.o. Female  MRN: [id number] Visit Date: 11/27/2023  Today's healthcare provider: Jolynn Spencer, PA-C   Chief Complaint  Patient presents with   Follow-up    Patient is present to follow up on visit from 11/18/23. Mother also reports nasal medication provided by office did not work for patient but the one provided by specialty did and pharmacy is reporting they need another rx. Patients mother would also like to know of lab results from 08/28   Subjective     HPI     Follow-up    Additional comments: Patient is present to follow up on visit from 11/18/23. Mother also reports nasal medication provided by office did not work for patient but the one provided by specialty did and pharmacy is reporting they need another rx. Patients mother would also like to know of lab results from 08/28      Last edited by Lilian Fitzpatrick, CMA on 11/27/2023  2:27 PM.       Discussed the use of AI scribe software for clinical note transcription with the patient, who gave verbal consent to proceed.  History of Present Illness Joanna Robinson is a 43 year old female who presents with nasal congestion and sinus issues. She is accompanied by a caregiver.  Joanna Robinson experiences ongoing nasal congestion and sinus problems. She uses two nasal sprays, including azelastine , but finds them ineffective. She previously visited an ENT specialist who prescribed a nasal spray, but she cannot recall the name and the pharmacy has no record of it.  She has seasonal allergies, particularly in the spring, which exacerbate her symptoms. Despite taking Zyrtec  daily for a couple of months, her symptoms persist.  She experiences eye discomfort and uses azelastine  ophthalmic solution. She is unsure if she needs a new prescription for this medication.  There have been no recent changes in her symptoms despite ongoing treatment.  The  10-year ASCVD risk score (Arnett DK, et al., 2019) is: 0.1%      10/21/2023    9:30 AM 03/31/2023    2:34 PM 10/20/2022    9:27 AM  Depression screen PHQ 2/9  Decreased Interest 0 0 0  Down, Depressed, Hopeless 0 0 0  PHQ - 2 Score 0 0 0  Altered sleeping 0    Tired, decreased energy 0    Change in appetite 0    Feeling bad or failure about yourself  0    Trouble concentrating 0    Moving slowly or fidgety/restless 0    Suicidal thoughts 0    PHQ-9 Score 0    Difficult doing work/chores Not difficult at all        03/31/2023    2:34 PM  GAD 7 : Generalized Anxiety Score  Nervous, Anxious, on Edge 0  Control/stop worrying 0  Worry too much - different things 0  Trouble relaxing 0  Restless 0  Easily annoyed or irritable 0  Afraid - awful might happen 0  Total GAD 7 Score 0  Anxiety Difficulty Not difficult at all    Medications: Outpatient Medications Prior to Visit  Medication Sig   acetaZOLAMIDE  (DIAMOX ) 250 MG tablet TAKE 1 TABLET BY MOUTH TWICE DAILY   azelastine  (ASTELIN ) 0.1 % nasal spray PLACE 1 SPRAY INTO BOTH NOSTRILS TWICE DAILY.   baclofen  (LIORESAL ) 10 MG tablet TAKE 1 TABLET BY MOUTH TWICE DAILY   benzoyl peroxide  5 %  gel Apply topically 2 (two) times daily. To affected area   benzoyl peroxide -erythromycin  (BENZAMYCIN) gel APPLY TO AFFECTED AREA(s) TWICE DAILY ASDIRECTED   brompheniramine-pseudoephedrine-DM 30-2-10 MG/5ML syrup Take 5 mLs by mouth 4 (four) times daily as needed.   carbamazepine  (CARBATROL ) 200 MG 12 hr capsule TAKE 1 CAPSULE BY MOUTH ONCE EVERY MORNING AND 1 CAPSULE ONCE EVERY EVENINGAS DIRECTED DOSE DECREASE*   cetirizine  (ZYRTEC ) 10 MG tablet Take 1 tablet (10 mg total) by mouth daily.   cloNIDine  (CATAPRES ) 0.2 MG tablet TAKE 1 TABLET BY MOUTH AT BEDTIME   ferrous gluconate  (FERGON) 324 MG tablet TAKE 1 TABLET BY MOUTH TWICE DAILY WITH MEALS (Patient taking differently: TAKES ONCE PER DAY)   Fluocinolone Acetonide Scalp 0.01 % OIL APPLY TO  AFFECTED AREA(s) AS DIRECTED UPTO 1-2 TIMES A DAY   fluticasone  (FLONASE ) 50 MCG/ACT nasal spray Place into both nostrils.   fluvoxaMINE  (LUVOX ) 100 MG tablet TAKE 1 TABLET BY MOUTH TWICE DAILY   hydrochlorothiazide  (HYDRODIURIL ) 25 MG tablet TAKE 1 TABLET BY MOUTH ONCE DAILY   LINZESS  145 MCG CAPS capsule TAKE 1 CAPSULE BY MOUTH ONCE DAILY   MAPAP 500 MG tablet TAKE 2 TABLETS BY MOUTH 3 TIMES DAILY ASNEEDED HEADACHE   montelukast  (SINGULAIR ) 10 MG tablet Take 1 tablet (10 mg total) by mouth at bedtime.   naproxen  (NAPROSYN ) 500 MG tablet TAKE 1 TABLET BY MOUTH TWICE DAILY   omeprazole  (PRILOSEC) 40 MG capsule TAKE 1 CAPSULE BY MOUTH ONCE DAILY   potassium chloride  (KLOR-CON ) 20 MEQ packet MIX AND DISSOLVE 1 PACKET IN 4OZ OF LIQUID AND DRINK ONCE DAILY   saline (AYR) GEL Place 1 Application into both nostrils every 4 (four) hours as needed.   sodium fluoride  (DENTA 5000 PLUS) 1.1 % CREA dental cream Place 1 application onto teeth every evening. Brush teeth daily for 1 minute, rinse and spit.   sulfacetamide  (BLEPH -10) 10 % ophthalmic solution PLACE 2 DROPS IN RIGHT EYE 4 TIMES DAILY (Patient taking differently: PLACE 2 DROPS IN RIGHT EYE 4 TIMES DAILY TAKES PRN)   triamcinolone  cream (KENALOG ) 0.1 % APPLY TO AFFECTED AREA(s) TWICE DAILY ASDIRECTED   [DISCONTINUED] azelastine  (OPTIVAR ) 0.05 % ophthalmic solution PLACE 1 DROP IN BOTH EYES TWICE DAILY   No facility-administered medications prior to visit.    Review of Systems All negative Except see HPI       Objective    BP 106/70   Pulse 74   Ht 5' 8 (1.727 m)   Wt 177 lb (80.3 kg)   LMP 11/12/2023 (Exact Date)   SpO2 100%   BMI 26.91 kg/m     Physical Exam Vitals reviewed.  Constitutional:      Appearance: She is normal weight.  HENT:     Head: Normocephalic and atraumatic.     Right Ear: Ear canal and external ear normal.     Left Ear: Ear canal and external ear normal.     Nose: Congestion and rhinorrhea present.      Mouth/Throat:     Pharynx: No posterior oropharyngeal erythema.     Comments: Postnasal drainage noted Eyes:     General: No scleral icterus.       Right eye: No discharge.        Left eye: No discharge.     Extraocular Movements: Extraocular movements intact.     Pupils: Pupils are equal, round, and reactive to light.  Cardiovascular:     Rate and Rhythm: Normal rate and regular rhythm.  Pulmonary:     Effort: Pulmonary effort is normal.     Breath sounds: Normal breath sounds.  Abdominal:     General: Abdomen is flat. Bowel sounds are normal.     Palpations: Abdomen is soft.  Lymphadenopathy:     Cervical: Cervical adenopathy present.  Neurological:     Mental Status: She is alert.      No results found for any visits on 11/27/23.      Assessment & Plan Breast cancer screening with mammogram Bradycardia discussed the importance of regular screenings and maintaining a healthy lifestyle. - Advise to schedule a mammogram.  Allergic rhinitis Persistent allergic rhinitis with nasal congestion and discomfort. Current treatment with Zyrtec  is ineffective. Flonase  is avoided due to a previous reaction. - Prescribe temporary alternative antihistamine for 2-3 months. - Continue Zyrtec  as needed. - Use nasal saline spray before azelastine  to clear nasal passages. - Prescribe azelastine  nasal spray for use twice daily.  Acute atopic conjunctivitis, bilateral Bilateral eye redness, discharge, and itching consistent with acute atopic conjunctivitis. Current treatment with azelastine  ophthalmic solution is appropriate. - Prescribe azelastine  ophthalmic solution for eye symptoms.  Cervical lymphadenopathy Enlarged cervical lymph nodes likely related to ongoing allergic rhinitis and sinus issues. - No immediate intervention required unless symptoms persist or worsen.  Pure hypercholesterolemia Elevated cholesterol levels. - Recommend low cholesterol diet and regular exercise,  including bike riding. The rest of blood pressure results from 11/18/2023 were wnl  Encounter for screening mammogram for malignant neoplasm of breast (Primary)  - MM 3D SCREENING MAMMOGRAM BILATERAL BREAST; Future  Seasonal allergic rhinitis due to other allergic trigger  - levocetirizine (XYZAL ) 5 MG tablet; Take 1 tablet (5 mg total) by mouth every evening.  Dispense: 30 tablet; Refill: 2  Allergic conjunctivitis of both eyes  - azelastine  (OPTIVAR ) 0.05 % ophthalmic solution; Place 2 drops into both eyes 2 (two) times daily. PLACE 1 DROP IN BOTH EYES TWICE DAILY  Dispense: 6 mL; Refill: 12  Active autistic disorder Chronic and stable Hx of partial seizures, tic-like abnormal movements Continue carbatrol  and diamox  Under contact supervision and care Will follow-up  Orders Placed This Encounter  Procedures   MM 3D SCREENING MAMMOGRAM BILATERAL BREAST    Standing Status:   Future    Expiration Date:   01/26/2025    Reason for Exam (SYMPTOM  OR DIAGNOSIS REQUIRED):   screening    Is the patient pregnant?:   No    Preferred imaging location?:   [facility]    No follow-ups on file.   The patient was advised to call back or seek an in-person evaluation if the symptoms worsen or if the condition fails to improve as anticipated.  I discussed the assessment and treatment plan with the patient. The patient was provided an opportunity to ask questions and all were answered. The patient agreed with the plan and demonstrated an understanding of the instructions.  I, Emmamae Mcnamara, PA-C have reviewed all documentation for this visit. The documentation on 11/27/2023  for the exam, diagnosis, procedures, and orders are all accurate and complete.  Jolynn Spencer, [facility], MMS [facility] [phone] (phone) [phone] (fax)  [facility]

## 2023-11-27 NOTE — Patient Instructions (Signed)

## 2023-12-03 ENCOUNTER — Other Ambulatory Visit: Payer: Self-pay | Admitting: Family Medicine

## 2023-12-03 ENCOUNTER — Telehealth: Payer: Self-pay | Admitting: Family Medicine

## 2023-12-03 DIAGNOSIS — M542 Cervicalgia: Secondary | ICD-10-CM

## 2023-12-03 MED ORDER — FLUTICASONE PROPIONATE 50 MCG/ACT NA SUSP: 2.0000 | Freq: Every day | NASAL | 11 refills | Status: AC

## 2023-12-03 NOTE — Telephone Encounter (Signed)
 Patient's mother requesting imaging due to ongoing complaint of neck pain by patient which is very abnormal for her. Patient has limited ability to communicate her needs. Ordered x-ray for further evaluation as noted. Discussed possible ortho referral if needed.

## 2023-12-04 ENCOUNTER — Ambulatory Visit
Admission: RE | Admit: 2023-12-04 | Discharge: 2023-12-04 | Disposition: A | Discharge status: Home / Self Care | Attending: Family Medicine | Admitting: Family Medicine

## 2023-12-04 ENCOUNTER — Ambulatory Visit
Admission: RE | Admit: 2023-12-04 | Discharge: 2023-12-04 | Disposition: A | Discharge status: Home / Self Care | Source: Ambulatory Visit | Attending: Family Medicine | Admitting: Family Medicine

## 2023-12-04 DIAGNOSIS — M542 Cervicalgia: Secondary | ICD-10-CM | POA: Insufficient documentation

## 2023-12-04 DIAGNOSIS — R070 Pain in throat: Secondary | ICD-10-CM | POA: Diagnosis not present

## 2023-12-14 ENCOUNTER — Ambulatory Visit: Payer: Self-pay | Admitting: Family Medicine

## 2023-12-15 ENCOUNTER — Ambulatory Visit

## 2023-12-15 ENCOUNTER — Telehealth: Payer: Self-pay

## 2023-12-15 DIAGNOSIS — M542 Cervicalgia: Secondary | ICD-10-CM

## 2023-12-15 NOTE — Telephone Encounter (Signed)
 Copied from CRM #[id number]. Topic: Referral - Request for Referral >> Dec 14, 2023  3:16 PM Emylou G wrote: Did the patient discuss referral with their provider in the last year? Yes (If No - schedule appointment) (If Yes - send message)  Appointment offered? No  Type of order/referral and detailed reason for visit: neck pain specialist - despite results her daughter is in pain   Preference of office, provider, location: unsure  If referral order, have you been seen by this specialty before? No (If Yes, this issue or another issue? When? Where?  Can we respond through MyChart? No

## 2023-12-16 NOTE — Addendum Note (Signed)
 Addended by: DONZELLA DOMINO on: 12/16/2023 04:50 PM   Modules accepted: Orders

## 2023-12-22 ENCOUNTER — Ambulatory Visit

## 2023-12-22 DIAGNOSIS — M47812 Spondylosis without myelopathy or radiculopathy, cervical region: Secondary | ICD-10-CM | POA: Diagnosis not present

## 2023-12-22 DIAGNOSIS — S161XXA Strain of muscle, fascia and tendon at neck level, initial encounter: Secondary | ICD-10-CM | POA: Diagnosis not present

## 2024-01-01 DIAGNOSIS — M542 Cervicalgia: Secondary | ICD-10-CM | POA: Diagnosis not present

## 2024-01-02 ENCOUNTER — Other Ambulatory Visit: Payer: Self-pay | Admitting: Family Medicine

## 2024-01-02 DIAGNOSIS — R0981 Nasal congestion: Secondary | ICD-10-CM

## 2024-01-05 ENCOUNTER — Ambulatory Visit
Admission: RE | Admit: 2024-01-05 | Discharge: 2024-01-05 | Disposition: A | Discharge status: Home / Self Care | Source: Ambulatory Visit | Attending: Physician Assistant | Admitting: Physician Assistant

## 2024-01-05 ENCOUNTER — Telehealth (INDEPENDENT_AMBULATORY_CARE_PROVIDER_SITE_OTHER): Payer: Self-pay | Admitting: Family

## 2024-01-05 DIAGNOSIS — Z1231 Encounter for screening mammogram for malignant neoplasm of breast: Secondary | ICD-10-CM | POA: Diagnosis not present

## 2024-01-05 NOTE — Telephone Encounter (Signed)
 Contacted patients mother.  Verified patients name and DOB as well as mothers name.   Mom stated that Joanna Robinson has been experiencing neck pain for quite some time and she wanted to cover her basis from every angle.   [name] had an Xray about a week & a half ago with Ortho. They didn't find anything. The provider thought that the pain may be due to  be arthritis, but they aren't sure. Mom stated that the provider saw a space in the neck bone, because of this they referred her to a specialist and has an appointment to see the specialist on 31st, Dr. Guinevere in [facility]  [name] has been attending PT since last week.  Informed mom that I would relay this message to Mrs. Joanna Robinson who will determine if an earlier appointment is needed.  Mom verbalized understanding of this.  SS, CCMA

## 2024-01-05 NOTE — Telephone Encounter (Signed)
 I left a message for Mom and will call her again tomorrow.

## 2024-01-05 NOTE — Telephone Encounter (Signed)
 New message    Mom calling C/o neck pain asking for a call back to discuss an sooner appt with Dr. Marianna.

## 2024-01-06 NOTE — Telephone Encounter (Signed)
 I called and spoke with Mom. She said that Modesty intermittently says pain and grabs the back of her neck. She saw her PCP who did x-rays that were negative. She saw orthopedics who prescribed physical therapy. She has appointment at [facility] later this month with a physical medicine provider for evaluation as well. Mom is worried that there is something wrong with her that is yet undiagnosed. I told Mom that with the information given that the pain could be muscular in nature, and that muscle pain can be quite painful. We talked about Miracle mimicking words she has heard but Mom doesn't feel that she is doing that. I asked Mom to ask the provider she sees later this month to send me a report of the evaluation there. I explained to Mom that further evaluation such as MRI would require sedation. Mom agreed with the plans made today.

## 2024-01-08 ENCOUNTER — Ambulatory Visit: Payer: Self-pay | Admitting: Physician Assistant

## 2024-01-11 DIAGNOSIS — M542 Cervicalgia: Secondary | ICD-10-CM | POA: Diagnosis not present

## 2024-01-20 DIAGNOSIS — M542 Cervicalgia: Secondary | ICD-10-CM | POA: Diagnosis not present

## 2024-01-25 DIAGNOSIS — M542 Cervicalgia: Secondary | ICD-10-CM | POA: Diagnosis not present

## 2024-02-01 NOTE — Progress Notes (Signed)
 " Established patient visit  Patient: Joanna Robinson   DOB: 1980-06-12   43 y.o. Female  MRN: [id number] Visit Date: 02/02/2024  Today's healthcare provider: Jolynn Spencer, PA-C   No chief complaint on file.  Subjective       Discussed the use of AI scribe software for clinical note transcription with the patient, who gave verbal consent to proceed.  History of Present Illness ANNIEBELL BEDORE is a 43 year old female who presents with nasal congestion and sore throat.  She experiences significant nasal congestion, particularly in one nostril, which interferes with sleep. Despite using azelastine  and Flonase , there is no relief. She is not using over-the-counter nasal decongestants or nasal saline rinse. A recent episode of conjunctivitis prompted the appointment. The sore throat is managed with symptomatic treatments at home. There is no fever.       10/21/2023    9:30 AM 03/31/2023    2:34 PM 10/20/2022    9:27 AM  Depression screen PHQ 2/9  Decreased Interest 0 0 0  Down, Depressed, Hopeless 0 0 0  PHQ - 2 Score 0 0 0  Altered sleeping 0    Tired, decreased energy 0    Change in appetite 0    Feeling bad or failure about yourself  0    Trouble concentrating 0    Moving slowly or fidgety/restless 0    Suicidal thoughts 0    PHQ-9 Score 0     Difficult doing work/chores Not difficult at all       Data saved with a previous flowsheet row definition      03/31/2023    2:34 PM  GAD 7 : Generalized Anxiety Score  Nervous, Anxious, on Edge 0  Control/stop worrying 0  Worry too much - different things 0  Trouble relaxing 0  Restless 0  Easily annoyed or irritable 0  Afraid - awful might happen 0  Total GAD 7 Score 0  Anxiety Difficulty Not difficult at all    Medications: Outpatient Medications Prior to Visit  Medication Sig   acetaZOLAMIDE  (DIAMOX ) 250 MG tablet TAKE 1 TABLET BY MOUTH TWICE DAILY   azelastine  (ASTELIN ) 0.1 % nasal spray PLACE 1 SPRAY INTO BOTH  NOSTRILS TWICE DAILY.   azelastine  (OPTIVAR ) 0.05 % ophthalmic solution Place 2 drops into both eyes 2 (two) times daily. PLACE 1 DROP IN BOTH EYES TWICE DAILY   baclofen  (LIORESAL ) 10 MG tablet TAKE 1 TABLET BY MOUTH TWICE DAILY   benzoyl peroxide  5 % gel Apply topically 2 (two) times daily. To affected area   benzoyl peroxide -erythromycin  (BENZAMYCIN) gel APPLY TO AFFECTED AREA(s) TWICE DAILY ASDIRECTED   brompheniramine-pseudoephedrine-DM 30-2-10 MG/5ML syrup Take 5 mLs by mouth 4 (four) times daily as needed.   carbamazepine  (CARBATROL ) 200 MG 12 hr capsule TAKE 1 CAPSULE BY MOUTH ONCE EVERY MORNING AND 1 CAPSULE ONCE EVERY EVENINGAS DIRECTED DOSE DECREASE*   cetirizine  (ZYRTEC ) 10 MG tablet Take 1 tablet (10 mg total) by mouth daily.   cloNIDine  (CATAPRES ) 0.2 MG tablet TAKE 1 TABLET BY MOUTH AT BEDTIME   Fluocinolone Acetonide Scalp 0.01 % OIL APPLY TO AFFECTED AREA(s) AS DIRECTED UPTO 1-2 TIMES A DAY   fluticasone  (FLONASE ) 50 MCG/ACT nasal spray Place 2 sprays into both nostrils daily.   fluvoxaMINE  (LUVOX ) 100 MG tablet TAKE 1 TABLET BY MOUTH TWICE DAILY   hydrochlorothiazide  (HYDRODIURIL ) 25 MG tablet TAKE 1 TABLET BY MOUTH ONCE DAILY   levocetirizine (XYZAL ) 5 MG tablet Take 1 tablet (  5 mg total) by mouth every evening.   LINZESS  145 MCG CAPS capsule TAKE 1 CAPSULE BY MOUTH ONCE DAILY   MAPAP 500 MG tablet TAKE 2 TABLETS BY MOUTH 3 TIMES DAILY ASNEEDED HEADACHE   montelukast  (SINGULAIR ) 10 MG tablet Take 1 tablet (10 mg total) by mouth at bedtime.   naproxen  (NAPROSYN ) 500 MG tablet TAKE 1 TABLET BY MOUTH TWICE DAILY   omeprazole  (PRILOSEC) 40 MG capsule TAKE 1 CAPSULE BY MOUTH ONCE DAILY   potassium chloride  (KLOR-CON ) 20 MEQ packet MIX AND DISSOLVE 1 PACKET IN 4OZ OF LIQUID AND DRINK ONCE DAILY   saline (AYR) GEL Place 1 Application into both nostrils every 4 (four) hours as needed.   sodium fluoride  (DENTA 5000 PLUS) 1.1 % CREA dental cream Place 1 application onto teeth every  evening. Brush teeth daily for 1 minute, rinse and spit.   sulfacetamide  (BLEPH -10) 10 % ophthalmic solution PLACE 2 DROPS IN RIGHT EYE 4 TIMES DAILY (Patient taking differently: PLACE 2 DROPS IN RIGHT EYE 4 TIMES DAILY TAKES PRN)   triamcinolone  cream (KENALOG ) 0.1 % APPLY TO AFFECTED AREA(s) TWICE DAILY ASDIRECTED   No facility-administered medications prior to visit.    Review of Systems  All other systems reviewed and are negative.  All negative Except see HPI       Objective    LMP 01/04/2024     Physical Exam Vitals reviewed.  Constitutional:      Appearance: She is normal weight.  HENT:     Head: Normocephalic and atraumatic.     Right Ear: Ear canal and external ear normal.     Left Ear: Ear canal and external ear normal.     Nose: Congestion and rhinorrhea present.     Mouth/Throat:     Pharynx: Posterior oropharyngeal erythema present.     Comments: Postnasal drainage noted Eyes:     General: No scleral icterus.       Right eye: No discharge.        Left eye: No discharge.     Extraocular Movements: Extraocular movements intact.     Pupils: Pupils are equal, round, and reactive to light.  Cardiovascular:     Rate and Rhythm: Normal rate and regular rhythm.  Pulmonary:     Effort: Pulmonary effort is normal.     Breath sounds: Normal breath sounds.  Abdominal:     General: Abdomen is flat. Bowel sounds are normal.     Palpations: Abdomen is soft.  Lymphadenopathy:     Cervical: No cervical adenopathy.  Neurological:     Mental Status: She is alert.     No results found for any visits on 02/02/24.    Assessment & Plan Chronic sinusitis and suspected viral upper respiratory infection Acute on chronic sinusitis with nasal congestion causing discomfort and sleep disturbance. Differential includes viral upper respiratory infection. No immediate need for antibiotics. Advised to use azelastine  eye drops - Continue nasal saline rinse and azelastine . -  Provided nasal saline rinse samples. - Advised use of YouTube video for nasal rinse instructions. - Encouraged hydration and warm salt gargles. - Monitor symptoms; consider antibiotics if no improvement for the next 2-3 days   Other chronic sinusitis (Primary) - amoxicillin -clavulanate (AUGMENTIN ) 875-125 MG tablet; Take 1 tablet by mouth 2 (two) times daily.  Dispense: 20 tablet; Refill: 0   No orders of the defined types were placed in this encounter.   No follow-ups on file.   The patient was advised to  call back or seek an in-person evaluation if the symptoms worsen or if the condition fails to improve as anticipated.  I discussed the assessment and treatment plan with the patient. The patient was provided an opportunity to ask questions and all were answered. The patient agreed with the plan and demonstrated an understanding of the instructions.  I, Delmer Kowalski, PA-C have reviewed all documentation for this visit. The documentation on 02/02/2024  for the exam, diagnosis, procedures, and orders are all accurate and complete.  Jolynn Spencer, [facility], MMS [facility] [phone] (phone) [phone] (fax)  [facility] "

## 2024-02-02 ENCOUNTER — Ambulatory Visit: Admitting: Physician Assistant

## 2024-02-02 ENCOUNTER — Encounter: Payer: Self-pay | Admitting: Physician Assistant

## 2024-02-02 VITALS — BP 108/85 | HR 75 | Resp 14 | Ht 68.0 in | Wt 165.0 lb

## 2024-02-02 DIAGNOSIS — J328 Other chronic sinusitis: Secondary | ICD-10-CM | POA: Diagnosis not present

## 2024-02-02 MED ORDER — AMOXICILLIN-POT CLAVULANATE 875-125 MG PO TABS: 1.0000 | ORAL_TABLET | Freq: Two times a day (BID) | ORAL | 0 refills | Status: DC

## 2024-02-04 MED ORDER — SINUS RINSE BOTTLE KIT NA PACK: 1.0000 | PACK | Freq: Every day | NASAL | Status: AC

## 2024-02-04 NOTE — Addendum Note (Signed)
 Addended by: WILFRED HARGIS RAMAN on: 02/04/2024 11:19 AM   Modules accepted: Orders

## 2024-02-05 DIAGNOSIS — S161XXA Strain of muscle, fascia and tendon at neck level, initial encounter: Secondary | ICD-10-CM | POA: Diagnosis not present

## 2024-02-05 DIAGNOSIS — M542 Cervicalgia: Secondary | ICD-10-CM | POA: Diagnosis not present

## 2024-02-05 DIAGNOSIS — M47812 Spondylosis without myelopathy or radiculopathy, cervical region: Secondary | ICD-10-CM | POA: Diagnosis not present

## 2024-02-05 DIAGNOSIS — M791 Myalgia, unspecified site: Secondary | ICD-10-CM | POA: Diagnosis not present

## 2024-02-15 ENCOUNTER — Other Ambulatory Visit (INDEPENDENT_AMBULATORY_CARE_PROVIDER_SITE_OTHER): Payer: Self-pay | Admitting: Family

## 2024-02-15 ENCOUNTER — Other Ambulatory Visit: Payer: Self-pay | Admitting: Family Medicine

## 2024-02-15 ENCOUNTER — Other Ambulatory Visit: Payer: Self-pay | Admitting: Physician Assistant

## 2024-02-15 DIAGNOSIS — J3089 Other allergic rhinitis: Secondary | ICD-10-CM

## 2024-02-15 DIAGNOSIS — G40209 Localization-related (focal) (partial) symptomatic epilepsy and epileptic syndromes with complex partial seizures, not intractable, without status epilepticus: Secondary | ICD-10-CM

## 2024-02-15 DIAGNOSIS — R059 Cough, unspecified: Secondary | ICD-10-CM

## 2024-02-15 NOTE — Telephone Encounter (Signed)
 Copied from CRM #[id number]. Topic: Clinical - Medication Refill >> Feb 15, 2024  4:34 PM Delon HERO wrote: Medication:brompheniramine-pseudoephedrine-DM 30-2-10 MG/5ML syrup [[id number]]  Has the patient contacted their pharmacy? Yes (Agent: If no, request that the patient contact the pharmacy for the refill. If patient does not wish to contact the pharmacy document the reason why and proceed with request.) (Agent: If yes, when and what did the pharmacy advise?)  This is the patient's preferred pharmacy:  [facility] - [place], [place] - [address] [place] [address] Phone: [phone] Fax: [phone]  Is this the correct pharmacy for this prescription? Yes If no, delete pharmacy and type the correct one.   Has the prescription been filled recently? Yes  Is the patient out of the medication? Yes  Has the patient been seen for an appointment in the last year OR does the patient have an upcoming appointment? Yes  Can we respond through MyChart? Yes  Agent: Please be advised that Rx refills may take up to 3 business days. We ask that you follow-up with your pharmacy.

## 2024-02-16 DIAGNOSIS — M542 Cervicalgia: Secondary | ICD-10-CM | POA: Diagnosis not present

## 2024-02-16 NOTE — Telephone Encounter (Signed)
 Requested medication (s) are due for refill today - unsure  Requested medication (s) are on the active medication list -yes  Future visit scheduled -no  Last refill: 05/18/23 1RF  Notes to clinic: Acute appointment/symptom Rx- sent for request refill  Requested Prescriptions  Pending Prescriptions Disp Refills   brompheniramine-pseudoephedrine-DM 30-2-10 MG/5ML syrup 120 mL 1    Sig: Take 5 mLs by mouth 4 (four) times daily as needed.     Not Delegated - Ear, Nose, and Throat:  Combination Products with Pseudoephedrine Failed - 02/16/2024  3:41 PM      Failed - This refill cannot be delegated      Passed - Last BP in normal range    BP Readings from Last 1 Encounters:  02/02/24 108/85         Passed - Valid encounter within last 12 months    Recent Outpatient Visits           2 weeks ago Other chronic sinusitis   [place] [facility] [place], [place], PA-C   2 months ago Seasonal allergic rhinitis due to other allergic trigger   [place] [facility] [place], [place], PA-C   3 months ago Stiffness of neck   [place] [facility] [place], [place], [place]   7 months ago Rectal bleeding   [place] [facility] [place], [place], PA-C   9 months ago Upper respiratory infection, viral   [place] [place], Lauraine SAILOR, DO                 Requested Prescriptions  Pending Prescriptions Disp Refills   brompheniramine-pseudoephedrine-DM 30-2-10 MG/5ML syrup 120 mL 1    Sig: Take 5 mLs by mouth 4 (four) times daily as needed.     Not Delegated - Ear, Nose, and Throat:  Combination Products with Pseudoephedrine Failed - 02/16/2024  3:41 PM      Failed - This refill cannot be delegated      Passed - Last BP in normal range    BP Readings from Last 1 Encounters:  02/02/24 108/85         Passed - Valid encounter within last 12 months    Recent Outpatient Visits           2 weeks  ago Other chronic sinusitis   [place] [facility] [place], [place], PA-C   2 months ago Seasonal allergic rhinitis due to other allergic trigger   [place] [facility] [place], [place], PA-C   3 months ago Stiffness of neck   [place] [facility] [place], [place], [place]   7 months ago Rectal bleeding   [place] [facility] [place], [place], PA-C   9 months ago Upper respiratory infection, viral   [facility] [place], Lauraine SAILOR, DO

## 2024-02-17 MED ORDER — PSEUDOEPH-BROMPHEN-DM 30-2-10 MG/5ML PO SYRP: 5.0000 mL | ORAL_SOLUTION | Freq: Four times a day (QID) | ORAL | 1 refills | Status: AC | PRN

## 2024-03-04 DIAGNOSIS — M542 Cervicalgia: Secondary | ICD-10-CM | POA: Diagnosis not present

## 2024-03-10 ENCOUNTER — Other Ambulatory Visit: Payer: Self-pay | Admitting: Family Medicine

## 2024-03-10 DIAGNOSIS — N946 Dysmenorrhea, unspecified: Secondary | ICD-10-CM

## 2024-03-11 ENCOUNTER — Other Ambulatory Visit: Payer: Self-pay | Admitting: Family Medicine

## 2024-03-11 DIAGNOSIS — N946 Dysmenorrhea, unspecified: Secondary | ICD-10-CM

## 2024-03-11 NOTE — Telephone Encounter (Signed)
 Copied from CRM #[id number]. Topic: Clinical - Medication Refill >> Mar 11, 2024  3:09 PM Rosaria E wrote: Medication:  naproxen  (NAPROSYN ) 500 MG tablet   fluvoxaMINE  (LUVOX ) 100 MG tablet  Has the patient contacted their pharmacy? Yes (Agent: If no, request that the patient contact the pharmacy for the refill. If patient does not wish to contact the pharmacy document the reason why and proceed with request.) (Agent: If yes, when and what did the pharmacy advise?)  This is the patient's preferred pharmacy:  [facility] - [place], [place] - [address] [place] [address] Phone: [phone] Fax: [phone]  Is this the correct pharmacy for this prescription? Yes If no, delete pharmacy and type the correct one.   Has the prescription been filled recently? Yes  Is the patient out of the medication? Yes  Has the patient been seen for an appointment in the last year OR does the patient have an upcoming appointment? Yes  Can we respond through MyChart? Yes  Agent: Please be advised that Rx refills may take up to 3 business days. We ask that you follow-up with your pharmacy.

## 2024-03-15 NOTE — Telephone Encounter (Signed)
 Medications refilled 03/14/24. Requested Prescriptions  Refused Prescriptions Disp Refills   naproxen  (NAPROSYN ) 500 MG tablet 180 tablet 1    Sig: Take 1 tablet (500 mg total) by mouth 2 (two) times daily.     Analgesics:  NSAIDS Failed - 03/15/2024  3:21 PM      Failed - Manual Review: Labs are only required if the patient has taken medication for more than 8 weeks.      Passed - Cr in normal range and within 360 days    Creatinine, Ser  Date Value Ref Range Status  11/18/2023 0.78 0.57 - 1.00 mg/dL Final         Passed - HGB in normal range and within 360 days    Hemoglobin  Date Value Ref Range Status  11/18/2023 12.6 11.1 - 15.9 g/dL Final         Passed - PLT in normal range and within 360 days    Platelets  Date Value Ref Range Status  11/18/2023 190 150 - 450 x10E3/uL Final         Passed - HCT in normal range and within 360 days    Hematocrit  Date Value Ref Range Status  11/18/2023 40.2 34.0 - 46.6 % Final         Passed - eGFR is 30 or above and within 360 days    GFR calc Af Amer  Date Value Ref Range Status  05/16/2020 124 >59 mL/min/1.73 Final    Comment:    **In accordance with recommendations from the NKF-ASN Task force,**   [facility] is in the process of updating its eGFR calculation to the   2021 CKD-EPI creatinine equation that estimates kidney function   without a race variable.    GFR calc non Af Amer  Date Value Ref Range Status  05/16/2020 107 >59 mL/min/1.73 Final   eGFR  Date Value Ref Range Status  11/18/2023 98 >59 mL/min/1.73 Final         Passed - Patient is not pregnant      Passed - Valid encounter within last 12 months    Recent Outpatient Visits           1 month ago Other chronic sinusitis   [place] [facility] [place], [place], PA-C   3 months ago Seasonal allergic rhinitis due to other allergic trigger   [place] [facility] [place], [place], PA-C   3 months ago Stiffness of neck   [facility] [place], [place], PA-C   8 months ago Rectal bleeding   [place] [facility] [place], [place], PA-C   10 months ago Upper respiratory infection, viral   [place] [facility] [place], Lauraine SAILOR, DO               fluvoxaMINE  (LUVOX ) 100 MG tablet 180 tablet 1    Sig: Take 1 tablet (100 mg total) by mouth 2 (two) times daily.     Psychiatry:  Antidepressants - SSRI Passed - 03/15/2024  3:21 PM      Passed - Valid encounter within last 6 months    Recent Outpatient Visits           1 month ago Other chronic sinusitis   [place] [facility] [place], [place], PA-C   3 months ago Seasonal allergic rhinitis due to other allergic trigger    [facility] [place], [place], PA-C   3 months ago Stiffness of  neck   [place] [facility] [place], [place], [place]   8 months ago Rectal bleeding   [place] [facility] [place], [place], [place]   10 months ago Upper respiratory infection, viral   [facility] [place], Lauraine SAILOR, [place]

## 2024-03-16 ENCOUNTER — Other Ambulatory Visit: Payer: Self-pay | Admitting: Family Medicine

## 2024-04-05 ENCOUNTER — Encounter: Admitting: Physician Assistant

## 2024-04-11 ENCOUNTER — Other Ambulatory Visit: Payer: Self-pay | Admitting: Family Medicine

## 2024-04-11 DIAGNOSIS — N946 Dysmenorrhea, unspecified: Secondary | ICD-10-CM

## 2024-04-11 NOTE — Telephone Encounter (Signed)
 Last Visit: 05/18/2023 Next Visit: 04/25/2024 Last Refill: 03/14/24 #[id number]  Please Advise

## 2024-04-13 ENCOUNTER — Other Ambulatory Visit: Payer: Self-pay | Admitting: Family Medicine

## 2024-04-13 DIAGNOSIS — G479 Sleep disorder, unspecified: Secondary | ICD-10-CM

## 2024-04-25 ENCOUNTER — Ambulatory Visit: Admitting: Family Medicine

## 2024-04-25 DIAGNOSIS — G40209 Localization-related (focal) (partial) symptomatic epilepsy and epileptic syndromes with complex partial seizures, not intractable, without status epilepticus: Secondary | ICD-10-CM

## 2024-04-25 DIAGNOSIS — Z79899 Other long term (current) drug therapy: Secondary | ICD-10-CM

## 2024-04-25 DIAGNOSIS — D702 Other drug-induced agranulocytosis: Secondary | ICD-10-CM

## 2024-04-25 DIAGNOSIS — F840 Autistic disorder: Secondary | ICD-10-CM

## 2024-04-25 DIAGNOSIS — Z23 Encounter for immunization: Secondary | ICD-10-CM

## 2024-04-25 DIAGNOSIS — Z1159 Encounter for screening for other viral diseases: Secondary | ICD-10-CM

## 2024-04-25 DIAGNOSIS — R7889 Finding of other specified substances, not normally found in blood: Secondary | ICD-10-CM

## 2024-04-25 DIAGNOSIS — E7800 Pure hypercholesterolemia, unspecified: Secondary | ICD-10-CM

## 2024-04-25 DIAGNOSIS — L709 Acne, unspecified: Secondary | ICD-10-CM

## 2024-04-25 DIAGNOSIS — F79 Unspecified intellectual disabilities: Secondary | ICD-10-CM

## 2024-04-26 ENCOUNTER — Encounter: Payer: Self-pay | Admitting: Family Medicine

## 2024-04-26 ENCOUNTER — Ambulatory Visit (INDEPENDENT_AMBULATORY_CARE_PROVIDER_SITE_OTHER): Admitting: Family Medicine

## 2024-04-26 VITALS — BP 100/70 | HR 78 | Temp 98.6°F | Ht 68.0 in | Wt 175.9 lb

## 2024-04-26 DIAGNOSIS — G40209 Localization-related (focal) (partial) symptomatic epilepsy and epileptic syndromes with complex partial seizures, not intractable, without status epilepticus: Secondary | ICD-10-CM | POA: Diagnosis not present

## 2024-04-26 DIAGNOSIS — E7800 Pure hypercholesterolemia, unspecified: Secondary | ICD-10-CM

## 2024-04-26 DIAGNOSIS — K219 Gastro-esophageal reflux disease without esophagitis: Secondary | ICD-10-CM | POA: Diagnosis not present

## 2024-04-26 DIAGNOSIS — K5900 Constipation, unspecified: Secondary | ICD-10-CM | POA: Diagnosis not present

## 2024-04-26 DIAGNOSIS — Z79899 Other long term (current) drug therapy: Secondary | ICD-10-CM

## 2024-04-26 DIAGNOSIS — F840 Autistic disorder: Secondary | ICD-10-CM

## 2024-04-26 DIAGNOSIS — Z789 Other specified health status: Secondary | ICD-10-CM

## 2024-04-26 DIAGNOSIS — Z23 Encounter for immunization: Secondary | ICD-10-CM

## 2024-04-26 DIAGNOSIS — R7309 Other abnormal glucose: Secondary | ICD-10-CM | POA: Diagnosis not present

## 2024-04-26 MED ORDER — LINACLOTIDE 72 MCG PO CAPS: 72.0000 ug | ORAL_CAPSULE | Freq: Every day | ORAL | 3 refills | Status: AC

## 2024-04-26 NOTE — Assessment & Plan Note (Signed)
 Chronic, stable.  Will recheck levels today.  The 10-year ASCVD risk score (Arnett DK, et al., 2019) is: 0.1%. Continue lifestyle management.

## 2024-04-26 NOTE — Assessment & Plan Note (Signed)
 Stable.  No acute concerns.  Patient is well taken care of at home by her mother, Lenward Louder, who is her legal guardian.

## 2024-04-26 NOTE — Assessment & Plan Note (Signed)
 Chronic constipation with large, hard stools. Linzess  145 mcg caused abdominal pain. Miralax  restarted but not yet effective. - Discontinued Linzess  145 mcg. - Prescribed Linzess  72 mcg. - Continue Miralax . - Consider adding Colace.

## 2024-04-27 LAB — CARBAMAZEPINE LEVEL, TOTAL: Carbamazepine (Tegretol), S: 12.4 ug/mL (ref 4.0–12.0)

## 2024-04-27 LAB — VITAMIN B12: Vitamin B-12: 444 pg/mL (ref 232–1245)

## 2024-04-27 LAB — COMPREHENSIVE METABOLIC PANEL WITH GFR
ALT: 25 [IU]/L (ref 0–32)
AST: 23 [IU]/L (ref 0–40)
Albumin: 4.1 g/dL (ref 3.9–4.9)
Alkaline Phosphatase: 104 [IU]/L (ref 41–116)
BUN/Creatinine Ratio: 20 (ref 9–23)
BUN: 16 mg/dL (ref 6–24)
Bilirubin Total: <0.2 mg/dL (ref 0.0–1.2)
CO2: 20 mmol/L (ref 20–29)
Calcium: 10 mg/dL (ref 8.7–10.2)
Chloride: 107 mmol/L — ABNORMAL HIGH (ref 96–106)
Creatinine, Ser: 0.81 mg/dL (ref 0.57–1.00)
Globulin, Total: 2.5 g/dL (ref 1.5–4.5)
Glucose: 96 mg/dL (ref 70–99)
Potassium: 4.1 mmol/L (ref 3.5–5.2)
Sodium: 140 mmol/L (ref 134–144)
Total Protein: 6.6 g/dL (ref 6.0–8.5)
eGFR: 93 mL/min/{1.73_m2}

## 2024-04-27 LAB — LIPID PANEL
Chol/HDL Ratio: 2.7 ratio (ref 0.0–4.4)
Cholesterol, Total: 215 mg/dL — ABNORMAL HIGH (ref 100–199)
HDL: 79 mg/dL
LDL Chol Calc (NIH): 129 mg/dL — ABNORMAL HIGH (ref 0–99)
Triglycerides: 37 mg/dL (ref 0–149)
VLDL Cholesterol Cal: 7 mg/dL (ref 5–40)

## 2024-04-27 LAB — TSH RFX ON ABNORMAL TO FREE T4: TSH: 2.61 u[IU]/mL (ref 0.450–4.500)

## 2024-04-27 LAB — HEMOGLOBIN A1C
Est. average glucose Bld gHb Est-mCnc: 100 mg/dL
Hgb A1c MFr Bld: 5.1 % (ref 4.8–5.6)

## 2024-04-27 LAB — HEPATITIS B SURFACE ANTIBODY,QUALITATIVE: Hep B Surface Ab, Qual: NONREACTIVE

## 2024-06-01 ENCOUNTER — Ambulatory Visit (INDEPENDENT_AMBULATORY_CARE_PROVIDER_SITE_OTHER): Admitting: Family

## 2024-10-26 ENCOUNTER — Ambulatory Visit
